# Patient Record
Sex: Female | Born: 1940
Health system: Southern US, Community
[De-identification: ages and names within clinical notes are randomized; demographics above are authoritative.]

## PROBLEM LIST (undated history)

## (undated) DIAGNOSIS — R1310 Dysphagia, unspecified: Secondary | ICD-10-CM

## (undated) DIAGNOSIS — I2510 Atherosclerotic heart disease of native coronary artery without angina pectoris: Secondary | ICD-10-CM

## (undated) DIAGNOSIS — R0902 Hypoxemia: Secondary | ICD-10-CM

## (undated) DIAGNOSIS — Z8739 Personal history of other diseases of the musculoskeletal system and connective tissue: Secondary | ICD-10-CM

## (undated) DIAGNOSIS — R519 Headache, unspecified: Secondary | ICD-10-CM

## (undated) DIAGNOSIS — I219 Acute myocardial infarction, unspecified: Secondary | ICD-10-CM

## (undated) DIAGNOSIS — R51 Headache: Secondary | ICD-10-CM

## (undated) DIAGNOSIS — I739 Peripheral vascular disease, unspecified: Secondary | ICD-10-CM

## (undated) DIAGNOSIS — R55 Syncope and collapse: Secondary | ICD-10-CM

## (undated) DIAGNOSIS — E7800 Pure hypercholesterolemia, unspecified: Secondary | ICD-10-CM

## (undated) DIAGNOSIS — I10 Essential (primary) hypertension: Secondary | ICD-10-CM

## (undated) DIAGNOSIS — N200 Calculus of kidney: Secondary | ICD-10-CM

## (undated) DIAGNOSIS — Z9981 Dependence on supplemental oxygen: Secondary | ICD-10-CM

## (undated) DIAGNOSIS — J42 Unspecified chronic bronchitis: Secondary | ICD-10-CM

## (undated) DIAGNOSIS — D649 Anemia, unspecified: Secondary | ICD-10-CM

## (undated) DIAGNOSIS — R198 Other specified symptoms and signs involving the digestive system and abdomen: Secondary | ICD-10-CM

## (undated) DIAGNOSIS — M069 Rheumatoid arthritis, unspecified: Secondary | ICD-10-CM

## (undated) DIAGNOSIS — I6522 Occlusion and stenosis of left carotid artery: Secondary | ICD-10-CM

## (undated) DIAGNOSIS — J189 Pneumonia, unspecified organism: Secondary | ICD-10-CM

## (undated) DIAGNOSIS — K219 Gastro-esophageal reflux disease without esophagitis: Secondary | ICD-10-CM

## (undated) HISTORY — DX: Other specified symptoms and signs involving the digestive system and abdomen: R19.8

## (undated) HISTORY — PX: DILATION AND CURETTAGE OF UTERUS: SHX78

## (undated) HISTORY — PX: TUBAL LIGATION: SHX77

## (undated) HISTORY — DX: Dysphagia, unspecified: R13.10

## (undated) HISTORY — PX: CATARACT EXTRACTION W/ INTRAOCULAR LENS  IMPLANT, BILATERAL: SHX1307

## (undated) HISTORY — PX: ILIAC ARTERY STENT: SHX1786

## (undated) HISTORY — DX: Headache, unspecified: R51.9

## (undated) HISTORY — PX: YAG LASER APPLICATION: SHX6189

## (undated) HISTORY — PX: CYSTOSCOPY: SUR368

## (undated) HISTORY — DX: Headache: R51

---

## 1997-10-12 HISTORY — PX: CORONARY ANGIOPLASTY WITH STENT PLACEMENT: SHX49

## 1998-09-29 ENCOUNTER — Encounter: Payer: Self-pay | Admitting: Emergency Medicine

## 1998-09-29 ENCOUNTER — Emergency Department (HOSPITAL_COMMUNITY): Admission: EM | Admit: 1998-09-29 | Discharge: 1998-09-29 | Payer: Self-pay | Admitting: Emergency Medicine

## 1998-10-12 HISTORY — PX: SUBCLAVIAN ARTERY STENT: SHX2452

## 1998-10-12 HISTORY — PX: CAROTID ENDARTERECTOMY: SUR193

## 1998-10-15 ENCOUNTER — Encounter: Payer: Self-pay | Admitting: Internal Medicine

## 1998-10-15 ENCOUNTER — Ambulatory Visit (HOSPITAL_COMMUNITY): Admission: RE | Admit: 1998-10-15 | Discharge: 1998-10-15 | Payer: Self-pay | Admitting: Internal Medicine

## 1998-10-22 ENCOUNTER — Other Ambulatory Visit: Admission: RE | Admit: 1998-10-22 | Discharge: 1998-10-22 | Payer: Self-pay | Admitting: Internal Medicine

## 1998-10-29 ENCOUNTER — Ambulatory Visit: Admission: RE | Admit: 1998-10-29 | Discharge: 1998-10-29 | Payer: Self-pay | Admitting: Cardiology

## 1998-11-13 ENCOUNTER — Observation Stay (HOSPITAL_COMMUNITY): Admission: AD | Admit: 1998-11-13 | Discharge: 1998-11-14 | Payer: Self-pay | Admitting: Cardiology

## 1998-12-05 ENCOUNTER — Observation Stay (HOSPITAL_COMMUNITY): Admission: RE | Admit: 1998-12-05 | Discharge: 1998-12-06 | Payer: Self-pay | Admitting: Cardiology

## 1998-12-17 ENCOUNTER — Encounter: Payer: Self-pay | Admitting: Internal Medicine

## 1998-12-17 ENCOUNTER — Ambulatory Visit (HOSPITAL_COMMUNITY): Admission: RE | Admit: 1998-12-17 | Discharge: 1998-12-17 | Payer: Self-pay | Admitting: Internal Medicine

## 1999-02-10 ENCOUNTER — Encounter: Payer: Self-pay | Admitting: Cardiology

## 1999-02-10 ENCOUNTER — Ambulatory Visit (HOSPITAL_COMMUNITY): Admission: RE | Admit: 1999-02-10 | Discharge: 1999-02-10 | Payer: Self-pay | Admitting: Cardiology

## 1999-03-27 ENCOUNTER — Inpatient Hospital Stay: Admission: RE | Admit: 1999-03-27 | Discharge: 1999-03-28 | Payer: Self-pay | Admitting: Vascular Surgery

## 1999-12-17 ENCOUNTER — Ambulatory Visit (HOSPITAL_COMMUNITY): Admission: RE | Admit: 1999-12-17 | Discharge: 1999-12-17 | Payer: Self-pay | Admitting: Internal Medicine

## 1999-12-17 ENCOUNTER — Encounter: Payer: Self-pay | Admitting: Internal Medicine

## 2000-02-24 ENCOUNTER — Encounter: Payer: Self-pay | Admitting: Cardiology

## 2000-02-24 ENCOUNTER — Inpatient Hospital Stay (HOSPITAL_COMMUNITY): Admission: EM | Admit: 2000-02-24 | Discharge: 2000-02-25 | Payer: Self-pay | Admitting: *Deleted

## 2000-07-06 ENCOUNTER — Other Ambulatory Visit: Admission: RE | Admit: 2000-07-06 | Discharge: 2000-07-06 | Payer: Self-pay | Admitting: Internal Medicine

## 2000-08-20 ENCOUNTER — Ambulatory Visit (HOSPITAL_COMMUNITY): Admission: RE | Admit: 2000-08-20 | Discharge: 2000-08-20 | Payer: Self-pay | Admitting: Gastroenterology

## 2000-11-24 ENCOUNTER — Encounter: Admission: RE | Admit: 2000-11-24 | Discharge: 2000-11-24 | Payer: Self-pay | Admitting: Occupational Medicine

## 2000-11-24 ENCOUNTER — Encounter: Payer: Self-pay | Admitting: Occupational Medicine

## 2000-12-21 ENCOUNTER — Ambulatory Visit (HOSPITAL_COMMUNITY): Admission: RE | Admit: 2000-12-21 | Discharge: 2000-12-21 | Payer: Self-pay | Admitting: Internal Medicine

## 2000-12-21 ENCOUNTER — Encounter: Payer: Self-pay | Admitting: Internal Medicine

## 2002-01-09 ENCOUNTER — Other Ambulatory Visit: Admission: RE | Admit: 2002-01-09 | Discharge: 2002-01-09 | Payer: Self-pay | Admitting: Internal Medicine

## 2002-01-09 ENCOUNTER — Ambulatory Visit (HOSPITAL_COMMUNITY): Admission: RE | Admit: 2002-01-09 | Discharge: 2002-01-09 | Payer: Self-pay | Admitting: Internal Medicine

## 2002-01-09 ENCOUNTER — Encounter: Payer: Self-pay | Admitting: Internal Medicine

## 2003-01-16 ENCOUNTER — Encounter: Payer: Self-pay | Admitting: Internal Medicine

## 2003-01-16 ENCOUNTER — Ambulatory Visit (HOSPITAL_COMMUNITY): Admission: RE | Admit: 2003-01-16 | Discharge: 2003-01-16 | Payer: Self-pay | Admitting: Internal Medicine

## 2003-05-23 ENCOUNTER — Emergency Department (HOSPITAL_COMMUNITY): Admission: EM | Admit: 2003-05-23 | Discharge: 2003-05-23 | Payer: Self-pay | Admitting: Emergency Medicine

## 2004-01-30 ENCOUNTER — Ambulatory Visit (HOSPITAL_COMMUNITY): Admission: RE | Admit: 2004-01-30 | Discharge: 2004-01-30 | Payer: Self-pay | Admitting: Internal Medicine

## 2004-02-18 ENCOUNTER — Other Ambulatory Visit: Admission: RE | Admit: 2004-02-18 | Discharge: 2004-02-18 | Payer: Self-pay | Admitting: Internal Medicine

## 2004-03-18 ENCOUNTER — Ambulatory Visit (HOSPITAL_COMMUNITY): Admission: RE | Admit: 2004-03-18 | Discharge: 2004-03-18 | Payer: Self-pay | Admitting: Orthopedic Surgery

## 2004-03-18 ENCOUNTER — Ambulatory Visit (HOSPITAL_BASED_OUTPATIENT_CLINIC_OR_DEPARTMENT_OTHER): Admission: RE | Admit: 2004-03-18 | Discharge: 2004-03-18 | Payer: Self-pay | Admitting: Orthopedic Surgery

## 2005-02-05 ENCOUNTER — Ambulatory Visit (HOSPITAL_COMMUNITY): Admission: RE | Admit: 2005-02-05 | Discharge: 2005-02-05 | Payer: Self-pay | Admitting: Internal Medicine

## 2006-02-08 ENCOUNTER — Ambulatory Visit (HOSPITAL_COMMUNITY): Admission: RE | Admit: 2006-02-08 | Discharge: 2006-02-08 | Payer: Self-pay | Admitting: Internal Medicine

## 2007-02-15 ENCOUNTER — Ambulatory Visit (HOSPITAL_COMMUNITY): Admission: RE | Admit: 2007-02-15 | Discharge: 2007-02-15 | Payer: Self-pay | Admitting: Cardiology

## 2007-05-31 ENCOUNTER — Encounter: Admission: RE | Admit: 2007-05-31 | Discharge: 2007-05-31 | Payer: Self-pay | Admitting: Internal Medicine

## 2007-10-13 HISTORY — PX: FEMORAL ARTERY STENT: SHX1583

## 2008-02-28 ENCOUNTER — Ambulatory Visit (HOSPITAL_COMMUNITY): Admission: RE | Admit: 2008-02-28 | Discharge: 2008-02-28 | Payer: Self-pay | Admitting: Internal Medicine

## 2008-05-15 ENCOUNTER — Ambulatory Visit (HOSPITAL_COMMUNITY): Admission: RE | Admit: 2008-05-15 | Discharge: 2008-05-16 | Payer: Self-pay | Admitting: Cardiology

## 2009-02-06 ENCOUNTER — Other Ambulatory Visit: Admission: RE | Admit: 2009-02-06 | Discharge: 2009-02-06 | Payer: Self-pay | Admitting: Internal Medicine

## 2009-02-18 ENCOUNTER — Ambulatory Visit (HOSPITAL_COMMUNITY): Admission: EM | Admit: 2009-02-18 | Discharge: 2009-02-18 | Payer: Self-pay | Admitting: Emergency Medicine

## 2009-03-06 ENCOUNTER — Ambulatory Visit (HOSPITAL_COMMUNITY): Admission: RE | Admit: 2009-03-06 | Discharge: 2009-03-06 | Payer: Self-pay | Admitting: Internal Medicine

## 2009-05-17 ENCOUNTER — Ambulatory Visit: Payer: Self-pay | Admitting: Vascular Surgery

## 2009-12-06 ENCOUNTER — Ambulatory Visit: Payer: Self-pay | Admitting: Vascular Surgery

## 2010-03-12 ENCOUNTER — Ambulatory Visit (HOSPITAL_COMMUNITY): Admission: RE | Admit: 2010-03-12 | Discharge: 2010-03-12 | Payer: Self-pay | Admitting: Internal Medicine

## 2010-08-14 ENCOUNTER — Ambulatory Visit: Payer: Self-pay | Admitting: Vascular Surgery

## 2011-02-06 ENCOUNTER — Other Ambulatory Visit (HOSPITAL_COMMUNITY): Payer: Self-pay | Admitting: Internal Medicine

## 2011-02-06 DIAGNOSIS — Z1231 Encounter for screening mammogram for malignant neoplasm of breast: Secondary | ICD-10-CM

## 2011-02-24 NOTE — Consult Note (Signed)
NEW PATIENT CONSULTATION   [name], [name]  DOB:  17-May-1941                                       05/17/2009  ZOXWR#:[id number]   The patient presents today for evaluation of asymptomatic carotid  stenosis.  She is well known to me from a prior left carotid  endarterectomy in 2000.  She has had several followup duplex scans in  the past showing moderate stenosis in her left endarterectomy site.  We  are seeing her for further discussion of this.  Most recently, duplex at  [facility] on 02/26/2009 showed a 60%-79% stenosis in her left  carotid.  She denies any focal neurologic deficits.  She does report  some right arm fatigue over the past month, but no focal deficits  related to this.  She does have a long history of peripheral vascular  disease with left iliac artery stenting.  Does have a history of prior  myocardial infarction, hypertension, elevated cholesterol.   FAMILY HISTORY:  Significant for sudden death in her father at age 59.   SOCIAL HISTORY:  She is widowed.  She is a retired Engineer, civil (consulting).  She quit  smoking in 2000.  Does not drink alcohol.   REVIEW OF SYSTEMS:  GENERAL:  Positive for weight gain up to 155 pounds.  She is 5 feet 4 inches tall.  She does have home oxygen at night.  GI:  Is positive for esophageal reflux and hiatal hernia.  ORTHOPEDICS:  For arthritis.  EYES:  She has had cataract surgery in the past.   She has multiple medication allergies and multiple medications, which  are listed in her chart.   PHYSICAL EXAM:  Well-developed white female appearing stated age of 70.  Blood pressure is 114/70 in her left arm and 113/65 in her right arm.  Pulse 78, respirations 18.  She has a well-healed left carotid incision  and she does have soft carotid bruits bilaterally.  She has palpable  radial pulses and she is grossly intact neurologically.   Hopkins reviewed her duplex with her.  Hopkins explained that since she is  asymptomatic we would  recommend followup only and plan to see her again  in 6 months for repeat carotid duplex at that time.  She will notify us  immediately should she develop focal neurologic deficits.   Larina Earthly, M.D.  Electronically Signed   TFE/MEDQ  D:  05/17/2009  T:  05/20/2009  Job:  [id number]   cc:   Massie Maroon, MD

## 2011-02-24 NOTE — Procedures (Signed)
CAROTID DUPLEX EXAM   INDICATION:  Carotid disease.   HISTORY:  Diabetes:  No.  Cardiac:  No.  Hypertension:  Yes.  Smoking:  Previous.  Previous Surgery:  Left carotid endarterectomy in 2000.  CV History:  Currently asymptomatic.  Amaurosis Fugax No, Paresthesias No, Hemiparesis No                                       RIGHT             LEFT  Brachial systolic pressure:         126               102  Brachial Doppler waveforms:         Normal            Atypical  Vertebral direction of flow:        Antegrade         Hesitant  DUPLEX VELOCITIES (cm/sec)  CCA peak systolic                   72                P=87/D=238  ECA peak systolic                   114               75  ICA peak systolic                   74                257  ICA end diastolic                   22                56  PLAQUE MORPHOLOGY:                  Mixed             Homogeneous  PLAQUE AMOUNT:                      Mild              Moderate / severe  PLAQUE LOCATION:                    ICA / ECA / CCA   Distal CCA   IMPRESSION:  1. 1%-39% stenosis of the right internal carotid artery.  2. Increased velocities noted in the left proximal internal carotid      artery (as described above), however, this appears to be a result      of the significant stenosis of the proximal carotid endarterectomy      site in the left distal common carotid artery.  3. Significant difference in the bilateral brachial pressures noted      with a hesitant left vertebral artery waveform. This is suggestive      of a possible left subclavian artery stenosis.   ___________________________________________  Larina Earthly, M.D.   CH/MEDQ  D:  12/06/2009  T:  12/06/2009  Job:  [id number]

## 2011-02-24 NOTE — Procedures (Signed)
CAROTID DUPLEX EXAM   INDICATION:  Carotid stenosis.   HISTORY:  Diabetes:  No.  Cardiac:  No.  Hypertension:  Yes.  Smoking:  Previously.  Previous Surgery:  Left carotid endarterectomy in 2000.  CV History:  Currently asymptomatic.                                        RIGHT             LEFT  Brachial systolic pressure:         122               107  Brachial Doppler waveforms:         Normal            Normal  Vertebral direction of flow:        Antegrade         Atypical  DUPLEX VELOCITIES (cm/sec)  CCA peak systolic                   83                M = 80, D = 280  ECA peak systolic                   97                60  ICA peak systolic                   80                121  ICA end diastolic                   27                26  PLAQUE MORPHOLOGY:                  Mixed             Homogenous  PLAQUE AMOUNT:                      Mild              Moderate/severe  PLAQUE LOCATION:                    ICA               CCA, bifurcation   IMPRESSION:  1. Right internal carotid artery velocities suggest 1% to 39%      stenosis.  2. Left internal carotid artery velocities suggest 1% to 39% stenosis.  3. Left distal common carotid artery stenosis.  4. Left vertebral artery early systolic deceleration.       ___________________________________________  Larina Earthly, M.D.   EM/MEDQ  D:  08/14/2010  T:  08/14/2010  Job:  [id number]

## 2011-02-24 NOTE — Cardiovascular Report (Signed)
NAMECRISTYN, Hopkins                ACCOUNT NO.:  [id number]   MEDICAL RECORD NO.:  [id number]          PATIENT TYPE:  OIB   LOCATION:  2622                         FACILITY:  [facility]   PHYSICIAN:  Cristy Hilts. Jacinto Halim, MD       DATE OF BIRTH:  Feb 02, 1941   DATE OF PROCEDURE:  05/15/2008  DATE OF DISCHARGE:                            CARDIAC CATHETERIZATION   PROCEDURE PERFORMED:  1. Pelvic aortogram.  2. Pelvic aortogram with bifemoral runoff.  3. PTA and stenting of the left common iliac artery.   INDICATIONS:  [name] is a 70 year old female with known  peripheral arterial disease and coronary artery disease.  She has  undergone remote stenting to her right SFA in the past and balloon  angioplasty of the lower extremities in the past, details of which are  not fully available to me.  She has been complaining of lifestyle-  limiting left hip claudication.  She had undergone outpatient Doppler  evaluation of the lower extremities and this had revealed a possibility  of high-grade stenosis of the left common iliac artery.  Hence, she was  referred to me by Dr. Krista Blue for evaluation of peripheral arterial  disease with possible eye towards percutaneous revascularization.   Pelvic aortogram:  Pelvic aortogram revealed severe atherosclerotic  changes of the abdominal aorta.  There was moderate to heavy  calcification of the abdominal aorta and also bilateral common iliac  arteries.   The left iliac artery showed a high-grade 80% to 90% calcific stenosis.  There is mild luminal irregularity of the left external iliac artery and  the left common femoral artery and superficial femoral artery were  within normal limits.  Below the left knee, there was three-vessel  runoff without any evidence of small-vessel disease.   On the right side, right common iliac artery showed ostial 30%-40%  stenosis and mild luminal irregularity of right common iliac artery.   The right femoral artery and  below the right knee there was three-vessel  runoff and right femoral artery was also normal and previously placed  stent in the right SFA just outside of the Hunter's canal is widely  patent.   INTERVENTIONAL DATA:  Successful PTA and stenting of the left common  iliac artery with implantation of 8.0 x 39 mm Smart balloon expandable  stent, which was deployed at 10 atmospheric pressure.  This stenosis was  reduced from 90% to 0% with excellent results.  There was a retrograde  dissection noted at the distal abdominal aorta, which is  inconsequential.   Careful pullback was performed through the abdominal aorta into the left  femoral artery and there was no pressure gradient noted across the stent  or distal to the stent.   A total of 190 mL of contrast was utilized for diagnostic and  interventional procedure.   RECOMMENDATIONS:  The patient will need a Plavix for at least a period  of 4 to 6 weeks and aspirin indefinitely.  She will follow up with Dr.  Krista Blue for further management and evaluation in 2 to 3 weeks.   TECHNIQUE  OF THE PROCEDURE:  Under usual sterile precautions using a 5-  French left femoral arterial access, a 5-French Omniflush catheter was  advanced into the left common iliac artery.  I had difficulty in  crossing the common iliac artery stenosis.  Hence a femoral arteriogram  was performed through the arterial access sheath.  Then with careful  manipulation of the catheter, I was able to cross through the high-grade  calcific stenosis of the left common iliac artery and placed the [facility]  wire in the abdominal aorta.  Distal abdominal aortogram was performed  and distal abdominal aortogram with bifemoral runoff was also performed.   TECHNIQUE OF INTERVENTION:  Using heparin for anticoagulation  maintaining ACT greater than 200, I exchanged 5-French sheath to a 7-  French left femoral arterial access sheath.  Then, the lesion was  predilated because of inability  to cross the stenosis with the  introducer sheath.  I had to predilate this with a 6.0 x 40-mm Fox cross  balloon at 12 atmospheric pressure for 50 seconds.  Following this, I  was able to easily advance the 45-cm long sheath into the distal  abdominal aorta.  Distal abdominal aortogram was performed.  Then after  carefully mapping the landmark an 8.0 x 39-mm Genesis balloon expandable  stent was placed at the appropriate position and deployed at 10  atmospheric pressure.  Angiography was repeated and then distal  abdominal aortogram was repeated and the catheters were pulled out of  body in the usual fashion.  The long sheath was pulled out and exchanged  to a short sheath and left femoral arterial access site and the patient  was transferred to the holding area in stable in stable condition.      Cristy Hilts. Jacinto Halim, MD  Electronically Signed     JRG/MEDQ  D:  05/15/2008  T:  05/16/2008  Job:  [id number]

## 2011-02-24 NOTE — Op Note (Signed)
NAMEDECLAN, [name]                ACCOUNT NO.:  [id number]   MEDICAL RECORD NO.:  [id number]          PATIENT TYPE:  OBV   LOCATION:  0105                         FACILITY:  [facility]   PHYSICIAN:  Petra Kuba, M.D.    DATE OF BIRTH:  August 02, 1941   DATE OF PROCEDURE:  02/18/2009  DATE OF DISCHARGE:                               OPERATIVE REPORT   PROCEDURE PERFORMED:  Esophagogastroduodenoscopy with food disimpaction.   ENDOSCOPIST:  Petra Kuba, M.D.   INDICATIONS FOR PROCEDURE:  Patient with obvious food impaction.  Consent was signed after the risks, benefits, methods and options were  thoroughly discussed multiple times in the past.   MEDICINES USED:  Fentanyl 100 mg, Versed 10 mg.   DESCRIPTION OF PROCEDURE:  The video endoscope was inserted by direct  vision.  In the proximal esophagus was some obvious food.  We initially  tried to get the Lucina Mellow net around it, but could not advance it around the  sides, little pieces were removed.  Frequently, we would have bigger  pieces, but when we tried to pull it through the upper esophageal  sphincter it would break into smaller pieces.  We then switched to the  snare and again small pieces were removed.  Multiple insertions and  removals were done.  We then switched to the 40-pronged grabber but  again met the same problem as we would grab the large piece, it would  break into smaller pieces in pulling it through the upper esophageal  sphincter.  We did try to switch back to the [facility] net again, with only  being able to remove small pieces.  When we switched back to the 4  pronged grabber in trying to grab a piece, it seemed to push it down  slightly into the esophagus and then the bolus was able to fall on its  own into the stomach.  The scope followed the bolus into the stomach.  There was trauma at the proximal to mid esophagus where the food was,  but no obvious stricture or ring.  She did have a small hiatal hernia.  Scope passed  into the stomach and there was no extra water or food  there, and advanced to a normal antrum and normal pylorus and advanced  into a normal duodenal bulb and around the C-loop to a normal second  portion of the duodenum.  The scope was withdrawn back to the bulb and a  good look there ruled out abnormalities in that location.  Scope was  withdrawn back to the stomach and retroflexed.  High in the cardia the  hiatal hernia was confirmed.  The angularis, lesser and greater curve,  cardia and fundus were normal on retroflex visualization.  Straight  visualization of the stomach did not reveal any additional findings.  Again a good look at the esophagus confirmed there was no more food and  did confirm some mild trauma in the proximal to mid esophagus.  The  scope was removed.  The patient tolerated the procedure adequately.  There were no obvious immediate complication.   ENDOSCOPIC  DIAGNOSES:  1. Food impaction in the mid to proximal esophagus, status post      multiple pieces removed with the snare of the [employer] and 4-      Paramedic.  2. Finally dislodged the bolus with the 4-prong and down to the      stomach.  3. Small hiatal hernia.  4. Otherwise normal esophagogastroduodenoscopy.   PLAN:  Clear liquids and then advance to soft solids tomorrow.  Hold  nonsteroidals for a few days but okay to continue her aspirin and will  get some over-the-counter Prilosec and tell them to take it twice a day  for 5 days and follow up p.r.n.           ______________________________  Petra Kuba, M.D.     MEM/MEDQ  D:  02/18/2009  T:  02/19/2009  Job:  [id number]   cc:   Antionette Char, MD  Fax: [phone]

## 2011-02-27 NOTE — Discharge Summary (Signed)
NAMEMARCO, [name]                ACCOUNT NO.:  [id number]   MEDICAL RECORD NO.:  [id number]          PATIENT TYPE:  OIB   LOCATION:  2622                         FACILITY:  [facility]   PHYSICIAN:  Cristy Hilts. Jacinto Halim, MD       DATE OF BIRTH:  Aug 17, 1941   DATE OF ADMISSION:  05/15/2008  DATE OF DISCHARGE:  05/16/2008                               DISCHARGE SUMMARY   DISCHARGE DIAGNOSES:  1. Peripheral vascular disease.      a.     Percutaneous transluminal angioplasty and stenting of high-       grade calcific common left iliac artery.  2. Claudication in left hip.  3. Abnormal Doppler studies.  4. History of coronary artery disease.  5. History of carotid artery disease.  6. Hyperlipidemia.  7. Osteopenia.   DISCHARGE CONDITION:  Improved.   PROCEDURES:  PV angiogram of lower extremities on May 15, 2008, by Dr.  Yates Decamp.   On May 15, 2008, percutaneous transluminal angioplasty and stent  deployment to the left iliac artery secondary to stenosis by Dr. Jacinto Halim.   DISCHARGE MEDICATIONS:  1. Lescol XL 80 mg daily.  2. Cozaar 50 mg daily.  3. Zebeta 5 mg daily.  4. Evista 60 mg daily.  5. Enteric-coated aspirin 2 daily.  6. Vitamin for her eye 1 daily.  7. Calcium citrate and vitamin D 600 mg twice a day.  8. Fish oil 1000 mg 2 tablets at bedtime.  9. Tylenol 500 mg as needed.  10.Plavix 75 mg daily.   DISCHARGE INSTRUCTIONS:  1. Follow up with Dr. Aleen Campi in 2 weeks.  Call for an appointment.  2. Increase activity slowly.  3. May shower.  No lifting for 3 days.  4. No driving for 3 days.   HISTORY OF PRESENT ILLNESS:  A 70 year old patient of Dr. Aleen Campi had  complained of claudication.  She underwent ultrasound on May 17, 2008,  showing moderate-to-severe stenosis in the left common iliac artery.  Dr. Aleen Campi consulted with Dr. Yates Decamp and felt that PV angiogram and  stent would help alleviate her symptoms.   The patient's past medical history does include:  1.  Coronary disease.  2. Hyperlipidemia.  3. Osteopenia.   The patient was brought to the [facility] at [facility] on May 15, 2008,  and underwent peripheral arteriogram and PTA and stent to the left iliac  artery successfully without complications.  By May 16, 2008, she was  stable and ready for discharge home.  Blood pressure 112/62, pulse 76,  respiratory 12, sats on room air 96%.  She will follow up with Dr.  Aleen Campi.   Please note, I am dictating this for Dr. Jacinto Halim and Raymon Mutton, PA,  secondary to records were not yet completed, and [name] is no  longer available for the dictation.      Darcella Gasman. [place], N.P.      Cristy Hilts. Jacinto Halim, MD  Electronically Signed    LRI/MEDQ  D:  09/27/2008  T:  09/28/2008  Job:  [id number]   cc:   Jonny Ruiz  Felipe Drone, MD

## 2011-02-27 NOTE — H&P (Signed)
[facility]  Patient:    [name], [name]                   MRN: [id number] Adm. Date:  [id number] Attending:  Silvestre Mesi Dictator:   Donzetta Matters, P.A.                         History and Physical  CHIEF COMPLAINT: Jaw pain.  HISTORY OF PRESENT ILLNESS: This patient is a 70 year old female who states she has had nausea and pain today, this a toothache-type pain that is almost constant and associated with activity and ache of the left arm.  She feels squeezing mid chest pain down the sternum.  It does come and go.  It radiates to the left neck, jaw, and shoulder.  She has moderate chest pain.  She did have an instance of pain with nausea while driving here today and states the pain was bad for about ten minutes.  It has awakened her at night several times.  She had one spell similar to this on Sunday.  She states it is similar to previous angina.  She does have a history of a stent in her right coronary artery but states this was not as severe like prior to her catheterization. She did take aspirin and the pain went away for a while.  FAMILY HISTORY: Father died at age 31 of MI.  Mother is ill with diabetes and complications.  SOCIAL HISTORY: She is a widow.  She does drink caffeine.  She is a former smoker now.  PAST MEDICAL HISTORY:  1. Peripheral vascular disease.  2. Coronary artery disease.  3. Carotid artery with endarterectomy.  4. Status post MI.  5. History of smoking.  6. Hard of hearing.  7. Does have arthritis.  REVIEW OF SYSTEMS: She has no fever or chills.  She has had some sweats at night.  Weight has increased some.  She has noted some edema to her hands. Chest pain as noted in the HPI.  No palpitations.  No dyspnea.  She does sleep on three pillows.  She does have some cough but no sputum production.  She does have some occasional nausea on the date of admission.  Some heartburn. She does have constipation at  times.  She denies any urinary problems.  She does get up one time at night for nocturia.  She does have some fatigue but gait is steady.  She denies any dizziness, syncope, or faintness.  No memory changes.  No paresthesias except for today in the left arm.  PHYSICAL EXAMINATION:  VITAL SIGNS: Weight 150 pounds.  Blood pressure 148/68, pulse 64, respirations 16.  She is afebrile.  GENERAL: Well-developed, well-nourished white female, at the present time with good nutrition.  Well cared for.  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  She is oriented to person, place, time, and situation, with good judgment.  NECK: Does have right bruit 2-3/6.  No JVD.  No thyromegaly.  CHEST: Clear.  HEART: Regular rate and rhythm, no obvious murmurs.  ABDOMEN: Soft, flat, active bowel sounds, nontender.  No abdominal bruits.  HEENT: PERRL.  EOMI.  Does have prescription lenses.  Does have partial upper and lower dentures in.  Otherwise pink and moist mucosa noted.  NEUROLOGIC: Grossly nonfocal.  EXTREMITIES: No edema, normal pedal pulses.  No clubbing or cyanosis or rash noted to the skin.  IMPRESSION: Admission to telemetry at [facility] to start on anticoagulation and antiplatelet therapy.  Nitroglycerin drip.  Obtain serial enzymes and schedule for heart catheterization in the morning.  DIAGNOSES:  1. Coronary artery disease with unstable angina.  2. Peripheral vascular disease. DD:  02/25/00 TD:  02/25/00 Job: [id number]

## 2011-02-27 NOTE — Op Note (Signed)
NAME:  [name], [name]                          ACCOUNT NO.:  [id number]   MEDICAL RECORD NO.:  [id number]                   PATIENT TYPE:  AMB   LOCATION:  DSC                                  FACILITY:  [facility]   PHYSICIAN:  Cindee Salt, M.D.                    DATE OF BIRTH:  20-Apr-1941   DATE OF PROCEDURE:  03/18/2004  DATE OF DISCHARGE:                                 OPERATIVE REPORT   PREOPERATIVE DIAGNOSIS:  Cat bite with infection, left forearm.   POSTOPERATIVE DIAGNOSIS:  Cat bite with infection, left forearm.   OPERATION:  Incision and drainage, left forearm.   SURGEON:  Cindee Salt, M.D.   ASSISTANTCarolyne Fiscal.   ANESTHESIA:  General.   HISTORY:  The patient is a 69 year old female who suffered a cat bite  approximately two days ago.  She has been placed on Augmentin.  This has  continued to increase in erythema and pain.   PROCEDURE:  The patient was brought to the operating room, where a general  anesthetic was carried out without difficulty.  She was prepped using  Duraprep in supine position, left arm free.  The limb was elevated for  exsanguination, tourniquet placed high, and the arm was inflated to 250  mmHg.  The incisions were made over each of the puncture wounds.  Cultures  were taken.  These were carried down to the depths.  This was then irrigated  clear.  Each was packed open.  A sterile compressive dressing was applied.  The patient tolerated the procedure well and was taken to the recovery room  for observation in satisfactory condition.  She was given 3 g of Unasyn in  the operating room, and she is discharged on Augmentin and Vicodin.  She  will return in two days for therapy.                                               Cindee Salt, M.D.    Angelique Blonder  D:  03/18/2004  T:  03/19/2004  Job:  [id number]

## 2011-02-27 NOTE — Cardiovascular Report (Signed)
[place]  Patient:    [name], [name]                   MRN: [id number] Proc. Date: 02/25/00 Adm. Date:  [id number] Disc. Date: [id number] Attending:  Silvestre Mesi CC:         Jaclyn Prime. Lucas Mallow, M.D.             John R. Aleen Campi, M.D.                        Cardiac Catheterization  REFERRING PHYSICIAN:  Jaclyn Prime. Lucas Mallow, M.D.  PROCEDURES: 1. Left heart catheterization. 2. Coronary cineangiography. 3. Right and left subclavian artery cineangiography. 4. Left ventricular cineangiography. 5. Perclose of the right femoral artery.  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  She had an emergency angioplasty with stent placement in the proximal right coronary artery.  She was also noted to have mild to moderate stenosis in her right subclavian artery, and severe stenosis in her left proximal subclavian artery. She later had stent placement in her left subclavian lesion.  She now returns with hospitalization on Feb 24, 2000 for chest pain, and was scheduled for repeat cardiac catheterization to assess her coronary status.  DESCRIPTION OF PROCEDURE:  After signing an informed consent, the patient was premedicated with 50 mg of Benadryl intravenously and brought to the cardiac catheterization lab.  The right groin was prepped and draped in a sterile fashion, and anesthetized locally with 1% lidocaine.  Six-French introducer sheath was inserted percutaneously into the right femoral artery.  Then, 6-French #4 Judkins coronary catheters were used to make injections into the coronary arteries.  The right coronary catheter was used to make injections into the right subclavian and left subclavian arteries.  A 6-French pigtail catheter was used to measure pressures in the left  ventricle and aorta, and to make midstream injections into the left ventricle.  The patient tolerated the procedure well and no complications were noted.  At the end of the procedure. The catheters and sheath were removed from the right femoral artery, and hemostasis was easily obtained with a Perclose closure system.  MEDICATIONS GIVEN:  Versed 1 mg IV x 2.  HEMODYNAMIC DATA: 1. Left ventricular pressure:  144/0-17. 2. Aortic pressure:  143/61, with a mean of 94. 3. Left ventricular ejection fraction:  Estimated at 60-70%.  CINEANGIOGRAPHIC FINDINGS: 1. Left Coronary Artery:  The ostium and left main appear normal. 2. Left Anterior Descending:  Appears normal. 3. Circumflex Coronary Artery:  Appears normal. This is a small, nondominant    vessel. 4. Right Coronary Artery:  A large dominant vessel supplying the posterior    descending and posterolateral circulation.  The ostium appears normal.    The proximal segment is well visualized, showing the stented area, and now    appears to be essentially normal with a very normal appearance during the    contrast injections; and with very normal antegrade flow throughout the    right coronary system.  There is a mild stenotic plaque in the middle    segment before the acute angle, with a less than 20% stenosis.  This    appears smooth and unchanged from her prior study in February 2001.  LEFT SUBCLAVIAN CINEANGIOGRAPHY:  Shows proper positioning of  the stent in the proximal segment, now with a normal appearance and 0% restenosis.  There is very normal flow into the distal subclavian.  The left vertebral artery has a 60-70% ostial lesion.  The left internal mammary artery appears normal.  The right subclavian has a focal eccentric stenosis in its proximal segment. There is mild post-stenotic dilatation, followed by normal-appearing mid and distal subclavian on the right.  The proximal common carotid artery appears normal, and the  innominate artery appears normal.  There is a calcified plaque in the proximal right subclavian.  LEFT VENTRICULAR CINEANGIOGRAM:  The left ventricular chamber size and contractility appear normal.  The left ventricular contractility was estimated at 60-70%.  The mitral and aortic valves appear normal.  FINAL DIAGNOSES: 1. History of single-vessel coronary artery disease. 2. Very good long-term appearance of the proximal right coronary artery stent    site, and no new coronary lesions. 3. Normal left coronary artery. 4. Very good appearance of the left subclavian stent. 5. Normal left ventricular function. 6. Normal mitral and aortic valves. 7. Successful Perclose of the right femoral artery.   DISPOSITION:  Will transfer the patient to the short-stay unit and anticipate discharge later today.  Will not continue the heparin or nitroglycerin drip. DD:  02/25/00 TD:  02/26/00 Job: [id number]

## 2011-03-17 ENCOUNTER — Ambulatory Visit (HOSPITAL_COMMUNITY)
Admission: RE | Admit: 2011-03-17 | Discharge: 2011-03-17 | Disposition: A | Discharge status: Home / Self Care | Payer: Medicare Other | Source: Ambulatory Visit | Attending: Internal Medicine | Admitting: Internal Medicine

## 2011-03-17 DIAGNOSIS — Z1231 Encounter for screening mammogram for malignant neoplasm of breast: Secondary | ICD-10-CM | POA: Insufficient documentation

## 2011-07-10 LAB — CBC
MCHC: 34
Platelets: 327
RBC: 4.07
WBC: 6.9

## 2011-07-10 LAB — BASIC METABOLIC PANEL
BUN: 9
Calcium: 9
Creatinine, Ser: 0.74
GFR calc Af Amer: >60

## 2011-12-03 DIAGNOSIS — I70219 Atherosclerosis of native arteries of extremities with intermittent claudication, unspecified extremity: Secondary | ICD-10-CM | POA: Diagnosis not present

## 2011-12-03 DIAGNOSIS — I2510 Atherosclerotic heart disease of native coronary artery without angina pectoris: Secondary | ICD-10-CM | POA: Diagnosis not present

## 2011-12-03 DIAGNOSIS — Z9861 Coronary angioplasty status: Secondary | ICD-10-CM | POA: Diagnosis not present

## 2011-12-03 DIAGNOSIS — I10 Essential (primary) hypertension: Secondary | ICD-10-CM | POA: Diagnosis not present

## 2011-12-20 ENCOUNTER — Emergency Department (INDEPENDENT_AMBULATORY_CARE_PROVIDER_SITE_OTHER)
Admission: EM | Admit: 2011-12-20 | Discharge: 2011-12-20 | Disposition: A | Discharge status: Home / Self Care | Payer: Medicare Other | Source: Home / Self Care | Attending: Family Medicine | Admitting: Family Medicine

## 2011-12-20 ENCOUNTER — Encounter (HOSPITAL_COMMUNITY): Payer: Self-pay

## 2011-12-20 ENCOUNTER — Emergency Department (INDEPENDENT_AMBULATORY_CARE_PROVIDER_SITE_OTHER): Payer: Medicare Other

## 2011-12-20 DIAGNOSIS — M76899 Other specified enthesopathies of unspecified lower limb, excluding foot: Secondary | ICD-10-CM

## 2011-12-20 DIAGNOSIS — M25559 Pain in unspecified hip: Secondary | ICD-10-CM | POA: Diagnosis not present

## 2011-12-20 DIAGNOSIS — M7072 Other bursitis of hip, left hip: Secondary | ICD-10-CM

## 2011-12-20 HISTORY — DX: Essential (primary) hypertension: I10

## 2011-12-20 HISTORY — DX: Pure hypercholesterolemia, unspecified: E78.00

## 2011-12-20 HISTORY — DX: Atherosclerotic heart disease of native coronary artery without angina pectoris: I25.10

## 2011-12-20 MED ORDER — TRAMADOL HCL 50 MG PO TABS: 50.0000 mg | ORAL_TABLET | Freq: Four times a day (QID) | ORAL | Status: AC | PRN

## 2011-12-20 NOTE — ED Provider Notes (Signed)
History     CSN: [id number]  Arrival date & time 12/20/11  1331   First MD Initiated Contact with Patient 12/20/11 1334      Chief Complaint  Patient presents with  . Hip Pain    (Consider location/radiation/quality/duration/timing/severity/associated sxs/prior treatment) Patient is a 71 y.o. female presenting with hip pain. The history is provided by the patient.  Hip Pain This is a new problem. The current episode started more than 1 week ago (for 1 month , sx gradually getting worse.). The problem has been gradually worsening. Exacerbated by: worse getting up and down.    Past Medical History  Diagnosis Date  . Coronary artery disease   . Hypercholesteremia   . Hypertension     Past Surgical History  Procedure Date  . Tubal ligation   . Angioplasty   . Carotid endarterectomy   . Eye surgery     History reviewed. No pertinent family history.  History  Substance Use Topics  . Smoking status: Never Smoker   . Smokeless tobacco: Not on file  . Alcohol Use: No    OB History    Grav Para Term Preterm Abortions TAB SAB Ect Mult Living                  Review of Systems  Constitutional: Negative.   Gastrointestinal: Negative.   Genitourinary: Negative.   Musculoskeletal: Positive for arthralgias.    Allergies  Altace; Ivp dye; Macrodantin; Mobic; Motrin; Naprosyn; Neosporin; Niaspan; Pletal; Prednisone; Sulfa antibiotics; and Vioxx  Home Medications   Current Outpatient Rx  Name Route Sig Dispense Refill  . PROMISEB EX CREA Apply externally Apply topically.    . ASPIRIN 81 MG PO TABS Oral Take 81 mg by mouth daily.    Marland Kitchen BISOPROLOL FUMARATE 5 MG PO TABS Oral Take 5 mg by mouth daily.    Marland Kitchen CALTRATE 600 PO Oral Take by mouth.    . EZETIMIBE 10 MG PO TABS Oral Take 10 mg by mouth daily.    Marland Kitchen FLUVASTATIN SODIUM ER 80 MG PO TB24 Oral Take 80 mg by mouth daily.    Marland Kitchen LOSARTAN POTASSIUM 50 MG PO TABS Oral Take 50 mg by mouth daily.    . ICAPS MV PO Oral Take  by mouth.    Marland Kitchen FISH OIL 1000 MG PO CPDR Oral Take by mouth.    Marland Kitchen RALOXIFENE HCL 60 MG PO TABS Oral Take 60 mg by mouth daily.    Marland Kitchen VITAMIN C 250 MG PO TABS Oral Take 250 mg by mouth daily.    Marland Kitchen VITAMIN D (ERGOCALCIFEROL) 50000 UNITS PO CAPS Oral Take 50,000 Units by mouth.    . TRAMADOL HCL 50 MG PO TABS Oral Take 1 tablet (50 mg total) by mouth every 6 (six) hours as needed for pain. 20 tablet 0    BP 134/71  Pulse 63  Temp(Src) 98.4 F (36.9 C) (Oral)  Resp 16  SpO2 95%  Physical Exam  Nursing note and vitals reviewed. Constitutional: She is oriented to person, place, and time. She appears well-developed and well-nourished.  Abdominal: Soft. Bowel sounds are normal.  Musculoskeletal: She exhibits tenderness. She exhibits no edema.       Legs: Neurological: She is alert and oriented to person, place, and time.  Skin: Skin is warm and dry.    ED Course  Procedures (including critical care time)  Labs Reviewed - No data to display Dg Hip Complete Left  12/20/2011  *RADIOLOGY REPORT*  Clinical Data: Left hip pain.  LEFT HIP - COMPLETE 2+ VIEW  Comparison: None  Findings: Both hips are normally located.  No acute fracture or evidence of avascular necrosis.  Minimal degenerative changes for age.  Extensive vascular calcifications are noted.  The pubic symphysis and SI joints are intact.  No pelvic fractures. Advanced degenerative changes in the lower lumbar spine.  IMPRESSION: No acute bony findings or significant degenerative changes.  Original Report Authenticated By: P. Loralie Champagne, M.D.     1. Bursitis of left hip       MDM  X-rays reviewed and report per radiologist.         Linna Hoff, MD 12/20/11 [phone]

## 2011-12-20 NOTE — ED Notes (Signed)
Pt has lt hip pain for three weeks, she denies injury and states the pain radiates to her lt knee.  Worse after sitting and getting up to standing position.

## 2011-12-24 DIAGNOSIS — E7800 Pure hypercholesterolemia, unspecified: Secondary | ICD-10-CM | POA: Diagnosis not present

## 2011-12-24 DIAGNOSIS — I10 Essential (primary) hypertension: Secondary | ICD-10-CM | POA: Diagnosis not present

## 2011-12-31 DIAGNOSIS — H6120 Impacted cerumen, unspecified ear: Secondary | ICD-10-CM | POA: Diagnosis not present

## 2011-12-31 DIAGNOSIS — I10 Essential (primary) hypertension: Secondary | ICD-10-CM | POA: Diagnosis not present

## 2011-12-31 DIAGNOSIS — M25559 Pain in unspecified hip: Secondary | ICD-10-CM | POA: Diagnosis not present

## 2011-12-31 DIAGNOSIS — E785 Hyperlipidemia, unspecified: Secondary | ICD-10-CM | POA: Diagnosis not present

## 2011-12-31 DIAGNOSIS — R05 Cough: Secondary | ICD-10-CM | POA: Diagnosis not present

## 2012-01-06 DIAGNOSIS — Z79899 Other long term (current) drug therapy: Secondary | ICD-10-CM | POA: Diagnosis not present

## 2012-01-14 DIAGNOSIS — M5137 Other intervertebral disc degeneration, lumbosacral region: Secondary | ICD-10-CM | POA: Diagnosis not present

## 2012-01-14 DIAGNOSIS — M4310 Spondylolisthesis, site unspecified: Secondary | ICD-10-CM | POA: Diagnosis not present

## 2012-01-21 DIAGNOSIS — M810 Age-related osteoporosis without current pathological fracture: Secondary | ICD-10-CM | POA: Diagnosis not present

## 2012-02-04 DIAGNOSIS — M5380 Other specified dorsopathies, site unspecified: Secondary | ICD-10-CM | POA: Diagnosis not present

## 2012-02-04 DIAGNOSIS — IMO0002 Reserved for concepts with insufficient information to code with codable children: Secondary | ICD-10-CM | POA: Diagnosis not present

## 2012-02-04 DIAGNOSIS — G894 Chronic pain syndrome: Secondary | ICD-10-CM | POA: Diagnosis not present

## 2012-02-04 DIAGNOSIS — M47817 Spondylosis without myelopathy or radiculopathy, lumbosacral region: Secondary | ICD-10-CM | POA: Diagnosis not present

## 2012-02-08 ENCOUNTER — Other Ambulatory Visit (HOSPITAL_COMMUNITY): Payer: Self-pay | Admitting: Internal Medicine

## 2012-02-08 DIAGNOSIS — Z1231 Encounter for screening mammogram for malignant neoplasm of breast: Secondary | ICD-10-CM

## 2012-02-25 DIAGNOSIS — M5137 Other intervertebral disc degeneration, lumbosacral region: Secondary | ICD-10-CM | POA: Diagnosis not present

## 2012-03-24 DIAGNOSIS — M79609 Pain in unspecified limb: Secondary | ICD-10-CM | POA: Diagnosis not present

## 2012-03-24 DIAGNOSIS — M47817 Spondylosis without myelopathy or radiculopathy, lumbosacral region: Secondary | ICD-10-CM | POA: Diagnosis not present

## 2012-03-24 DIAGNOSIS — M5137 Other intervertebral disc degeneration, lumbosacral region: Secondary | ICD-10-CM | POA: Diagnosis not present

## 2012-03-24 DIAGNOSIS — G894 Chronic pain syndrome: Secondary | ICD-10-CM | POA: Diagnosis not present

## 2012-03-31 ENCOUNTER — Ambulatory Visit (HOSPITAL_COMMUNITY): Payer: Federal, State, Local not specified - PPO

## 2012-04-05 ENCOUNTER — Ambulatory Visit (HOSPITAL_COMMUNITY)
Admission: RE | Admit: 2012-04-05 | Discharge: 2012-04-05 | Disposition: A | Discharge status: Home / Self Care | Payer: Medicare Other | Source: Ambulatory Visit | Attending: Internal Medicine | Admitting: Internal Medicine

## 2012-04-05 DIAGNOSIS — Z1231 Encounter for screening mammogram for malignant neoplasm of breast: Secondary | ICD-10-CM | POA: Insufficient documentation

## 2012-06-30 DIAGNOSIS — R7309 Other abnormal glucose: Secondary | ICD-10-CM | POA: Diagnosis not present

## 2012-06-30 DIAGNOSIS — E785 Hyperlipidemia, unspecified: Secondary | ICD-10-CM | POA: Diagnosis not present

## 2012-06-30 DIAGNOSIS — M810 Age-related osteoporosis without current pathological fracture: Secondary | ICD-10-CM | POA: Diagnosis not present

## 2012-07-14 DIAGNOSIS — E559 Vitamin D deficiency, unspecified: Secondary | ICD-10-CM | POA: Diagnosis not present

## 2012-07-14 DIAGNOSIS — Z23 Encounter for immunization: Secondary | ICD-10-CM | POA: Diagnosis not present

## 2012-07-14 DIAGNOSIS — R7309 Other abnormal glucose: Secondary | ICD-10-CM | POA: Diagnosis not present

## 2012-07-14 DIAGNOSIS — E7800 Pure hypercholesterolemia, unspecified: Secondary | ICD-10-CM | POA: Diagnosis not present

## 2012-08-15 DIAGNOSIS — I7389 Other specified peripheral vascular diseases: Secondary | ICD-10-CM | POA: Diagnosis not present

## 2012-08-15 DIAGNOSIS — I6529 Occlusion and stenosis of unspecified carotid artery: Secondary | ICD-10-CM | POA: Diagnosis not present

## 2012-08-15 DIAGNOSIS — I70219 Atherosclerosis of native arteries of extremities with intermittent claudication, unspecified extremity: Secondary | ICD-10-CM | POA: Diagnosis not present

## 2012-08-25 DIAGNOSIS — Z9861 Coronary angioplasty status: Secondary | ICD-10-CM | POA: Diagnosis not present

## 2012-08-25 DIAGNOSIS — I6529 Occlusion and stenosis of unspecified carotid artery: Secondary | ICD-10-CM | POA: Diagnosis not present

## 2012-08-25 DIAGNOSIS — I70219 Atherosclerosis of native arteries of extremities with intermittent claudication, unspecified extremity: Secondary | ICD-10-CM | POA: Diagnosis not present

## 2012-08-25 DIAGNOSIS — E7800 Pure hypercholesterolemia, unspecified: Secondary | ICD-10-CM | POA: Diagnosis not present

## 2012-08-25 DIAGNOSIS — I10 Essential (primary) hypertension: Secondary | ICD-10-CM | POA: Diagnosis not present

## 2012-09-01 DIAGNOSIS — H43819 Vitreous degeneration, unspecified eye: Secondary | ICD-10-CM | POA: Diagnosis not present

## 2012-09-01 DIAGNOSIS — Z961 Presence of intraocular lens: Secondary | ICD-10-CM | POA: Diagnosis not present

## 2012-10-06 ENCOUNTER — Encounter: Payer: Self-pay | Admitting: Vascular Surgery

## 2012-11-11 ENCOUNTER — Telehealth: Payer: Self-pay | Admitting: Vascular Surgery

## 2012-11-11 NOTE — Telephone Encounter (Signed)
Received a note from the patient regarding the chart audit letter.   She states Dr. Jacinto Halim is following her carotids at his office.   If she needs [place] in the future Dr. Jacinto Halim will get in touch with Dr. Arbie Cookey.

## 2013-01-19 DIAGNOSIS — R7309 Other abnormal glucose: Secondary | ICD-10-CM | POA: Diagnosis not present

## 2013-01-19 DIAGNOSIS — N390 Urinary tract infection, site not specified: Secondary | ICD-10-CM | POA: Diagnosis not present

## 2013-01-19 DIAGNOSIS — E7800 Pure hypercholesterolemia, unspecified: Secondary | ICD-10-CM | POA: Diagnosis not present

## 2013-01-19 DIAGNOSIS — Z0000 Encounter for general adult medical examination without abnormal findings: Secondary | ICD-10-CM | POA: Diagnosis not present

## 2013-01-19 DIAGNOSIS — E559 Vitamin D deficiency, unspecified: Secondary | ICD-10-CM | POA: Diagnosis not present

## 2013-01-26 DIAGNOSIS — E559 Vitamin D deficiency, unspecified: Secondary | ICD-10-CM | POA: Diagnosis not present

## 2013-01-26 DIAGNOSIS — R7309 Other abnormal glucose: Secondary | ICD-10-CM | POA: Diagnosis not present

## 2013-01-26 DIAGNOSIS — E7800 Pure hypercholesterolemia, unspecified: Secondary | ICD-10-CM | POA: Diagnosis not present

## 2013-01-26 DIAGNOSIS — M810 Age-related osteoporosis without current pathological fracture: Secondary | ICD-10-CM | POA: Diagnosis not present

## 2013-02-02 DIAGNOSIS — Z780 Asymptomatic menopausal state: Secondary | ICD-10-CM | POA: Diagnosis not present

## 2013-02-02 DIAGNOSIS — Z01419 Encounter for gynecological examination (general) (routine) without abnormal findings: Secondary | ICD-10-CM | POA: Diagnosis not present

## 2013-03-09 ENCOUNTER — Other Ambulatory Visit (HOSPITAL_COMMUNITY): Payer: Self-pay | Admitting: Internal Medicine

## 2013-03-09 DIAGNOSIS — Z1231 Encounter for screening mammogram for malignant neoplasm of breast: Secondary | ICD-10-CM

## 2013-04-10 ENCOUNTER — Ambulatory Visit (HOSPITAL_COMMUNITY)
Admission: RE | Admit: 2013-04-10 | Discharge: 2013-04-10 | Disposition: A | Discharge status: Home / Self Care | Payer: Medicare Other | Source: Ambulatory Visit | Attending: Internal Medicine | Admitting: Internal Medicine

## 2013-04-10 DIAGNOSIS — Z1231 Encounter for screening mammogram for malignant neoplasm of breast: Secondary | ICD-10-CM | POA: Diagnosis not present

## 2013-05-01 DIAGNOSIS — R7309 Other abnormal glucose: Secondary | ICD-10-CM | POA: Diagnosis not present

## 2013-05-01 DIAGNOSIS — I10 Essential (primary) hypertension: Secondary | ICD-10-CM | POA: Diagnosis not present

## 2013-05-01 DIAGNOSIS — E559 Vitamin D deficiency, unspecified: Secondary | ICD-10-CM | POA: Diagnosis not present

## 2013-05-04 DIAGNOSIS — E7800 Pure hypercholesterolemia, unspecified: Secondary | ICD-10-CM | POA: Diagnosis not present

## 2013-05-04 DIAGNOSIS — R7309 Other abnormal glucose: Secondary | ICD-10-CM | POA: Diagnosis not present

## 2013-05-04 DIAGNOSIS — E559 Vitamin D deficiency, unspecified: Secondary | ICD-10-CM | POA: Diagnosis not present

## 2013-05-04 DIAGNOSIS — I10 Essential (primary) hypertension: Secondary | ICD-10-CM | POA: Diagnosis not present

## 2013-05-18 DIAGNOSIS — I10 Essential (primary) hypertension: Secondary | ICD-10-CM | POA: Diagnosis not present

## 2013-07-27 DIAGNOSIS — I10 Essential (primary) hypertension: Secondary | ICD-10-CM | POA: Diagnosis not present

## 2013-07-27 DIAGNOSIS — R7309 Other abnormal glucose: Secondary | ICD-10-CM | POA: Diagnosis not present

## 2013-07-27 DIAGNOSIS — E559 Vitamin D deficiency, unspecified: Secondary | ICD-10-CM | POA: Diagnosis not present

## 2013-08-03 DIAGNOSIS — R7309 Other abnormal glucose: Secondary | ICD-10-CM | POA: Diagnosis not present

## 2013-08-03 DIAGNOSIS — Z23 Encounter for immunization: Secondary | ICD-10-CM | POA: Diagnosis not present

## 2013-08-03 DIAGNOSIS — E7800 Pure hypercholesterolemia, unspecified: Secondary | ICD-10-CM | POA: Diagnosis not present

## 2013-08-03 DIAGNOSIS — E559 Vitamin D deficiency, unspecified: Secondary | ICD-10-CM | POA: Diagnosis not present

## 2013-08-03 DIAGNOSIS — I10 Essential (primary) hypertension: Secondary | ICD-10-CM | POA: Diagnosis not present

## 2013-08-31 DIAGNOSIS — I6529 Occlusion and stenosis of unspecified carotid artery: Secondary | ICD-10-CM | POA: Diagnosis not present

## 2013-09-05 ENCOUNTER — Encounter (HOSPITAL_COMMUNITY): Payer: Self-pay | Admitting: Emergency Medicine

## 2013-09-05 ENCOUNTER — Emergency Department (HOSPITAL_COMMUNITY)
Admission: EM | Admit: 2013-09-05 | Discharge: 2013-09-05 | Disposition: A | Discharge status: Home / Self Care | Payer: Medicare Other | Attending: Emergency Medicine | Admitting: Emergency Medicine

## 2013-09-05 DIAGNOSIS — I2510 Atherosclerotic heart disease of native coronary artery without angina pectoris: Secondary | ICD-10-CM | POA: Insufficient documentation

## 2013-09-05 DIAGNOSIS — I252 Old myocardial infarction: Secondary | ICD-10-CM | POA: Insufficient documentation

## 2013-09-05 DIAGNOSIS — I10 Essential (primary) hypertension: Secondary | ICD-10-CM | POA: Insufficient documentation

## 2013-09-05 DIAGNOSIS — Z9861 Coronary angioplasty status: Secondary | ICD-10-CM | POA: Insufficient documentation

## 2013-09-05 DIAGNOSIS — Y9389 Activity, other specified: Secondary | ICD-10-CM | POA: Insufficient documentation

## 2013-09-05 DIAGNOSIS — Y9229 Other specified public building as the place of occurrence of the external cause: Secondary | ICD-10-CM | POA: Insufficient documentation

## 2013-09-05 DIAGNOSIS — IMO0002 Reserved for concepts with insufficient information to code with codable children: Secondary | ICD-10-CM | POA: Insufficient documentation

## 2013-09-05 DIAGNOSIS — E7800 Pure hypercholesterolemia, unspecified: Secondary | ICD-10-CM | POA: Insufficient documentation

## 2013-09-05 DIAGNOSIS — Z79899 Other long term (current) drug therapy: Secondary | ICD-10-CM | POA: Diagnosis not present

## 2013-09-05 DIAGNOSIS — Z87891 Personal history of nicotine dependence: Secondary | ICD-10-CM | POA: Diagnosis not present

## 2013-09-05 DIAGNOSIS — T18128A Food in esophagus causing other injury, initial encounter: Secondary | ICD-10-CM

## 2013-09-05 DIAGNOSIS — T18108A Unspecified foreign body in esophagus causing other injury, initial encounter: Secondary | ICD-10-CM | POA: Diagnosis not present

## 2013-09-05 DIAGNOSIS — R110 Nausea: Secondary | ICD-10-CM | POA: Insufficient documentation

## 2013-09-05 DIAGNOSIS — Z7982 Long term (current) use of aspirin: Secondary | ICD-10-CM | POA: Diagnosis not present

## 2013-09-05 DIAGNOSIS — K222 Esophageal obstruction: Secondary | ICD-10-CM | POA: Diagnosis not present

## 2013-09-05 HISTORY — DX: Acute myocardial infarction, unspecified: I21.9

## 2013-09-05 MED ORDER — GLUCAGON HCL (RDNA) 1 MG IJ SOLR
1.0000 mg | Freq: Once | INTRAMUSCULAR | Status: DC
  Filled 2013-09-05: qty 1

## 2013-09-05 NOTE — ED Notes (Signed)
Pt states she continues to feel fine  Talking with family in room  No acute distress noted

## 2013-09-05 NOTE — ED Provider Notes (Signed)
CSN: [id number]     Arrival date & time 09/05/13  1916 History   First MD Initiated Contact with Patient 09/05/13 1918     Chief Complaint  Patient presents with  . Airway Obstruction   (Consider location/radiation/quality/duration/timing/severity/associated sxs/prior Treatment) HPI  This is a 72 year old female with a history of coronary artery disease, hypertension who presents with "I think some beef got stuck in my throat." Patient reports being at a dinner earlier this evening. She took one bite of beef and felt a sensation as if it got stuck in her throat. She's not been able to swallow water since that time. She is nauseous and spitting her secretions. She denies any shortness of breath or chest pain. Patient reports one similar episode several years ago. Patient required endoscopy for movable of the impaction.  Past Medical History  Diagnosis Date  . Coronary artery disease   . Hypercholesteremia   . Hypertension   . Myocardial infarct    Past Surgical History  Procedure Laterality Date  . Tubal ligation    . Angioplasty    . Carotid endarterectomy    . Eye surgery     Family History  Problem Relation Age of Onset  . Diabetes Mother   . Hypertension Mother   . Cancer Sister    History  Substance Use Topics  . Smoking status: Former Games developer  . Smokeless tobacco: Not on file  . Alcohol Use: No   OB History   Grav Para Term Preterm Abortions TAB SAB Ect Mult Living                 Review of Systems  Constitutional: Negative for fever.  Respiratory: Negative for cough, chest tightness and shortness of breath.   Cardiovascular: Negative for chest pain.  Gastrointestinal: Positive for nausea. Negative for vomiting and abdominal pain.  All other systems reviewed and are negative.    Allergies  Sulfa antibiotics; Ivp dye; Lipitor; Macrodantin; Meloxicam; Motrin; Naprosyn; Niaspan; Pletal; Prednisone; Ramipril; Vioxx; and Neosporin  Home Medications   Current  Outpatient Rx  Name  Route  Sig  Dispense  Refill  . aspirin 81 MG tablet   Oral   Take 162 mg by mouth daily.          . bisoprolol (ZEBETA) 5 MG tablet   Oral   Take 5 mg by mouth daily.         . Calcium Carbonate (CALTRATE 600 PO)   Oral   Take 1 tablet by mouth daily.          . calcium carbonate (TUMS - DOSED IN MG ELEMENTAL CALCIUM) 500 MG chewable tablet   Oral   Chew 2 tablets by mouth daily as needed for indigestion or heartburn.         . cetirizine (ZYRTEC) 10 MG tablet   Oral   Take 10 mg by mouth daily as needed for allergies.         . Cholecalciferol (VITAMIN D) 2000 UNITS CAPS   Oral   Take 2,000 Units by mouth daily.         Marland Kitchen ezetimibe (ZETIA) 10 MG tablet   Oral   Take 10 mg by mouth daily.         . fluvastatin XL (LESCOL XL) 80 MG 24 hr tablet   Oral   Take 80 mg by mouth daily.         Marland Kitchen losartan (COZAAR) 50 MG tablet   Oral  Take 50 mg by mouth daily.         . Multiple Vitamins-Minerals (ICAPS MV PO)   Oral   Take 1 tablet by mouth daily.          . Omega-3 Fatty Acids (FISH OIL) 1000 MG CPDR   Oral   Take 2 g by mouth daily.          . raloxifene (EVISTA) 60 MG tablet   Oral   Take 60 mg by mouth daily.         . vitamin C (ASCORBIC ACID) 250 MG tablet   Oral   Take 250 mg by mouth daily.         Marland Kitchen Antiseborrheic Products, Misc. (PROMISEB) CREA   Apply externally   Apply topically.          BP 182/81  Pulse 103  Temp(Src) 98.7 F (37.1 C) (Oral)  Resp 20  SpO2 100% Physical Exam  Nursing note and vitals reviewed. Constitutional: She is oriented to person, place, and time.  Elderly, no acute distress, patient spitting into an emesis bag  HENT:  Head: Normocephalic and atraumatic.  No edema or trismus noted  Eyes: Pupils are equal, round, and reactive to light.  Neck: Neck supple.  Cardiovascular: Normal rate, regular rhythm and normal heart sounds.   No murmur heard. Pulmonary/Chest:  Effort normal. No respiratory distress.  Abdominal: Soft. Bowel sounds are normal. There is no tenderness.  Neurological: She is alert and oriented to person, place, and time.  Skin: Skin is warm and dry.  Psychiatric: She has a normal mood and affect.    ED Course  Procedures (including critical care time) Labs Review Labs Reviewed - No data to display Imaging Review No results found.  EKG Interpretation   None       MDM   1. Food impaction of esophagus, initial encounter    Patient presents with food impaction. She is nontoxic-appearing. She has no evidence of respiratory distress. She is spitting into a emesis basin. Patient reports a history of having to have an endoscopy in the past for the same. IV glucagon was ordered and GI was consulted. Prior to receiving glucagon, the patient reported improvement of sensation of impaction. She was able to swallow her secretions and has been able to tolerate by mouth. I did speak with the GI Dr. on call for [facility] who agrees with monitoring the patient for short duration of time and ensuring that she can tolerate by mouth. She should call Dr. Ewing Schlein for followup as she may be developing a stricture.  Repeat examination, patient is awake, alert, and in no acute distress. She's been able to tolerate liquid intake. She was encouraged to return if she has worsening sore throat, difficulty swallowing, shortness of breath. Patient and her family stated understanding.  After history, exam, and medical workup I feel the patient has been appropriately medically screened and is safe for discharge home. Pertinent diagnoses were discussed with the patient. Patient was given return precautions.     Shon Baton, MD 09/05/13 2115

## 2013-09-05 NOTE — ED Notes (Signed)
Went to start pt's IV and pt states she feels better and is able to swallow without difficulty  Pt able to drink water without difficulty  EDP [place] notified  No further orders at this time  Will continue to monitor pt

## 2013-09-05 NOTE — ED Notes (Signed)
Pt states she was at a church supper and swallowed some beef and it is hung in her throat  Pt states she is unable to swallow and is spitting out saliva  Pt is able to breathe and speak without difficulty

## 2013-09-14 DIAGNOSIS — I2510 Atherosclerotic heart disease of native coronary artery without angina pectoris: Secondary | ICD-10-CM | POA: Diagnosis not present

## 2013-09-14 DIAGNOSIS — I70219 Atherosclerosis of native arteries of extremities with intermittent claudication, unspecified extremity: Secondary | ICD-10-CM | POA: Diagnosis not present

## 2013-09-14 DIAGNOSIS — I6529 Occlusion and stenosis of unspecified carotid artery: Secondary | ICD-10-CM | POA: Diagnosis not present

## 2013-09-14 DIAGNOSIS — E7800 Pure hypercholesterolemia, unspecified: Secondary | ICD-10-CM | POA: Diagnosis not present

## 2013-09-25 ENCOUNTER — Encounter (HOSPITAL_COMMUNITY): Payer: Self-pay | Admitting: Pharmacy Technician

## 2013-10-03 DIAGNOSIS — Z01810 Encounter for preprocedural cardiovascular examination: Secondary | ICD-10-CM | POA: Diagnosis not present

## 2013-10-03 DIAGNOSIS — I6529 Occlusion and stenosis of unspecified carotid artery: Secondary | ICD-10-CM | POA: Diagnosis not present

## 2013-10-03 DIAGNOSIS — I10 Essential (primary) hypertension: Secondary | ICD-10-CM | POA: Diagnosis not present

## 2013-10-10 ENCOUNTER — Ambulatory Visit (HOSPITAL_COMMUNITY)
Admission: RE | Admit: 2013-10-10 | Discharge: 2013-10-10 | Disposition: A | Discharge status: Home / Self Care | Payer: Medicare Other | Source: Ambulatory Visit | Attending: Cardiology | Admitting: Cardiology

## 2013-10-10 ENCOUNTER — Encounter (HOSPITAL_COMMUNITY)
Admission: RE | Disposition: A | Discharge status: Home / Self Care | Payer: Self-pay | Source: Ambulatory Visit | Attending: Cardiology

## 2013-10-10 DIAGNOSIS — I2510 Atherosclerotic heart disease of native coronary artery without angina pectoris: Secondary | ICD-10-CM | POA: Diagnosis not present

## 2013-10-10 DIAGNOSIS — I708 Atherosclerosis of other arteries: Secondary | ICD-10-CM | POA: Insufficient documentation

## 2013-10-10 DIAGNOSIS — I70219 Atherosclerosis of native arteries of extremities with intermittent claudication, unspecified extremity: Secondary | ICD-10-CM | POA: Diagnosis not present

## 2013-10-10 DIAGNOSIS — Z9861 Coronary angioplasty status: Secondary | ICD-10-CM | POA: Diagnosis not present

## 2013-10-10 DIAGNOSIS — I6529 Occlusion and stenosis of unspecified carotid artery: Secondary | ICD-10-CM | POA: Diagnosis not present

## 2013-10-10 HISTORY — PX: CAROTID ANGIOGRAM: SHX5504

## 2013-10-10 SURGERY — CAROTID ANGIOGRAM
Anesthesia: LOCAL

## 2013-10-10 MED ORDER — FAMOTIDINE IN NACL 20-0.9 MG/50ML-% IV SOLN
20.0000 mg | INTRAVENOUS | Status: AC
Start: 2013-10-10 — End: 2013-10-10
  Administered 2013-10-10: 20 mg via INTRAVENOUS

## 2013-10-10 MED ORDER — FAMOTIDINE IN NACL 20-0.9 MG/50ML-% IV SOLN
INTRAVENOUS | Status: AC
  Filled 2013-10-10: qty 50

## 2013-10-10 MED ORDER — SODIUM CHLORIDE 0.9 % IV SOLN: 1.0000 mL/kg/h | INTRAVENOUS | Status: DC

## 2013-10-10 MED ORDER — SODIUM CHLORIDE 0.9 % IV BOLUS (SEPSIS)
500.0000 mL | Freq: Once | INTRAVENOUS | Status: AC
  Administered 2013-10-10: 500 mL via INTRAVENOUS

## 2013-10-10 MED ORDER — HEPARIN (PORCINE) IN NACL 2-0.9 UNIT/ML-% IJ SOLN
INTRAMUSCULAR | Status: AC
  Filled 2013-10-10: qty 1000

## 2013-10-10 MED ORDER — SODIUM CHLORIDE 0.9 % IV SOLN: INTRAVENOUS | Status: DC

## 2013-10-10 MED ORDER — METHYLPREDNISOLONE SODIUM SUCC 125 MG IJ SOLR
60.0000 mg | INTRAMUSCULAR | Status: AC
  Administered 2013-10-10: 60 mg via INTRAVENOUS

## 2013-10-10 MED ORDER — ONDANSETRON HCL 4 MG/2ML IJ SOLN: 4.0000 mg | Freq: Four times a day (QID) | INTRAMUSCULAR | Status: DC | PRN

## 2013-10-10 MED ORDER — LIDOCAINE HCL (PF) 1 % IJ SOLN
INTRAMUSCULAR | Status: AC
  Filled 2013-10-10: qty 30

## 2013-10-10 MED ORDER — DIPHENHYDRAMINE HCL 50 MG/ML IJ SOLN
25.0000 mg | INTRAMUSCULAR | Status: AC
  Administered 2013-10-10: 25 mg via INTRAVENOUS

## 2013-10-10 MED ORDER — METHYLPREDNISOLONE SODIUM SUCC 125 MG IJ SOLR
INTRAMUSCULAR | Status: AC
  Filled 2013-10-10: qty 2

## 2013-10-10 MED ORDER — DIPHENHYDRAMINE HCL 50 MG/ML IJ SOLN
INTRAMUSCULAR | Status: AC
  Filled 2013-10-10: qty 1

## 2013-10-10 MED ORDER — ACETAMINOPHEN 325 MG PO TABS: 650.0000 mg | ORAL_TABLET | ORAL | Status: DC | PRN

## 2013-10-10 NOTE — Interval H&P Note (Signed)
History and Physical Interval Note:  10/10/2013 7:52 AM  [name]  has presented today for surgery, with the diagnosis of Claudication  The various methods of treatment have been discussed with the patient and family. After consideration of risks, benefits and other options for treatment, the patient has consented to  Procedure(s): CAROTID ANGIOGRAM (N/A) and peripheral arteriogram and possible PTA as a surgical intervention .  The patient's history has been reviewed, patient examined, no change in status, stable for surgery.  I have reviewed the patient's chart and labs.  Questions were answered to the patient's satisfaction.     Pamella Pert

## 2013-10-10 NOTE — H&P (Signed)
  Please see office visit notes for complete details of HPI.  

## 2013-10-10 NOTE — CV Procedure (Signed)
Procedure performed: 1. Arch aortogram. 2. Selective right carotid and selective left carotid arteriogram. 3. Selective left vertebral arteriogram. 4. Abdominal aortogram and pelvic aortogram (limited LE arteriogram), placement of the catheter tip at the iliac bifurcation and distal abdominal aortogram (limited bilateral lower extremity arteriogram) for visualization of the bilateral iliac arteries. 5. Intracerebral arteriogram.  Indication: Aylene Acoff is a 72 year old Caucasian female with severe peripheral arterial disease, and history of left subclavian artery stenting, left iliac artery stenting, right SFA stenting, left carotid endarterectomy, known coronary artery disease with history of PTCA in the past, presenting with asymptomatic carotid artery stenosis, outpatient Doppler evaluation had revealed progression of stenosis severity in the range of greater than 70%. Hence brought to the peripheral angiography suite to confirm the stenosis severity. Abdominal aortogram was performed because of suspicion for hip claudication.  Findings:   Arch aortogram: Mild atherosclerotic changes of the aortic arch is evident. Diffuse calcification is evident. Type II arch. Bovine arch.  Right carotid artery: There is mild disease in the right common and internal carotid artery. Mid to distal segment of the ICA shows a mild kink without high-grade stenosis.  Left carotid artery: The left common carotid artery is smooth with mild luminal irregularity, the distal left common carotid artery just before bifurcation and patch endarterectomy site shows a 50-60% stenosis. The internal carotid artery patch endarterectomy site is ectatic but is patent. Distal ICA is widely patent. There was a 20 mm mercury pressure gradient across the distal CCA stenosis.  Left vertebral artery: Smooth, widely patent without any narrowing.  Intracerebral arteriogram: Please see radiology report for the same.  Abdominal aortogram:  The abdominal aortogram reveals moderate to severe diffuse luminal irregularity with atherosclerotic plaque and calcification. Distal abdominal aorta shows a 20-30% luminal narrowing. Aortoiliac bifurcation is patent with mild 20-30% iliac ostial stenosis bilaterally. Left common iliac artery stent is widely patent. There is diffuse 30-40% stenosis of the iliac vessels. Moderate calcification is evident throughout the vessels including the common femoral arteries. Next flow is evident bilaterally.  Impression: I will discuss my findings of the angiogram with Dr. Gretta Began, I do not think that this is a high-grade stenosis. From angiogram, I should be able to correlate Doppler findings on serial surveillance Dopplers. I suspect the left distal CCA stenosis in the range of 50-60% with a 20 mm mercury pressure gradient by pullback. I don't think that is his high-grade. With regard to claudication, I suspect this is pseudo-claudication or due to arthritis as the iliac vessels are widely patent although they have moderate to severe diffuse atherosclerotic disease. A total of 127 cc of contrast was utilized for diagnostic angiography.  Technique of the procedure: Under sterile precautions using a 5 French right femoral arterial access under fluoroscopic guidance, a 5 French pigtail catheter was advanced into the arch of aorta and arch aortogram was performed in the projection. The same pigtail catheter was then utilized to perform abdominal aortogram and limited lower extremity arteriogram. The catheter was then pulled out of the body over the Versacore wire and a JB-1, 5 [place] diagnostic catheter was utilized to engage the right common carotid artery and angiography was performed in multiple views. The same catheter was then utilized to engage the left vertebral artery and also the left common carotid artery and angiography was performed. To perform pullback, the utilized Versacore wire extension and reintroduced  the wire into the left internal carotid artery and a glide tip 5 French straight endhole catheter was  advanced into the left internal carotid artery and careful pullback was performed. The catheter was then pulled out of the body. Manual pressure was held for achieving hemostasis. There was no immediate complication.

## 2013-10-25 DIAGNOSIS — R7309 Other abnormal glucose: Secondary | ICD-10-CM | POA: Diagnosis not present

## 2013-10-25 DIAGNOSIS — I6529 Occlusion and stenosis of unspecified carotid artery: Secondary | ICD-10-CM | POA: Diagnosis not present

## 2013-10-25 DIAGNOSIS — I70219 Atherosclerosis of native arteries of extremities with intermittent claudication, unspecified extremity: Secondary | ICD-10-CM | POA: Diagnosis not present

## 2013-10-25 DIAGNOSIS — E7800 Pure hypercholesterolemia, unspecified: Secondary | ICD-10-CM | POA: Diagnosis not present

## 2013-10-26 DIAGNOSIS — H01009 Unspecified blepharitis unspecified eye, unspecified eyelid: Secondary | ICD-10-CM | POA: Diagnosis not present

## 2013-10-26 DIAGNOSIS — H2640 Unspecified secondary cataract: Secondary | ICD-10-CM | POA: Diagnosis not present

## 2013-10-26 DIAGNOSIS — H43819 Vitreous degeneration, unspecified eye: Secondary | ICD-10-CM | POA: Diagnosis not present

## 2013-11-09 NOTE — Consult Note (Signed)
NAME:  [name], [name]                     ACCOUNT NO.:  MEDICAL RECORD NO.:  [id number]  LOCATION:                                 FACILITY:  PHYSICIAN:  Makira Holleman K. Devota Viruet, M.D.DATE OF BIRTH:  [birth year]  DATE OF CONSULTATION: DATE OF DISCHARGE:                                CONSULTATION   EXAMINATION:  Intracranial interpretation of bilateral common carotid arteriograms.  The right common carotid arteriogram demonstrates distal cervical segment of the right internal carotid artery to be widely patent.  The petrous, cavernous, and supraclinoid segments are widely patent.  The right  middle and  right  anterior cerebral artery  opacified normally into the capillary and venous phases.  The left common carotid arteriogram demonstrates the distal cervical portion to be widely patent.  The petrous , cavernous and supraclinoid segments are widely patent.  The left middle and left anterior cerebral arteries opacified normally into the capillary and venous phases.  IMPRESSION:  Grossly no angiographic evidence of stenosis, occlusions, dissections  Or of intracranial  vascular abnormalities seen on the study provided.          ______________________________ Fritz Pickerel Estanislado Pandy, M.D.     SKD/MEDQ  D:  11/08/2013  T:  11/09/2013  Job:  [id number]

## 2014-03-13 ENCOUNTER — Other Ambulatory Visit (HOSPITAL_COMMUNITY): Payer: Self-pay | Admitting: Internal Medicine

## 2014-03-13 DIAGNOSIS — Z1231 Encounter for screening mammogram for malignant neoplasm of breast: Secondary | ICD-10-CM

## 2014-03-19 DIAGNOSIS — I6529 Occlusion and stenosis of unspecified carotid artery: Secondary | ICD-10-CM | POA: Diagnosis not present

## 2014-04-02 DIAGNOSIS — I6529 Occlusion and stenosis of unspecified carotid artery: Secondary | ICD-10-CM | POA: Diagnosis not present

## 2014-04-02 DIAGNOSIS — I70219 Atherosclerosis of native arteries of extremities with intermittent claudication, unspecified extremity: Secondary | ICD-10-CM | POA: Diagnosis not present

## 2014-04-02 DIAGNOSIS — I2510 Atherosclerotic heart disease of native coronary artery without angina pectoris: Secondary | ICD-10-CM | POA: Diagnosis not present

## 2014-04-02 DIAGNOSIS — Z9889 Other specified postprocedural states: Secondary | ICD-10-CM | POA: Diagnosis not present

## 2014-04-05 DIAGNOSIS — I10 Essential (primary) hypertension: Secondary | ICD-10-CM | POA: Diagnosis not present

## 2014-04-05 DIAGNOSIS — E559 Vitamin D deficiency, unspecified: Secondary | ICD-10-CM | POA: Diagnosis not present

## 2014-04-05 DIAGNOSIS — R7309 Other abnormal glucose: Secondary | ICD-10-CM | POA: Diagnosis not present

## 2014-04-11 ENCOUNTER — Ambulatory Visit (HOSPITAL_COMMUNITY)
Admission: RE | Admit: 2014-04-11 | Discharge: 2014-04-11 | Disposition: A | Discharge status: Home / Self Care | Payer: Medicare Other | Source: Ambulatory Visit | Attending: Internal Medicine | Admitting: Internal Medicine

## 2014-04-11 DIAGNOSIS — Z1231 Encounter for screening mammogram for malignant neoplasm of breast: Secondary | ICD-10-CM | POA: Diagnosis not present

## 2014-04-11 DIAGNOSIS — I10 Essential (primary) hypertension: Secondary | ICD-10-CM | POA: Diagnosis not present

## 2014-04-11 DIAGNOSIS — Z23 Encounter for immunization: Secondary | ICD-10-CM | POA: Diagnosis not present

## 2014-04-11 DIAGNOSIS — E7800 Pure hypercholesterolemia, unspecified: Secondary | ICD-10-CM | POA: Diagnosis not present

## 2014-04-11 DIAGNOSIS — E559 Vitamin D deficiency, unspecified: Secondary | ICD-10-CM | POA: Diagnosis not present

## 2014-04-11 DIAGNOSIS — R7309 Other abnormal glucose: Secondary | ICD-10-CM | POA: Diagnosis not present

## 2014-05-09 DIAGNOSIS — I7389 Other specified peripheral vascular diseases: Secondary | ICD-10-CM | POA: Diagnosis not present

## 2014-05-11 DIAGNOSIS — I2510 Atherosclerotic heart disease of native coronary artery without angina pectoris: Secondary | ICD-10-CM | POA: Diagnosis not present

## 2014-05-11 DIAGNOSIS — I10 Essential (primary) hypertension: Secondary | ICD-10-CM | POA: Diagnosis not present

## 2014-05-15 DIAGNOSIS — K219 Gastro-esophageal reflux disease without esophagitis: Secondary | ICD-10-CM | POA: Diagnosis not present

## 2014-05-15 DIAGNOSIS — R1310 Dysphagia, unspecified: Secondary | ICD-10-CM | POA: Diagnosis not present

## 2014-06-21 DIAGNOSIS — K219 Gastro-esophageal reflux disease without esophagitis: Secondary | ICD-10-CM | POA: Diagnosis not present

## 2014-06-21 DIAGNOSIS — R1310 Dysphagia, unspecified: Secondary | ICD-10-CM | POA: Diagnosis not present

## 2014-08-12 DIAGNOSIS — R112 Nausea with vomiting, unspecified: Secondary | ICD-10-CM | POA: Diagnosis not present

## 2014-08-12 DIAGNOSIS — N390 Urinary tract infection, site not specified: Secondary | ICD-10-CM | POA: Diagnosis not present

## 2014-08-13 DIAGNOSIS — R197 Diarrhea, unspecified: Secondary | ICD-10-CM | POA: Diagnosis not present

## 2014-08-13 DIAGNOSIS — R112 Nausea with vomiting, unspecified: Secondary | ICD-10-CM | POA: Diagnosis not present

## 2014-08-13 DIAGNOSIS — E860 Dehydration: Secondary | ICD-10-CM | POA: Diagnosis not present

## 2014-08-14 DIAGNOSIS — R112 Nausea with vomiting, unspecified: Secondary | ICD-10-CM | POA: Diagnosis not present

## 2014-08-14 DIAGNOSIS — N390 Urinary tract infection, site not specified: Secondary | ICD-10-CM | POA: Diagnosis not present

## 2014-08-15 ENCOUNTER — Ambulatory Visit
Admission: RE | Admit: 2014-08-15 | Discharge: 2014-08-15 | Disposition: A | Discharge status: Home / Self Care | Payer: Medicare Other | Source: Ambulatory Visit | Attending: Internal Medicine | Admitting: Internal Medicine

## 2014-08-15 ENCOUNTER — Other Ambulatory Visit: Payer: Self-pay | Admitting: Internal Medicine

## 2014-08-15 DIAGNOSIS — R296 Repeated falls: Secondary | ICD-10-CM | POA: Diagnosis not present

## 2014-08-15 DIAGNOSIS — G4452 New daily persistent headache (NDPH): Secondary | ICD-10-CM

## 2014-08-15 DIAGNOSIS — E871 Hypo-osmolality and hyponatremia: Secondary | ICD-10-CM | POA: Diagnosis not present

## 2014-08-15 DIAGNOSIS — R42 Dizziness and giddiness: Secondary | ICD-10-CM | POA: Diagnosis not present

## 2014-08-15 DIAGNOSIS — E785 Hyperlipidemia, unspecified: Secondary | ICD-10-CM | POA: Diagnosis not present

## 2014-08-15 DIAGNOSIS — R51 Headache: Secondary | ICD-10-CM | POA: Diagnosis not present

## 2014-08-15 DIAGNOSIS — R000 Tachycardia, unspecified: Secondary | ICD-10-CM | POA: Diagnosis not present

## 2014-08-15 DIAGNOSIS — R112 Nausea with vomiting, unspecified: Secondary | ICD-10-CM | POA: Diagnosis not present

## 2014-08-17 DIAGNOSIS — R112 Nausea with vomiting, unspecified: Secondary | ICD-10-CM | POA: Diagnosis not present

## 2014-08-17 DIAGNOSIS — R51 Headache: Secondary | ICD-10-CM | POA: Diagnosis not present

## 2014-08-17 DIAGNOSIS — E876 Hypokalemia: Secondary | ICD-10-CM | POA: Diagnosis not present

## 2014-08-17 DIAGNOSIS — E871 Hypo-osmolality and hyponatremia: Secondary | ICD-10-CM | POA: Diagnosis not present

## 2014-08-27 ENCOUNTER — Ambulatory Visit (INDEPENDENT_AMBULATORY_CARE_PROVIDER_SITE_OTHER): Payer: Medicare Other | Admitting: Neurology

## 2014-08-27 ENCOUNTER — Encounter: Payer: Self-pay | Admitting: Neurology

## 2014-08-27 VITALS — BP 135/81 | HR 91 | Ht 63.0 in | Wt 128.0 lb

## 2014-08-27 DIAGNOSIS — G441 Vascular headache, not elsewhere classified: Secondary | ICD-10-CM

## 2014-08-27 DIAGNOSIS — I776 Arteritis, unspecified: Secondary | ICD-10-CM

## 2014-08-27 DIAGNOSIS — R51 Headache: Secondary | ICD-10-CM

## 2014-08-27 DIAGNOSIS — R519 Headache, unspecified: Secondary | ICD-10-CM | POA: Insufficient documentation

## 2014-08-27 NOTE — Progress Notes (Signed)
PATIENT: [name] DOB: July 01, 1941  HISTORICAL  JESLIN BAZINET is a 73 years old right-handed Caucasian female, referred by her primary care physician Dr. Ouida Sills for evaluation of temporal arteritis, frequent headaches,  In October 2015, she had experienced nausea vomiting, GI symptoms, has developed dehydration, began to have headaches then, later, she was diagnosed with UTI, was given a round of Cipro, has developed allergic reaction, with whole-body skin rash. She also developed hyponatremia, hypo-potassium, require supplements  During the process, her headache has getting worse, constant right side sharp headaches, she denied previous history of headaches, was evaluated by her primary care physician, I do not have laboratory result, per patient, elevated ESR 278, elevated C reactive protein, she also complained of whole body achy pain, weight loss of 20 pounds, she was diagnosed with temporal arteritis, was giving IV Solu-Medrol in November sixth 2015, her headache disappeared that evening, she is now on prednisone 40 mg every day since November seventh 2015, no headaches, overall her symptoms is much improved, no temporal artery biopsy is planned,  She is going to be followed by her primary care physician in 3 days.  CAT scan of the brain without contrast was normal  She denies visual loss, no focal symptoms  REVIEW OF SYSTEMS: Full 14 system review of systems performed and notable only for as above  ALLERGIES: Allergies  Allergen Reactions  . Sulfa Antibiotics Anaphylaxis  . Ivp Dye [Iodinated Diagnostic Agents] Nausea And Vomiting  . Lipitor [Atorvastatin]     Legs hurt so bad she couldn't walk  . Macrodantin Nausea And Vomiting    faint  . Meloxicam     Can't remember  . Motrin [Ibuprofen] Nausea And Vomiting  . Naprosyn [Naproxen] Nausea Only    faint  . Niaspan [Niacin Er]     Flushing, headache  . Pletal [Cilostazol]     headache  . Prednisone    confusion  . Ramipril Cough  . Vioxx [Rofecoxib]     "deathly ill"  . Neosporin [Neomycin-Bacitracin Zn-Polymyx] Rash    HOME MEDICATIONS: Current Outpatient Prescriptions on File Prior to Visit  Medication Sig Dispense Refill  . aspirin 81 MG tablet Take 162 mg by mouth daily.     . bisoprolol (ZEBETA) 5 MG tablet Take 5 mg by mouth daily.    . Calcium Carbonate (CALTRATE 600 PO) Take 1 tablet by mouth daily.     . calcium carbonate (TUMS - DOSED IN MG ELEMENTAL CALCIUM) 500 MG chewable tablet Chew 2 tablets by mouth daily as needed for indigestion or heartburn.    . cetirizine (ZYRTEC) 10 MG tablet Take 10 mg by mouth daily as needed for allergies.    . Cholecalciferol (VITAMIN D) 2000 UNITS CAPS Take 2,000 Units by mouth daily.    Marland Kitchen ezetimibe (ZETIA) 10 MG tablet Take 10 mg by mouth daily.    . fluvastatin XL (LESCOL XL) 80 MG 24 hr tablet Take 80 mg by mouth daily.    Marland Kitchen losartan (COZAAR) 50 MG tablet Take 50 mg by mouth daily.    . Multiple Vitamins-Minerals (ICAPS MV PO) Take 1 tablet by mouth daily.     . Omega-3 Fatty Acids (FISH OIL) 1000 MG CPDR Take 2 g by mouth daily.     . raloxifene (EVISTA) 60 MG tablet Take 60 mg by mouth daily.    . vitamin C (ASCORBIC ACID) 250 MG tablet Take 250 mg by mouth daily.     No  current facility-administered medications on file prior to visit.    PAST MEDICAL HISTORY: Past Medical History  Diagnosis Date  . Coronary artery disease   . Hypercholesteremia   . Hypertension   . Myocardial infarct   . HA (headache)   . Difficulty swallowing solids   . Difficulty swallowing pills     PAST SURGICAL HISTORY: Past Surgical History  Procedure Laterality Date  . Tubal ligation    . Angioplasty    . Carotid endarterectomy    . Eye surgery Bilateral     FAMILY HISTORY: Family History  Problem Relation Age of Onset  . Diabetes Mother   . Hypertension Mother   . Breast cancer Sister   . Heart attack Father     SOCIAL  HISTORY:  History   Social History  . Marital Status: Widowed    Spouse Name: N/A    Number of Children: 3  . Years of Education: college   Occupational History  .      Retired Therapist, sports   Social History Main Topics  . Smoking status: Former Research scientist (life sciences)  . Smokeless tobacco: Never Used     Comment: Quit in 2000  . Alcohol Use: No  . Drug Use: No  . Sexual Activity: Not on file   Other Topics Concern  . Not on file   Social History Narrative   Patient lives at home with her son and she takes care of him- Widowed.   Retired.    Education college Retired. RN.   Right handed.   Caffeine One pot of coffee daily. Sometimes coke cola.     PHYSICAL EXAM   Filed Vitals:   08/27/14 1237  BP: 135/81  Pulse: 91  Height: $Remove'5\' 3"'VLHwQnJ$  (1.6 m)  Weight: 128 lb (58.06 kg)    Not recorded      Body mass index is 22.68 kg/(m^2).   Generalized: In no acute distress  Neck: Supple, no carotid bruits   Cardiac: Regular rate rhythm  Pulmonary: Clear to auscultation bilaterally  Musculoskeletal: No deformity  Neurological examination  Mentation: Alert oriented to time, place, history taking, and causual conversation, tired looking elderly female  Cranial nerve II-XII: Pupils were equal round reactive to light. Extraocular movements were full.  Visual field were full on confrontational test. Bilateral fundi were sharp.  Facial sensation and strength were normal. Hearing was intact to finger rubbing bilaterally. Uvula tongue midline.  Head turning and shoulder shrug and were normal and symmetric.Tongue protrusion into cheek strength was normal.  Motor: Normal tone, bulk and strength.  Sensory: Intact to fine touch, pinprick, preserved vibratory sensation, and proprioception at toes.  Coordination: Normal finger to nose, heel-to-shin bilaterally there was no truncal ataxia  Gait: Rising up from seated position without assistance, normal stance, without trunk ataxia, moderate stride, good arm  swing, smooth turning, able to perform tiptoe, and heel walking without difficulty.   Romberg signs: Negative  Deep tendon reflexes: Brachioradialis 2/2, biceps 2/2, triceps 2/2, patellar 2/2, Achilles 2/2, plantar responses were flexor bilaterally.   DIAGNOSTIC DATA (LABS, IMAGING, TESTING) - I reviewed patient records, labs, notes, testing and imaging myself where available.  Lab Results  Component Value Date   WBC 6.9 05/16/2008   HGB 12.6 05/16/2008   HCT 37.1 05/16/2008   MCV 91.3 05/16/2008   PLT 327 05/16/2008      Component Value Date/Time   NA 138 05/16/2008 0600   K 4.5 HEMOLYZED SPECIMEN, RESULTS MAY BE AFFECTED 05/16/2008 0600  CL 107 05/16/2008 0600   CO2 25 05/16/2008 0600   GLUCOSE 120* 05/16/2008 0600   BUN 9 05/16/2008 0600   CREATININE 0.74 05/16/2008 0600   CALCIUM 9.0 05/16/2008 0600   GFRNONAA >60 05/16/2008 0600   GFRAA  05/16/2008 0600    >60        The eGFR has been calculated using the MDRD equation. This calculation has not been validated in all clinical    ASSESSMENT AND PLAN  LOURENE HOSTON is a 73 y.o. female complains of  New onset right-sided headaches, with elevated ESR, C-reactive protein, her symptoms has much improved off IV Solu-Medrol, now on prednisone 40 mg every day since August 18 2014  1, Her symptoms, and the laboratory evaluation does support a diagnosis of temporal arteritis, continue follow-up with her primary care physician 2, return to clinic in 1 month, bring her laboratory evaluations,    Marcial Pacas, M.D. Ph.D.  [facility] [address] [place], [place] [address] [phone]

## 2014-08-30 DIAGNOSIS — M316 Other giant cell arteritis: Secondary | ICD-10-CM | POA: Diagnosis not present

## 2014-09-14 DIAGNOSIS — M316 Other giant cell arteritis: Secondary | ICD-10-CM | POA: Diagnosis not present

## 2014-09-19 DIAGNOSIS — I6522 Occlusion and stenosis of left carotid artery: Secondary | ICD-10-CM | POA: Diagnosis not present

## 2014-09-20 ENCOUNTER — Encounter (HOSPITAL_COMMUNITY): Payer: Self-pay | Admitting: Cardiology

## 2014-09-24 ENCOUNTER — Ambulatory Visit: Payer: Medicare Other | Admitting: Neurology

## 2014-09-26 DIAGNOSIS — I771 Stricture of artery: Secondary | ICD-10-CM | POA: Diagnosis not present

## 2014-09-26 DIAGNOSIS — I6522 Occlusion and stenosis of left carotid artery: Secondary | ICD-10-CM | POA: Diagnosis not present

## 2014-09-26 DIAGNOSIS — I2510 Atherosclerotic heart disease of native coronary artery without angina pectoris: Secondary | ICD-10-CM | POA: Diagnosis not present

## 2014-09-26 DIAGNOSIS — I70213 Atherosclerosis of native arteries of extremities with intermittent claudication, bilateral legs: Secondary | ICD-10-CM | POA: Diagnosis not present

## 2014-09-28 DIAGNOSIS — M316 Other giant cell arteritis: Secondary | ICD-10-CM | POA: Diagnosis not present

## 2014-10-02 ENCOUNTER — Ambulatory Visit (INDEPENDENT_AMBULATORY_CARE_PROVIDER_SITE_OTHER): Payer: Medicare Other | Admitting: Neurology

## 2014-10-02 ENCOUNTER — Encounter: Payer: Self-pay | Admitting: Neurology

## 2014-10-02 VITALS — BP 135/86 | HR 72 | Ht 62.0 in | Wt 135.0 lb

## 2014-10-02 DIAGNOSIS — I776 Arteritis, unspecified: Secondary | ICD-10-CM | POA: Diagnosis not present

## 2014-10-02 NOTE — Progress Notes (Signed)
PATIENT: [name] DOB: 09-Feb-1941  HISTORICAL  [name] is a 73 years old right-handed Caucasian female, referred by her primary care physician Dr. Ouida Sills for evaluation of temporal arteritis, frequent headaches,  In October 2015, she had experienced nausea vomiting, GI symptoms, has developed dehydration, began to have headaches then, later, she was diagnosed with UTI, was given a round of Cipro, has developed allergic reaction, with whole-body skin rash. She also developed hyponatremia, hypo-potassium, require supplements  During the process, her headache has getting worse, constant right side sharp headaches, she denied previous history of headaches, was evaluated by her primary care physician, I do not have laboratory result, per patient, elevated ESR 278, elevated C reactive protein, she also complained of whole body achy pain, weight loss of 20 pounds, she was diagnosed with temporal arteritis, was giving IV Solu-Medrol in November 6th 2015, her headache disappeared that evening, she is now on prednisone 40 mg every day since November seventh 2015, no headaches, overall her symptoms is much improved, no temporal artery biopsy is planned,  She is going to be followed by her primary care physician in 3 days.  CAT scan of the brain without contrast was normal  She denies visual loss, no focal symptoms  UPDATE Dec 22nd 2015: She was seen by Dr. Ouida Sills, overall she feels better, she is on tapering dose of prednisone, 20 mg for 2 weeks, then 10 mg, she no longer has headaches,  REVIEW OF SYSTEMS: Full 14 system review of systems performed and notable only for as above  ALLERGIES: Allergies  Allergen Reactions  . Sulfa Antibiotics Anaphylaxis  . Ivp Dye [Iodinated Diagnostic Agents] Nausea And Vomiting  . Lipitor [Atorvastatin]     Legs hurt so bad she couldn't walk  . Macrodantin Nausea And Vomiting    faint  . Meloxicam     Can't remember  . Motrin [Ibuprofen]  Nausea And Vomiting  . Naprosyn [Naproxen] Nausea Only    faint  . Niaspan [Niacin Er]     Flushing, headache  . Pletal [Cilostazol]     headache  . Prednisone     confusion  . Ramipril Cough  . Vioxx [Rofecoxib]     "deathly ill"  . Neosporin [Neomycin-Bacitracin Zn-Polymyx] Rash    HOME MEDICATIONS: Current Outpatient Prescriptions on File Prior to Visit  Medication Sig Dispense Refill  . Antiseborrheic Products, Misc. (PROMISEB) CREA Apply topically as directed.    Marland Kitchen aspirin 81 MG tablet Take 162 mg by mouth daily.     . bisoprolol (ZEBETA) 5 MG tablet Take 5 mg by mouth daily.    . Calcium Carbonate (CALTRATE 600 PO) Take 1 tablet by mouth daily.     . calcium carbonate (TUMS - DOSED IN MG ELEMENTAL CALCIUM) 500 MG chewable tablet Chew 2 tablets by mouth daily as needed for indigestion or heartburn.    . cetirizine (ZYRTEC) 10 MG tablet Take 10 mg by mouth daily as needed for allergies.    . Cholecalciferol (VITAMIN D) 2000 UNITS CAPS Take 2,000 Units by mouth daily.    Marland Kitchen ezetimibe (ZETIA) 10 MG tablet Take 10 mg by mouth daily.    . fluvastatin XL (LESCOL XL) 80 MG 24 hr tablet Take 80 mg by mouth daily.    Marland Kitchen GNP CINNAMON PO Take by mouth daily.    Marland Kitchen losartan (COZAAR) 50 MG tablet Take 50 mg by mouth daily.    . Multiple Vitamins-Minerals (ICAPS MV PO) Take 1  tablet by mouth daily.     . Omega-3 Fatty Acids (FISH OIL) 1000 MG CPDR Take 2 g by mouth daily.     . predniSONE (DELTASONE) 20 MG tablet Take 20 mg by mouth 2 (two) times daily with a meal.    . raloxifene (EVISTA) 60 MG tablet Take 60 mg by mouth daily.    . vitamin C (ASCORBIC ACID) 250 MG tablet Take 250 mg by mouth daily.    . Vitamin D, Ergocalciferol, (DRISDOL) 50000 UNITS CAPS capsule Take 50,000 Units by mouth every 7 (seven) days.     No current facility-administered medications on file prior to visit.    PAST MEDICAL HISTORY: Past Medical History  Diagnosis Date  . Coronary artery disease   .  Hypercholesteremia   . Hypertension   . Myocardial infarct   . HA (headache)   . Difficulty swallowing solids   . Difficulty swallowing pills     PAST SURGICAL HISTORY: Past Surgical History  Procedure Laterality Date  . Tubal ligation    . Angioplasty    . Carotid endarterectomy    . Eye surgery Bilateral   . Carotid angiogram N/A 10/10/2013    Procedure: CAROTID ANGIOGRAM;  Surgeon: Laverda Page, MD;  Location: [facility] CATH LAB;  Service: Cardiovascular;  Laterality: N/A;    FAMILY HISTORY: Family History  Problem Relation Age of Onset  . Diabetes Mother   . Hypertension Mother   . Breast cancer Sister   . Heart attack Father     SOCIAL HISTORY:  History   Social History  . Marital Status: Widowed    Spouse Name: N/A    Number of Children: 3  . Years of Education: college   Occupational History  .      Retired Therapist, sports   Social History Main Topics  . Smoking status: Former Research scientist (life sciences)  . Smokeless tobacco: Never Used     Comment: Quit in 2000  . Alcohol Use: No  . Drug Use: No  . Sexual Activity: Not on file   Other Topics Concern  . Not on file   Social History Narrative   Patient lives at home with her son and she takes care of him- Widowed.   Retired.    Education college Retired. RN.   Right handed.   Caffeine One pot of coffee daily. Sometimes coke cola.     PHYSICAL EXAM   There were no vitals filed for this visit.  Not recorded      There is no weight on file to calculate BMI.   Generalized: In no acute distress  Neck: Supple, no carotid bruits   Cardiac: Regular rate rhythm  Pulmonary: Clear to auscultation bilaterally  Musculoskeletal: No deformity  Neurological examination  Mentation: Alert oriented to time, place, history taking, and causual conversation, tired looking elderly female  Cranial nerve II-XII: Pupils were equal round reactive to light. Extraocular movements were full.  Visual field were full on confrontational test.  Bilateral fundi were sharp.  Facial sensation and strength were normal. Hearing was intact to finger rubbing bilaterally. Uvula tongue midline.  Head turning and shoulder shrug and were normal and symmetric.Tongue protrusion into cheek strength was normal.  Motor: Normal tone, bulk and strength.  Sensory: Intact to fine touch, pinprick, preserved vibratory sensation, and proprioception at toes.  Coordination: Normal finger to nose, heel-to-shin bilaterally there was no truncal ataxia  Gait: Rising up from seated position without assistance, normal stance, without trunk ataxia, moderate stride,  good arm swing, smooth turning, able to perform tiptoe, and heel walking without difficulty.   Romberg signs: Negative  Deep tendon reflexes: Brachioradialis 2/2, biceps 2/2, triceps 2/2, patellar 2/2, Achilles 2/2, plantar responses were flexor bilaterally.   DIAGNOSTIC DATA (LABS, IMAGING, TESTING) - I reviewed patient records, labs, notes, testing and imaging myself where available.  Lab Results  Component Value Date   WBC 6.9 05/16/2008   HGB 12.6 05/16/2008   HCT 37.1 05/16/2008   MCV 91.3 05/16/2008   PLT 327 05/16/2008      Component Value Date/Time   NA 138 05/16/2008 0600   K 4.5 HEMOLYZED SPECIMEN, RESULTS MAY BE AFFECTED 05/16/2008 0600   CL 107 05/16/2008 0600   CO2 25 05/16/2008 0600   GLUCOSE 120* 05/16/2008 0600   BUN 9 05/16/2008 0600   CREATININE 0.74 05/16/2008 0600   CALCIUM 9.0 05/16/2008 0600   GFRNONAA >60 05/16/2008 0600   GFRAA  05/16/2008 0600    >60        The eGFR has been calculated using the MDRD equation. This calculation has not been validated in all clinical    ASSESSMENT AND PLAN  MARTINIQUE PIZZIMENTI is a 73 y.o. female complains of  New onset right-sided headaches, with elevated ESR, C-reactive protein, her symptoms has much improved off IV Solu-Medrol, now on prednisone 40 mg every day since August 18 2014  1, Her symptoms, and the laboratory  evaluation does support a diagnosis of temporal arteritis, continue follow-up with her primary care physician 2, keep tapering dose of prednisone  3. RTC in one month    Marcial Pacas, M.D. Ph.D.  [facility] [address], [place], [place] [address] [phone]

## 2014-10-18 DIAGNOSIS — M810 Age-related osteoporosis without current pathological fracture: Secondary | ICD-10-CM | POA: Diagnosis not present

## 2014-10-18 DIAGNOSIS — R739 Hyperglycemia, unspecified: Secondary | ICD-10-CM | POA: Diagnosis not present

## 2014-10-18 DIAGNOSIS — E559 Vitamin D deficiency, unspecified: Secondary | ICD-10-CM | POA: Diagnosis not present

## 2014-10-18 DIAGNOSIS — I10 Essential (primary) hypertension: Secondary | ICD-10-CM | POA: Diagnosis not present

## 2014-10-25 DIAGNOSIS — I10 Essential (primary) hypertension: Secondary | ICD-10-CM | POA: Diagnosis not present

## 2014-10-25 DIAGNOSIS — R000 Tachycardia, unspecified: Secondary | ICD-10-CM | POA: Diagnosis not present

## 2014-10-25 DIAGNOSIS — R739 Hyperglycemia, unspecified: Secondary | ICD-10-CM | POA: Diagnosis not present

## 2014-10-25 DIAGNOSIS — M810 Age-related osteoporosis without current pathological fracture: Secondary | ICD-10-CM | POA: Diagnosis not present

## 2014-10-26 DIAGNOSIS — I10 Essential (primary) hypertension: Secondary | ICD-10-CM | POA: Diagnosis not present

## 2014-10-26 DIAGNOSIS — E780 Pure hypercholesterolemia: Secondary | ICD-10-CM | POA: Diagnosis not present

## 2014-10-26 DIAGNOSIS — R000 Tachycardia, unspecified: Secondary | ICD-10-CM | POA: Diagnosis not present

## 2014-10-31 DIAGNOSIS — M316 Other giant cell arteritis: Secondary | ICD-10-CM | POA: Diagnosis not present

## 2014-11-07 DIAGNOSIS — H01004 Unspecified blepharitis left upper eyelid: Secondary | ICD-10-CM | POA: Diagnosis not present

## 2014-11-07 DIAGNOSIS — H01001 Unspecified blepharitis right upper eyelid: Secondary | ICD-10-CM | POA: Diagnosis not present

## 2014-11-07 DIAGNOSIS — H26493 Other secondary cataract, bilateral: Secondary | ICD-10-CM | POA: Diagnosis not present

## 2014-11-07 DIAGNOSIS — Z961 Presence of intraocular lens: Secondary | ICD-10-CM | POA: Diagnosis not present

## 2014-11-30 DIAGNOSIS — M316 Other giant cell arteritis: Secondary | ICD-10-CM | POA: Diagnosis not present

## 2014-11-30 DIAGNOSIS — R300 Dysuria: Secondary | ICD-10-CM | POA: Diagnosis not present

## 2014-12-18 DIAGNOSIS — H903 Sensorineural hearing loss, bilateral: Secondary | ICD-10-CM | POA: Diagnosis not present

## 2014-12-27 DIAGNOSIS — K640 First degree hemorrhoids: Secondary | ICD-10-CM | POA: Diagnosis not present

## 2014-12-27 DIAGNOSIS — Z1211 Encounter for screening for malignant neoplasm of colon: Secondary | ICD-10-CM | POA: Diagnosis not present

## 2014-12-27 DIAGNOSIS — K5730 Diverticulosis of large intestine without perforation or abscess without bleeding: Secondary | ICD-10-CM | POA: Diagnosis not present

## 2014-12-31 ENCOUNTER — Ambulatory Visit: Payer: Medicare Other | Admitting: Neurology

## 2015-01-17 DIAGNOSIS — I10 Essential (primary) hypertension: Secondary | ICD-10-CM | POA: Diagnosis not present

## 2015-01-17 DIAGNOSIS — E785 Hyperlipidemia, unspecified: Secondary | ICD-10-CM | POA: Diagnosis not present

## 2015-01-17 DIAGNOSIS — E559 Vitamin D deficiency, unspecified: Secondary | ICD-10-CM | POA: Diagnosis not present

## 2015-01-24 DIAGNOSIS — E780 Pure hypercholesterolemia: Secondary | ICD-10-CM | POA: Diagnosis not present

## 2015-01-24 DIAGNOSIS — I10 Essential (primary) hypertension: Secondary | ICD-10-CM | POA: Diagnosis not present

## 2015-01-24 DIAGNOSIS — E559 Vitamin D deficiency, unspecified: Secondary | ICD-10-CM | POA: Diagnosis not present

## 2015-01-31 ENCOUNTER — Ambulatory Visit: Payer: Self-pay | Admitting: Neurology

## 2015-02-28 ENCOUNTER — Ambulatory Visit: Payer: Medicare Other | Admitting: Neurology

## 2015-03-26 DIAGNOSIS — I10 Essential (primary) hypertension: Secondary | ICD-10-CM | POA: Diagnosis not present

## 2015-03-26 DIAGNOSIS — I70213 Atherosclerosis of native arteries of extremities with intermittent claudication, bilateral legs: Secondary | ICD-10-CM | POA: Diagnosis not present

## 2015-03-27 DIAGNOSIS — M79606 Pain in leg, unspecified: Secondary | ICD-10-CM | POA: Diagnosis not present

## 2015-04-10 ENCOUNTER — Encounter: Payer: Self-pay | Admitting: Neurology

## 2015-04-10 ENCOUNTER — Ambulatory Visit
Admission: RE | Admit: 2015-04-10 | Discharge: 2015-04-10 | Disposition: A | Discharge status: Home / Self Care | Payer: Medicare Other | Source: Ambulatory Visit | Attending: Neurology | Admitting: Neurology

## 2015-04-10 ENCOUNTER — Ambulatory Visit (INDEPENDENT_AMBULATORY_CARE_PROVIDER_SITE_OTHER): Payer: Medicare Other | Admitting: Neurology

## 2015-04-10 ENCOUNTER — Telehealth: Payer: Self-pay | Admitting: *Deleted

## 2015-04-10 VITALS — BP 132/66 | HR 76 | Ht 62.0 in | Wt 139.0 lb

## 2015-04-10 DIAGNOSIS — M25551 Pain in right hip: Secondary | ICD-10-CM | POA: Diagnosis not present

## 2015-04-10 DIAGNOSIS — M1611 Unilateral primary osteoarthritis, right hip: Secondary | ICD-10-CM | POA: Diagnosis not present

## 2015-04-10 NOTE — Telephone Encounter (Signed)
-----   Message from Marcial Pacas, MD sent at 04/10/2015  3:34 PM EDT ----- Please call patient for no acute osseous injury of the right hip.

## 2015-04-10 NOTE — Telephone Encounter (Signed)
Notified patient of results 

## 2015-04-10 NOTE — Progress Notes (Signed)
Chief Complaint  Patient presents with  . Temporal Arteritis    Reports her symptoms have resolved and she has been released from her rheumatologist, Dr. Ouida Sills.        PATIENT: [name] DOB: 1941/03/10  HISTORICAL  [name] is a 74 years old right-handed Caucasian female, referred by her primary care physician Dr. Ouida Sills for evaluation of temporal arteritis, frequent headaches,  In October 2015, she had experienced nausea vomiting, GI symptoms, has developed dehydration, began to have headaches then, later, she was diagnosed with UTI, was given a round of Cipro, has developed allergic reaction, with whole-body skin rash. She also developed hyponatremia, hypo-potassium, require supplements  During the process, her headache has got worse, constant right side sharp headaches, she denied previous history of headaches, was evaluated by Dr. Ouida Sills, I do not have laboratory result, per patient, elevated ESR to 70s, elevated C reactive protein, she also complained of whole body achy pain, weight loss of 20 pounds, she was diagnosed with temporal arteritis, was giving IV Solu-Medrol in November 6th 2015, her headache disappeared that evening, she is now on prednisone 40 mg every day since November seventh 2015, no headaches, overall her symptoms is much improved, no temporal artery biopsy is planned,  She is going to be followed by Dr. Ouida Sills in 3 days.  CAT scan of the brain without contrast was normal  She denies visual loss, no focal symptoms  UPDATE Dec 22nd 2015: She was seen by Dr. Ouida Sills, overall she feels better, she is on tapering dose of prednisone, 20 mg for 2 weeks, then 10 mg, she no longer has headaches,  UPDATE April 10 2015: She no longer has headaches, she complains of right side hip pain for 2 months now,  getting worse with walking, 9/10,  She has history of left leg pain in the past, was eventually diagnosed with left peripheral vascular disease, had  stent, which has improved her symptoms.  She also had history of left ECA in the past. Longtime smoker in the past, now has COPD, uses oxygen at nighttime  She recently had evaluation by her cardiologist Dr. Nadyne Coombes, Doppler study of her right lower extremity was reported normal  REVIEW OF SYSTEMS: Full 14 system review of systems performed and notable only for as above  ALLERGIES: Allergies  Allergen Reactions  . Sulfa Antibiotics Anaphylaxis  . Ivp Dye [Iodinated Diagnostic Agents] Nausea And Vomiting  . Lipitor [Atorvastatin]     Legs hurt so bad she couldn't walk  . Macrodantin Nausea And Vomiting    faint  . Meloxicam     Can't remember  . Motrin [Ibuprofen] Nausea And Vomiting  . Naprosyn [Naproxen] Nausea Only    faint  . Niaspan [Niacin Er]     Flushing, headache  . Pletal [Cilostazol]     headache  . Prednisone     confusion  . Ramipril Cough  . Vioxx [Rofecoxib]     "deathly ill"  . Ciprofloxacin Rash  . Neosporin [Neomycin-Bacitracin Zn-Polymyx] Rash  . Phenergan [Promethazine Hcl] Rash    HOME MEDICATIONS: Current Outpatient Prescriptions on File Prior to Visit  Medication Sig Dispense Refill  . aspirin 81 MG tablet Take 162 mg by mouth daily.     . bisoprolol (ZEBETA) 5 MG tablet Take 5 mg by mouth daily.    . cetirizine (ZYRTEC) 10 MG tablet Take 10 mg by mouth daily as needed for allergies.    . Cholecalciferol (VITAMIN D) 2000 UNITS  CAPS Take 2,000 Units by mouth daily.    Marland Kitchen ezetimibe (ZETIA) 10 MG tablet Take 10 mg by mouth daily.    . fluvastatin XL (LESCOL XL) 80 MG 24 hr tablet Take 80 mg by mouth daily.    . raloxifene (EVISTA) 60 MG tablet Take 60 mg by mouth daily.    . Vitamin D, Ergocalciferol, (DRISDOL) 50000 UNITS CAPS capsule Take 50,000 Units by mouth every 7 (seven) days.     No current facility-administered medications on file prior to visit.    PAST MEDICAL HISTORY: Past Medical History  Diagnosis Date  . Coronary artery disease   .  Hypercholesteremia   . Hypertension   . Myocardial infarct   . HA (headache)   . Difficulty swallowing solids   . Difficulty swallowing pills     PAST SURGICAL HISTORY: Past Surgical History  Procedure Laterality Date  . Tubal ligation    . Angioplasty    . Carotid endarterectomy    . Eye surgery Bilateral   . Carotid angiogram N/A 10/10/2013    Procedure: CAROTID ANGIOGRAM;  Surgeon: Laverda Page, MD;  Location: [facility] CATH LAB;  Service: Cardiovascular;  Laterality: N/A;    FAMILY HISTORY: Family History  Problem Relation Age of Onset  . Diabetes Mother   . Hypertension Mother   . Breast cancer Sister   . Heart attack Father     SOCIAL HISTORY:  History   Social History  . Marital Status: Widowed    Spouse Name: N/A  . Number of Children: 3  . Years of Education: college   Occupational History  .      Retired Therapist, sports   Social History Main Topics  . Smoking status: Former Research scientist (life sciences)  . Smokeless tobacco: Never Used     Comment: Quit in 2000  . Alcohol Use: No  . Drug Use: No  . Sexual Activity: Not on file   Other Topics Concern  . Not on file   Social History Narrative   Patient lives at home with her son and she takes care of him- Widowed.   Retired.    Education college Retired. RN.   Right handed.   Caffeine One pot of coffee daily. Sometimes coke cola.     PHYSICAL EXAM   Filed Vitals:   04/10/15 1332  BP: 132/66  Pulse: 76  Height: 5' 2" (1.575 m)  Weight: 139 lb (63.05 kg)    Not recorded      Body mass index is 25.42 kg/(m^2).   Generalized: In no acute distress  Neck: Supple, no carotid bruits   Cardiac: Regular rate rhythm  Pulmonary: Clear to auscultation bilaterally  Musculoskeletal: No deformity  Neurological examination  Mentation: Alert oriented to time, place, history taking, and causual conversation, tired looking elderly female  Cranial nerve II-XII: Pupils were equal round reactive to light. Extraocular movements  were full.  Visual field were full on confrontational test. Bilateral fundi were sharp.  Facial sensation and strength were normal. Hearing was intact to finger rubbing bilaterally. Uvula tongue midline.  Head turning and shoulder shrug and were normal and symmetric.Tongue protrusion into cheek strength was normal.  Motor: Normal tone, bulk and strength.  Sensory: Intact to fine touch, pinprick, preserved vibratory sensation, and proprioception at toes.  Coordination: Normal finger to nose, heel-to-shin bilaterally there was no truncal ataxia  Gait: Rising up from seated position without assistance, normal stance, without trunk ataxia, moderate stride, good arm swing, smooth turning, able to  perform tiptoe, and heel walking without difficulty.   Romberg signs: Negative  Deep tendon reflexes: Brachioradialis 2/2, biceps 2/2, triceps 2/2, patellar 2/2, Achilles 2/2, plantar responses were flexor bilaterally.   DIAGNOSTIC DATA (LABS, IMAGING, TESTING) - I reviewed patient records, labs, notes, testing and imaging myself where available.  Lab Results  Component Value Date   WBC 6.9 05/16/2008   HGB 12.6 05/16/2008   HCT 37.1 05/16/2008   MCV 91.3 05/16/2008   PLT 327 05/16/2008      Component Value Date/Time   NA 138 05/16/2008 0600   K 4.5 HEMOLYZED SPECIMEN, RESULTS MAY BE AFFECTED 05/16/2008 0600   CL 107 05/16/2008 0600   CO2 25 05/16/2008 0600   GLUCOSE 120* 05/16/2008 0600   BUN 9 05/16/2008 0600   CREATININE 0.74 05/16/2008 0600   CALCIUM 9.0 05/16/2008 0600   GFRNONAA >60 05/16/2008 0600   GFRAA  05/16/2008 0600    >60        The eGFR has been calculated using the MDRD equation. This calculation has not been validated in all clinical    ASSESSMENT AND PLAN  [name] is a 74 y.o. female complains of  New onset right-sided headaches, with elevated ESR, C-reactive protein, her symptoms has much improved off IV Solu-Medrol, was treated with tapering dose of  prednisone in November 2015,  She is now complains 2 months history of severe right hip pain, x-ray of right hip, I will call her result,    Marcial Pacas, M.D. Ph.D.  [facility] [address] [place], [place] [address] [phone]

## 2015-05-28 ENCOUNTER — Other Ambulatory Visit (HOSPITAL_COMMUNITY): Payer: Self-pay | Admitting: Internal Medicine

## 2015-05-28 DIAGNOSIS — Z1231 Encounter for screening mammogram for malignant neoplasm of breast: Secondary | ICD-10-CM

## 2015-06-20 ENCOUNTER — Ambulatory Visit (HOSPITAL_COMMUNITY)
Admission: RE | Admit: 2015-06-20 | Discharge: 2015-06-20 | Disposition: A | Discharge status: Home / Self Care | Payer: Medicare Other | Source: Ambulatory Visit | Attending: Internal Medicine | Admitting: Internal Medicine

## 2015-06-20 DIAGNOSIS — Z1231 Encounter for screening mammogram for malignant neoplasm of breast: Secondary | ICD-10-CM | POA: Diagnosis not present

## 2015-08-01 DIAGNOSIS — N390 Urinary tract infection, site not specified: Secondary | ICD-10-CM | POA: Diagnosis not present

## 2015-08-01 DIAGNOSIS — E559 Vitamin D deficiency, unspecified: Secondary | ICD-10-CM | POA: Diagnosis not present

## 2015-08-01 DIAGNOSIS — I10 Essential (primary) hypertension: Secondary | ICD-10-CM | POA: Diagnosis not present

## 2015-08-08 DIAGNOSIS — E559 Vitamin D deficiency, unspecified: Secondary | ICD-10-CM | POA: Diagnosis not present

## 2015-08-08 DIAGNOSIS — I10 Essential (primary) hypertension: Secondary | ICD-10-CM | POA: Diagnosis not present

## 2015-08-08 DIAGNOSIS — R1030 Lower abdominal pain, unspecified: Secondary | ICD-10-CM | POA: Diagnosis not present

## 2015-08-08 DIAGNOSIS — E7800 Pure hypercholesterolemia, unspecified: Secondary | ICD-10-CM | POA: Diagnosis not present

## 2015-08-13 ENCOUNTER — Other Ambulatory Visit: Payer: Self-pay | Admitting: Internal Medicine

## 2015-08-13 DIAGNOSIS — R1031 Right lower quadrant pain: Secondary | ICD-10-CM

## 2015-08-13 DIAGNOSIS — R0989 Other specified symptoms and signs involving the circulatory and respiratory systems: Secondary | ICD-10-CM

## 2015-08-13 DIAGNOSIS — I739 Peripheral vascular disease, unspecified: Secondary | ICD-10-CM

## 2015-08-16 ENCOUNTER — Ambulatory Visit
Admission: RE | Admit: 2015-08-16 | Discharge: 2015-08-16 | Disposition: A | Discharge status: Home / Self Care | Payer: Medicare Other | Source: Ambulatory Visit | Attending: Internal Medicine | Admitting: Internal Medicine

## 2015-08-16 DIAGNOSIS — R1031 Right lower quadrant pain: Secondary | ICD-10-CM

## 2015-08-16 DIAGNOSIS — R0989 Other specified symptoms and signs involving the circulatory and respiratory systems: Secondary | ICD-10-CM

## 2015-08-16 DIAGNOSIS — I739 Peripheral vascular disease, unspecified: Secondary | ICD-10-CM

## 2015-08-16 DIAGNOSIS — I700 Atherosclerosis of aorta: Secondary | ICD-10-CM | POA: Diagnosis not present

## 2015-08-16 DIAGNOSIS — R109 Unspecified abdominal pain: Secondary | ICD-10-CM | POA: Diagnosis not present

## 2015-08-16 MED ORDER — IOPAMIDOL (ISOVUE-370) INJECTION 76%
75.0000 mL | Freq: Once | INTRAVENOUS | Status: AC | PRN
  Administered 2015-08-16: 75 mL via INTRAVENOUS

## 2015-08-27 DIAGNOSIS — R011 Cardiac murmur, unspecified: Secondary | ICD-10-CM | POA: Diagnosis not present

## 2015-09-10 DIAGNOSIS — E7800 Pure hypercholesterolemia, unspecified: Secondary | ICD-10-CM | POA: Diagnosis not present

## 2015-09-10 DIAGNOSIS — R739 Hyperglycemia, unspecified: Secondary | ICD-10-CM | POA: Diagnosis not present

## 2015-09-10 DIAGNOSIS — I739 Peripheral vascular disease, unspecified: Secondary | ICD-10-CM | POA: Diagnosis not present

## 2015-09-10 DIAGNOSIS — I10 Essential (primary) hypertension: Secondary | ICD-10-CM | POA: Diagnosis not present

## 2015-09-12 DIAGNOSIS — E7800 Pure hypercholesterolemia, unspecified: Secondary | ICD-10-CM | POA: Diagnosis not present

## 2015-09-12 DIAGNOSIS — I739 Peripheral vascular disease, unspecified: Secondary | ICD-10-CM | POA: Diagnosis not present

## 2015-09-12 DIAGNOSIS — I2510 Atherosclerotic heart disease of native coronary artery without angina pectoris: Secondary | ICD-10-CM | POA: Diagnosis not present

## 2015-09-12 DIAGNOSIS — I10 Essential (primary) hypertension: Secondary | ICD-10-CM | POA: Diagnosis not present

## 2015-09-12 DIAGNOSIS — Z79899 Other long term (current) drug therapy: Secondary | ICD-10-CM | POA: Diagnosis not present

## 2015-09-18 DIAGNOSIS — E7800 Pure hypercholesterolemia, unspecified: Secondary | ICD-10-CM | POA: Diagnosis not present

## 2015-09-18 DIAGNOSIS — I70213 Atherosclerosis of native arteries of extremities with intermittent claudication, bilateral legs: Secondary | ICD-10-CM | POA: Diagnosis not present

## 2015-09-18 DIAGNOSIS — I10 Essential (primary) hypertension: Secondary | ICD-10-CM | POA: Diagnosis not present

## 2015-09-26 DIAGNOSIS — I6523 Occlusion and stenosis of bilateral carotid arteries: Secondary | ICD-10-CM | POA: Diagnosis not present

## 2015-10-01 DIAGNOSIS — I70213 Atherosclerosis of native arteries of extremities with intermittent claudication, bilateral legs: Secondary | ICD-10-CM | POA: Diagnosis not present

## 2015-10-05 DIAGNOSIS — R55 Syncope and collapse: Secondary | ICD-10-CM

## 2015-10-05 HISTORY — DX: Syncope and collapse: R55

## 2015-10-07 DIAGNOSIS — I739 Peripheral vascular disease, unspecified: Secondary | ICD-10-CM

## 2015-10-07 NOTE — H&P (Signed)
OFFICE VISIT NOTES COPIED TO EPIC FOR DOCUMENTATION  [name] 09/18/2015 8:29 AM Location: [name] Patient #: [id number] DOB: April 17, 1941 Widowed / Language: [name] / Race: White Female   History of Present Illness [name] AGNP-C; 09/19/2015 8:36 AM) The patient is a 74 year old female who presents for a Follow-up for Peripheral vascular disease. She has a history of known coronary artery disease and myocardial infarction in 1999. She also has had another MI in 2001. She has history of right coronary artery stent placed in 1999. She has left carotid endarterectomy in 2000, and she has known left carotid artery stenosis. She also has history of left iliac artery stenting and right SFA stenting and left subclavian artery stenting in the remote past. Other significant past medical history includes hyperglycemia and hypercholesterolemia.  She had been seen in June for right hip pain and underwent lower extremity duplex which revealed normal perfusion of the lower extremities with triphasic waveforms. She had a hip x-ray to evaluate for orthopedic etiology, particularly in light of recent prednisone use, however this was normal. Her PCP then ordered a CTA of the abdomen which revealed "suspected hemodynamically significant narrowings involving the SMA, the solitary right renal artery as well as the dominant left renal artery. Left common iliac arterial stent widely patent. Suspected hemodynamically significant narrowings involving the origin of the right common and external iliac arteries as well as the distal aspect of the right common femoral artery. Suspected hemodynamically significant narrowing involving the origin of the left external iliac artery." She was advised to return for reevaluation. Continues to report persistent right hip pain, worse with ambulation with radiation to right thigh. No recent weight changes. No PND or orthopnea, palpitations, dizziness or  syncope.   Problem List/Past Medical ([name], [name]; 09/19/2015 8:39 AM) Atherosclerosis of native artery of both lower extremities with intermittent claudication (I70.213) CT angiogram abdomen and pelvis 08/16/2015: Large amount of mixed calcified and noncalcified atherosclerotic plaque within a normal caliber abdominal aorta. No dissection. Suspected hemodynamically significant narrowings involving the SMA, the solitary right renal artery as well as the dominant left renal artery. Left common iliac arterial stent widely patent. Suspected hemodynamically significant narrowings involving the origin of the right common and external iliac arteries as well as the distal aspect of the right common femoral artery. Suspected hemodynamically significant narrowing involving the origin of the left external iliac artery. Right SFA stent 2009; LCIA stent 8.0x39 mm stent. Abdominal aortogram 10/10/2013: Moderate atherosclerotic changes of the abdominal aorta, bilateral iliac artery mild to moderate diffuse disease without high-grade stenosis. Left iliac stent widely patent. Abdominal aortogram 10/10/2013: Mild disease in bilateral iliac arteries constituting 20-30% ostial stenosis. Left common iliac artery stent widely patent. 30-40% stenosis of the right common and external iliac artery with calcification. Left subclavian arterial duplex 07/05/11: < 50% stenosis of bilateral subclavian arteries, left > right. No change 03/16/11 Normal BP in both arms. Hence clinically not important. Asymptomatic stenosis of left carotid artery (I65.22) Left Carotid Endarectomy in 2000. Carotid angiogram 10/10/2013: Right carotid artery mild disease, bovine arch, left distal common carotid artery proximal to patch endarterectomy shows 50% to 60% stenosis with a 20 mmHg pressure gradient. Left subclavian artery stent widely patent. Medical therapy for now. Pure hypercholesterolemia (E78.00) Postsurgical percutaneous transluminal  coronary angioplasty status (Z98.61) Essential hypertension, benign (I10) H/O carotid endarterectomy [id number]) Left Carotid Endarectomy in 2000. Atherosclerosis of native coronary artery of native heart without angina pectoris (I25.10) MI in 1999; Stent in  right coronary artery in 1999; MI in 2001; Patent stent at that time. Nuclear Stress: 10/10/10 Positive EKG and normal nuclear perfusion images, normal EF. 4.9 METS. Hyperglycemia (R73.9) Sinus tachycardia by electrocardiogram (R00.0) Other symptoms involving cardiovascular system (785.9) History of esophageal stricture (Z87.19) Follows Dr. Earlie Hopkins. Last eophageal dilatation 06/2014 Subclavian artery stenosis, left (I77.1)09/28/1999 Left subclavian artery stenting in 2000 Temporal arteritis (M31.6)08/12/2014  Allergies ([name]; September 26, 2015 12:49 PM) Sulfa 10 *OPHTHALMIC AGENTS* Anaphylaxis. Cipro *FLUOROQUINOLONES* Rash. Phenergan *ANTIHISTAMINES* Rash. Cilostazol *HEMATOLOGICAL AGENTS - MISC.* Cough. Niaspan *ANTIHYPERLIPIDEMICS* Resulting in Unknown Lipitor *ANTIHYPERLIPIDEMICS* Resulting in Unknown Zocor *ANTIHYPERLIPIDEMICS* Resulting in Unknown Pravachol *ANTIHYPERLIPIDEMICS* Resulting in Unknown NSAID Resulting in Unknown IVP Dye Resulting in Unknown  Family History [name]; 26-Sep-2015 12:49 PM) Father Deceased. at age 16 from an MI Brother 1 Deceased. at age 3 from Meningitis Mother Deceased. at age 34 from complications from DM Sister 1 living; H/O Breast Cancer  Social History [name]; 09/26/15 12:49 PM) Current tobacco use Former smoker. quit in 2001 Non Drinker/No Alcohol Use Marital status Widowed. Living Situation Son lives with her Number of Children 3.  Past Surgical History ([name]; 2015/09/26 12:49 PM) MI in 1999; Stent in right coronary artery in 1999; Stent in left subclavian artery in 2000, Possible same time Right SFA stent. ; MI in  2001; Patent stent and otherwise normal.  Left Carotid Endarectomy in 2000.  2009 LCIA stent 8.0x39 . Severe atheresclerotic changes of abdominal aorta. Patent right SFA stent, 3 vessel runoff below both knee.  Medication History ([name]; September 26, 2015 1:01 PM) Bisoprolol Fumarate (5MG  Tablet, 1 (one) Tablet Oral at bedtime, Taken starting 03/26/2015) Active. Lescol XL (80MG  Tablet ER 24HR, 1 Oral in the evening) Active. Zetia (10MG  Tablet, 1 Oral daily) Active. Aspirin (81MG  Tablet, 2 Oral once a day) Active. Evista (60MG  Tablet, 1 Oral daily) Active. Vitamin D3 (2000UNIT Capsule, 1 Oral daily) Active. Vitamin D (50000U Capsule, 1 Oral weekly) Active. Medications Reconciled (verbally with pt/ no list or medications present)  Diagnostic Studies History [name], AGNP-C; 09/19/2015 8:40 AM) Echocardiogram11/15/2016 1. Left ventricle cavity is normal in size. Normal global wall motion. Visual EF is 55-60%. Doppler evidence of grade I (impaired) diastolic dysfunction. Calculated EF 55%. 2. Left atrial cavity is at upper limits of normal in size. 3. Trace aortic regurgitation. 4. Mild to moderate mitral regurgitation. 5. Mild tricuspid regurgitation. No evidence of pulmonary hypertension. Carotid Doppler06/14/2016 Moderate stenosis in the left external carotid artery (>50%). Severe stenosis in the left internal carotid artery (50-69%). Mixed plaque noted in both carotid arteries. Compared to 09/19/2014, left ICA velocity increased mildly. Follow up in six months is appropriate if clinically indicated. Lower Extremity Doppler06/15/2016 No hemodynamically significant stenoses are identified in the bilateral lower extremity arterial system.This exam reveals mildly decreased perfusion of the right lower extremity, noted at the post tibial artery level with ABI 0.96. This exam reveals normal perfusion of the left lower extremity with ABI 0.99. Study suggests patent right SFA and  left iliac stent. No significant change from 03/26/2014. Mild diffuse mixed plaque noted bilaterally. Triphasic waveform through out the legs. CT Angiogram 08/16/2015 abdomen and pelvis : Large amount of mixed calcified and noncalcified atherosclerotic plaque within a normal caliber abdominal aorta. No dissection. Suspected hemodynamically significant narrowings involving the SMA, the solitary right renal artery as well as the dominant left renal artery. Left common iliac arterial stent widely patent. Suspected hemodynamically significant narrowings involving the origin of the right common and external iliac  arteries as well as the distal aspect of the right common femoral artery. Suspected hemodynamically significant narrowing involving the origin of the left external iliac artery. Labwork 10/18/2014: CBC normal, creatinine 1.1, CMP normal, HbA1c 6.2%, total cholesterol 288, triglycerides 134, HDL 182, LDL 159, vitamin D 18.4 09/28/2014: CRP 18.4 07/27/2013: BUN 13, serum creatinine 0.9, CMP normal. HbA1c 5.9%. Total cholesterol 161, triglycerides 81, HDL 70, LDL 75. Non-HDL cholesterol 91. Vitamin D 34.5. Labs are within normal limits. Labs 10/03/2013: CBC, CMP, PT, PTT normal.  Other Problems [name], AGNP-C; 09/19/2015 8:39 AM) Preoperative cardiovascular examination (Z01.810)    Review of Systems (Melissa Allison AGNP-C; 09/18/2015 2:45 PM) General Not Present- Fatigue, Fever and Weight Gain. Respiratory Not Present- Bloody sputum and Hemoptysis. Cardiovascular Present- Claudications. Not Present- Chest Pain and Fainting. Gastrointestinal Not Present- Abdominal Pain, Black, Tarry Stool, Bloody Stool and Difficulty Swallowing. Musculoskeletal Present- Joint Pain (Right hip pain.). Not Present- Leg Cramps and Leg Weakness. Neurological Not Present- Focal Neurological Symptoms. Hematology Not Present- Abnormal Bleeding, Easy Bleeding and Easy Bruising. All other systems  negative  Vitals [name]; 09/18/2015 12:55 PM) 09/18/2015 12:54 PM Weight: 141.06 lb Height: 64in Body Surface Area: 1.69 m Body Mass Index: 24.21 kg/m  Pulse: 89 (Regular)  P.OX: 97% (Room air) BP: 118/76 (Sitting, Right Arm, Standard)    09/18/2015 12:52 PM Weight: 141.06 lb Height: 64in Body Surface Area: 1.69 m Body Mass Index: 24.21 kg/m  Pulse: 90 (Regular)  P.OX: 96% (Room air) BP: 122/78 (Sitting, Left Arm, Standard)       Physical Exam ([name] AGNP-C; 09/18/2015 2:47 PM) General Mental Status-Alert. General Appearance-Cooperative, Appears stated age, Not in acute distress. Orientation-Oriented X3. Build & Nutrition-Well nourished and Moderately built.  Head and Neck Thyroid Gland Characteristics - no palpable nodules, no palpable enlargement.  Chest and Lung Exam Palpation Tender - No chest wall tenderness. Auscultation Breath sounds - Clear.  Cardiovascular Inspection Jugular vein - Right - No Distention. Auscultation Heart Sounds - S1 WNL, S2 WNL and No gallop present. Murmurs & Other Heart Sounds - Murmur - No murmur.  Abdomen Palpation/Percussion Palpation and Percussion of the abdomen reveal - Non Tender and No hepatosplenomegaly. Auscultation Auscultation of the abdomen reveals - Bowel sounds normal.  Peripheral Vascular Lower Extremity Inspection - Left - No Pigmentation, No Varicose veins. Right - No Pigmentation, No Varicose veins. Palpation - Edema - Left - No edema. Right - No edema. Femoral pulse - Bilateral - 1+ and Bruit. Popliteal pulse - Left - Normal. Right - Normal. Dorsalis pedis pulse - Left - Normal. Right - Normal. Posterior tibial pulse - Left - Normal. Right - Normal. Carotid arteries - Bilateral-Soft Bruit. Abdomen-No prominent abdominal aortic pulsation, No epigastric bruit.  Neurologic Motor-Grossly intact without any focal deficits.  Musculoskeletal Global  Assessment Left Lower Extremity - normal range of motion without pain. Right Lower Extremity - normal range of motion without pain.    Assessment & Plan (Melissa Allison AGNP-C; 09/19/2015 8:40 AM) Essential hypertension, benign (I10) Impression: EKG 03/26/2015: Sinus rhythm at a rate of 87 bpm, normal axis, normal intervals, no evidence of ischemia. Atherosclerosis of native artery of both lower extremities with intermittent claudication (I70.213) Story: CT angiogram abdomen and pelvis 08/16/2015: Large amount of mixed calcified and noncalcified atherosclerotic plaque within a normal caliber abdominal aorta. No dissection. Suspected hemodynamically significant narrowings involving the SMA, the solitary right renal artery as well as the dominant left renal artery. Left common iliac arterial stent widely patent. Suspected  hemodynamically significant narrowings involving the origin of the right common and external iliac arteries as well as the distal aspect of the right common femoral artery. Suspected hemodynamically significant narrowing involving the origin of the left external iliac artery.  Right SFA stent 2009; LCIA stent 8.0x39 mm stent. Abdominal aortogram 10/10/2013: Moderate atherosclerotic changes of the abdominal aorta, bilateral iliac artery mild to moderate diffuse disease without high-grade stenosis. Left iliac stent widely patent.  Abdominal aortogram 10/10/2013: Mild disease in bilateral iliac arteries constituting 20-30% ostial stenosis. Left common iliac artery stent widely patent. 30-40% stenosis of the right common and external iliac artery with calcification.  Left subclavian arterial duplex 07/05/11: < 50% stenosis of bilateral subclavian arteries, left > right. No change 03/16/11 Normal BP in both arms. Hence clinically not important. Impression: LE arterial duplex 08/15/12: RABI 0.94, LABI 0.92. 1. No hemodynamically significant stenosis is identified on either side. This exam  reveals mildly decreased perfusion of both lower extremities noted at the post tibial artery level. 2. The right SFA and left Common iliac Artery (not visualized) stents are patent hemodynamically. No significant change since June 2012 study. Future Plans 09/26/2015: Complete duplex scan of both carotid arteries [id number]) - one time 09/27/2015: CBC & PLATELETS (AUTO) [id number]) - one time 0000000: METABOLIC PANEL, BASIC (99991111) - one time 09/27/2015: PT (PROTHROMBIN TIME) (16109) - one time Pure hypercholesterolemia (E78.00) Current Plans Mechanism of underlying disease process and action of medications discussed with the patient. I discussed primary/secondary prevention and also dietary counseling was done. She presents for follow-up and reevaluation of right hip pain after undergoing CTA that revealed multiple hemodynamically significant narrowings involving the SMA, bilateral renal arteries, right common and external iliac arteries, distal right common femoral artery, and left external iliac artery. Physical exam remains unchanged with pulses present bilaterally and no evidence of limb threatening ischemia, however due to persistent, activity limiting pain and abnormal CTA, will schedule for angiogram for further evaluation. Patient understands the risks, benefits, and alternatives including continued medical therapy. Patient understands <1-2% risk of death, embolic complications, bleeding, infection, renal failure, urgent surgical revascularization, but not limited to these and wants to proceed. Follow up after procedure for reevaluation and further recommendations.  *I have discussed this case with Dr. Einar Gip and he personally examined the patient and participated in formulating the plan.*  Addendum Note(Melissa Allison AGNP-C; 10/01/2015 7:35 PM) 10/01/2015: CBC normal, creatinine 0.80, potassium 4.8, PT/INR normal  Signed by [name], AGNP-C (09/19/2015 8:40 AM)

## 2015-10-08 ENCOUNTER — Ambulatory Visit (HOSPITAL_COMMUNITY)
Admission: RE | Admit: 2015-10-08 | Discharge: 2015-10-08 | Disposition: A | Discharge status: Home / Self Care | Payer: Medicare Other | Source: Ambulatory Visit | Attending: Cardiology | Admitting: Cardiology

## 2015-10-08 ENCOUNTER — Encounter (HOSPITAL_COMMUNITY)
Admission: RE | Disposition: A | Discharge status: Home / Self Care | Payer: Self-pay | Source: Ambulatory Visit | Attending: Cardiology

## 2015-10-08 ENCOUNTER — Ambulatory Visit (HOSPITAL_COMMUNITY): Payer: Medicare Other

## 2015-10-08 DIAGNOSIS — Z95820 Peripheral vascular angioplasty status with implants and grafts: Secondary | ICD-10-CM | POA: Insufficient documentation

## 2015-10-08 DIAGNOSIS — Z5309 Procedure and treatment not carried out because of other contraindication: Secondary | ICD-10-CM | POA: Insufficient documentation

## 2015-10-08 DIAGNOSIS — I70213 Atherosclerosis of native arteries of extremities with intermittent claudication, bilateral legs: Secondary | ICD-10-CM | POA: Diagnosis not present

## 2015-10-08 DIAGNOSIS — Z7982 Long term (current) use of aspirin: Secondary | ICD-10-CM | POA: Insufficient documentation

## 2015-10-08 DIAGNOSIS — I708 Atherosclerosis of other arteries: Secondary | ICD-10-CM | POA: Insufficient documentation

## 2015-10-08 DIAGNOSIS — I700 Atherosclerosis of aorta: Secondary | ICD-10-CM | POA: Diagnosis not present

## 2015-10-08 DIAGNOSIS — I2510 Atherosclerotic heart disease of native coronary artery without angina pectoris: Secondary | ICD-10-CM | POA: Insufficient documentation

## 2015-10-08 DIAGNOSIS — I252 Old myocardial infarction: Secondary | ICD-10-CM | POA: Diagnosis not present

## 2015-10-08 DIAGNOSIS — Z87891 Personal history of nicotine dependence: Secondary | ICD-10-CM | POA: Diagnosis not present

## 2015-10-08 DIAGNOSIS — R739 Hyperglycemia, unspecified: Secondary | ICD-10-CM | POA: Diagnosis not present

## 2015-10-08 DIAGNOSIS — W19XXXA Unspecified fall, initial encounter: Secondary | ICD-10-CM

## 2015-10-08 DIAGNOSIS — I6522 Occlusion and stenosis of left carotid artery: Secondary | ICD-10-CM | POA: Insufficient documentation

## 2015-10-08 DIAGNOSIS — I739 Peripheral vascular disease, unspecified: Secondary | ICD-10-CM

## 2015-10-08 DIAGNOSIS — I10 Essential (primary) hypertension: Secondary | ICD-10-CM | POA: Insufficient documentation

## 2015-10-08 DIAGNOSIS — M316 Other giant cell arteritis: Secondary | ICD-10-CM | POA: Diagnosis not present

## 2015-10-08 DIAGNOSIS — R55 Syncope and collapse: Secondary | ICD-10-CM | POA: Diagnosis not present

## 2015-10-08 DIAGNOSIS — Z955 Presence of coronary angioplasty implant and graft: Secondary | ICD-10-CM | POA: Diagnosis not present

## 2015-10-08 DIAGNOSIS — S0990XA Unspecified injury of head, initial encounter: Secondary | ICD-10-CM | POA: Diagnosis not present

## 2015-10-08 DIAGNOSIS — E7800 Pure hypercholesterolemia, unspecified: Secondary | ICD-10-CM | POA: Insufficient documentation

## 2015-10-08 DIAGNOSIS — R51 Headache: Secondary | ICD-10-CM | POA: Diagnosis not present

## 2015-10-08 SURGERY — LOWER EXTREMITY ANGIOGRAPHY
Anesthesia: LOCAL

## 2015-10-08 MED ORDER — SODIUM CHLORIDE 0.9 % IV SOLN
INTRAVENOUS | Status: DC
  Administered 2015-10-08: 07:00:00 via INTRAVENOUS

## 2015-10-08 NOTE — Progress Notes (Signed)
Patient states she has had 2 falls over the weekend.  One episode was while she was standing in line at [employer] and passed out.  The other episode happened while she was in the shower last night passed out and hit her head.  She states she did not seek medical care for either event.  Notified Dr Einar Gip, new orders received for CT of head

## 2015-10-08 NOTE — Progress Notes (Signed)
Patient had what appears to be Neurocardiogenic syncope on 10/05/2015 while standing in line at [employer]. Had a fall in the shower last night and states, lost balance and fell. Has a scalp hematoma on exam. No other gross physical abnormality. Will set up CT head to exclude any mass effect and IC bleed. Cancel PV procedure for today,. Met family and patient. Will reschedule.

## 2015-11-06 DIAGNOSIS — I70213 Atherosclerosis of native arteries of extremities with intermittent claudication, bilateral legs: Secondary | ICD-10-CM | POA: Diagnosis not present

## 2015-11-10 NOTE — H&P (Signed)
OFFICE VISIT NOTES COPIED TO EPIC FOR DOCUMENTATION  [name] 09/18/2015 8:29 AM Location: [facility] Patient #: [id number] DOB: 06/17/1941 Widowed / Language: [name] / Race: White Female   History of Present Illness [name] AGNP-C; 09/19/2015 8:36 AM) The patient is a 75 year old female who presents for a Follow-up for Peripheral vascular disease. She has a history of known coronary artery disease and myocardial infarction in 1999. She also has had another MI in 2001. She has history of right coronary artery stent placed in 1999. She has left carotid endarterectomy in 2000, and she has known left carotid artery stenosis. She also has history of left iliac artery stenting and right SFA stenting and left subclavian artery stenting in the remote past. Other significant past medical history includes hyperglycemia and hypercholesterolemia.  She had been seen in June for right hip pain and underwent lower extremity duplex which revealed normal perfusion of the lower extremities with triphasic waveforms. She had a hip x-ray to evaluate for orthopedic etiology, particularly in [place] of recent prednisone use, however this was normal. Her PCP then ordered a CTA of the abdomen which revealed "suspected hemodynamically significant narrowings involving the SMA, the solitary right renal artery as well as the dominant left renal artery. Left common iliac arterial stent widely patent. Suspected hemodynamically significant narrowings involving the origin of the right common and external iliac arteries as well as the distal aspect of the right common femoral artery. Suspected hemodynamically significant narrowing involving the origin of the left external iliac artery." She was advised to return for reevaluation. Continues to report persistent right hip pain, worse with ambulation with radiation to right thigh. No recent weight changes. No PND or orthopnea, palpitations, dizziness or  syncope.   Problem List/Past Medical ([name], [place]; 09/19/2015 8:39 AM) Atherosclerosis of native artery of both lower extremities with intermittent claudication (I70.213) CT angiogram abdomen and pelvis 08/16/2015: Large amount of mixed calcified and noncalcified atherosclerotic plaque within a normal caliber abdominal aorta. No dissection. Suspected hemodynamically significant narrowings involving the SMA, the solitary right renal artery as well as the dominant left renal artery. Left common iliac arterial stent widely patent. Suspected hemodynamically significant narrowings involving the origin of the right common and external iliac arteries as well as the distal aspect of the right common femoral artery. Suspected hemodynamically significant narrowing involving the origin of the left external iliac artery. Right SFA stent 2009; LCIA stent 8.0x39 mm stent. Abdominal aortogram 10/10/2013: Moderate atherosclerotic changes of the abdominal aorta, bilateral iliac artery mild to moderate diffuse disease without high-grade stenosis. Left iliac stent widely patent. Abdominal aortogram 10/10/2013: Mild disease in bilateral iliac arteries constituting 20-30% ostial stenosis. Left common iliac artery stent widely patent. 30-40% stenosis of the right common and external iliac artery with calcification. Left subclavian arterial duplex 07/05/11: < 50% stenosis of bilateral subclavian arteries, left > right. No change 03/16/11 Normal BP in both arms. Hence clinically not important. Asymptomatic stenosis of left carotid artery (I65.22) Left Carotid Endarectomy in 2000. Carotid angiogram 10/10/2013: Right carotid artery mild disease, bovine arch, left distal common carotid artery proximal to patch endarterectomy shows 50% to 60% stenosis with a 20 mmHg pressure gradient. Left subclavian artery stent widely patent. Medical therapy for now. Pure hypercholesterolemia (E78.00) Postsurgical percutaneous transluminal  coronary angioplasty status (Z98.61) Essential hypertension, benign (I10) H/O carotid endarterectomy [id number]) Left Carotid Endarectomy in 2000. Atherosclerosis of native coronary artery of native heart without angina pectoris (I25.10) MI in 1999; Stent in  right coronary artery in 1999; MI in 2001; Patent stent at that time. Nuclear Stress: 10/10/10 Positive EKG and normal nuclear perfusion images, normal EF. 4.9 METS. Hyperglycemia (R73.9) Sinus tachycardia by electrocardiogram (R00.0) Other symptoms involving cardiovascular system (785.9) History of esophageal stricture (Z87.19) Follows Dr. Earlie Raveling. Last eophageal dilatation 06/2014 Subclavian artery stenosis, left (I77.1)09/28/1999 Left subclavian artery stenting in 2000 Temporal arteritis (M31.6)08/12/2014  Allergies ([name]; 30-Sep-2015 12:49 PM) Sulfa 10 *OPHTHALMIC AGENTS* Anaphylaxis. Cipro *FLUOROQUINOLONES* Rash. Phenergan *ANTIHISTAMINES* Rash. Cilostazol *HEMATOLOGICAL AGENTS - MISC.* Cough. Niaspan *ANTIHYPERLIPIDEMICS* Resulting in Unknown Lipitor *ANTIHYPERLIPIDEMICS* Resulting in Unknown Zocor *ANTIHYPERLIPIDEMICS* Resulting in Unknown Pravachol *ANTIHYPERLIPIDEMICS* Resulting in Unknown NSAID Resulting in Unknown IVP Dye Resulting in Unknown  Family History [name]; September 30, 2015 12:49 PM) Father Deceased. at age 3 from an MI Brother 1 Deceased. at age 86 from Meningitis Mother Deceased. at age 41 from complications from DM Sister 1 living; H/O Breast Cancer  Social History [name]; 2015/09/30 12:49 PM) Current tobacco use Former smoker. quit in 2001 Non Drinker/No Alcohol Use Marital status Widowed. Living Situation Son lives with her Number of Children 3.  Past Surgical History ([name]; 09/30/15 12:49 PM) MI in 1999; Stent in right coronary artery in 1999; Stent in left subclavian artery in 2000, Possible same time Right SFA stent. ; MI in  2001; Patent stent and otherwise normal.  Left Carotid Endarectomy in 2000.  2009 LCIA stent 8.0x39 . Severe atheresclerotic changes of abdominal aorta. Patent right SFA stent, 3 vessel runoff below both knee.  Medication History ([name]; 09/30/15 1:01 PM) Bisoprolol Fumarate (5MG  Tablet, 1 (one) Tablet Oral at bedtime, Taken starting 03/26/2015) Active. Lescol XL (80MG  Tablet ER 24HR, 1 Oral in the evening) Active. Zetia (10MG  Tablet, 1 Oral daily) Active. Aspirin (81MG  Tablet, 2 Oral once a day) Active. Evista (60MG  Tablet, 1 Oral daily) Active. Vitamin D3 (2000UNIT Capsule, 1 Oral daily) Active. Vitamin D (50000U Capsule, 1 Oral weekly) Active. Medications Reconciled (verbally with pt/ no list or medications present)  Diagnostic Studies History [name], AGNP-C; 09/19/2015 8:40 AM) Echocardiogram11/15/2016 1. Left ventricle cavity is normal in size. Normal global wall motion. Visual EF is 55-60%. Doppler evidence of grade I (impaired) diastolic dysfunction. Calculated EF 55%. 2. Left atrial cavity is at upper limits of normal in size. 3. Trace aortic regurgitation. 4. Mild to moderate mitral regurgitation. 5. Mild tricuspid regurgitation. No evidence of pulmonary hypertension. Carotid Doppler06/14/2016 Moderate stenosis in the left external carotid artery (>50%). Severe stenosis in the left internal carotid artery (50-69%). Mixed plaque noted in both carotid arteries. Compared to 09/19/2014, left ICA velocity increased mildly. Follow up in six months is appropriate if clinically indicated. Lower Extremity Doppler06/15/2016 No hemodynamically significant stenoses are identified in the bilateral lower extremity arterial system.This exam reveals mildly decreased perfusion of the right lower extremity, noted at the post tibial artery level with ABI 0.96. This exam reveals normal perfusion of the left lower extremity with ABI 0.99. Study suggests patent right SFA and  left iliac stent. No significant change from 03/26/2014. Mild diffuse mixed plaque noted bilaterally. Triphasic waveform through out the legs. CT Angiogram 08/16/2015 abdomen and pelvis : Large amount of mixed calcified and noncalcified atherosclerotic plaque within a normal caliber abdominal aorta. No dissection. Suspected hemodynamically significant narrowings involving the SMA, the solitary right renal artery as well as the dominant left renal artery. Left common iliac arterial stent widely patent. Suspected hemodynamically significant narrowings involving the origin of the right common and external iliac  arteries as well as the distal aspect of the right common femoral artery. Suspected hemodynamically significant narrowing involving the origin of the left external iliac artery. Labwork 10/18/2014: CBC normal, creatinine 1.1, CMP normal, HbA1c 6.2%, total cholesterol 288, triglycerides 134, HDL 182, LDL 159, vitamin D 18.4 09/28/2014: CRP 18.4 07/27/2013: BUN 13, serum creatinine 0.9, CMP normal. HbA1c 5.9%. Total cholesterol 161, triglycerides 81, HDL 70, LDL 75. Non-HDL cholesterol 91. Vitamin D 34.5. Labs are within normal limits. Labs 10/03/2013: CBC, CMP, PT, PTT normal.  Other Problems [name], AGNP-C; 09/19/2015 8:39 AM) Preoperative cardiovascular examination (Z01.810)    Review of Systems (Melissa Allison AGNP-C; 09/18/2015 2:45 PM) General Not Present- Fatigue, Fever and Weight Gain. Respiratory Not Present- Bloody sputum and Hemoptysis. Cardiovascular Present- Claudications. Not Present- Chest Pain and Fainting. Gastrointestinal Not Present- Abdominal Pain, Black, Tarry Stool, Bloody Stool and Difficulty Swallowing. Musculoskeletal Present- Joint Pain (Right hip pain.). Not Present- Leg Cramps and Leg Weakness. Neurological Not Present- Focal Neurological Symptoms. Hematology Not Present- Abnormal Bleeding, Easy Bleeding and Easy Bruising. All other systems  negative  Vitals [name]; 09/18/2015 12:55 PM) 09/18/2015 12:54 PM Weight: 141.06 lb Height: 64in Body Surface Area: 1.69 m Body Mass Index: 24.21 kg/m  Pulse: 89 (Regular)  P.OX: 97% (Room air) BP: 118/76 (Sitting, Right Arm, Standard)    09/18/2015 12:52 PM Weight: 141.06 lb Height: 64in Body Surface Area: 1.69 m Body Mass Index: 24.21 kg/m  Pulse: 90 (Regular)  P.OX: 96% (Room air) BP: 122/78 (Sitting, Left Arm, Standard)       Physical Exam ([name] AGNP-C; 09/18/2015 2:47 PM) General Mental Status-Alert. General Appearance-Cooperative, Appears stated age, Not in acute distress. Orientation-Oriented X3. Build & Nutrition-Well nourished and Moderately built.  Head and Neck Thyroid Gland Characteristics - no palpable nodules, no palpable enlargement.  Chest and Lung Exam Palpation Tender - No chest wall tenderness. Auscultation Breath sounds - Clear.  Cardiovascular Inspection Jugular vein - Right - No Distention. Auscultation Heart Sounds - S1 WNL, S2 WNL and No gallop present. Murmurs & Other Heart Sounds - Murmur - No murmur.  Abdomen Palpation/Percussion Palpation and Percussion of the abdomen reveal - Non Tender and No hepatosplenomegaly. Auscultation Auscultation of the abdomen reveals - Bowel sounds normal.  Peripheral Vascular Lower Extremity Inspection - Left - No Pigmentation, No Varicose veins. Right - No Pigmentation, No Varicose veins. Palpation - Edema - Left - No edema. Right - No edema. Femoral pulse - Bilateral - 1+ and Bruit. Popliteal pulse - Left - Normal. Right - Normal. Dorsalis pedis pulse - Left - Normal. Right - Normal. Posterior tibial pulse - Left - Normal. Right - Normal. Carotid arteries - Bilateral-Soft Bruit. Abdomen-No prominent abdominal aortic pulsation, No epigastric bruit.  Neurologic Motor-Grossly intact without any focal deficits.  Musculoskeletal Global  Assessment Left Lower Extremity - normal range of motion without pain. Right Lower Extremity - normal range of motion without pain.    Assessment & Plan (Melissa Allison AGNP-C; 09/19/2015 8:40 AM)  Essential hypertension, benign (I10) Impression: EKG 03/26/2015: Sinus rhythm at a rate of 87 bpm, normal axis, normal intervals, no evidence of ischemia. Atherosclerosis of native artery of both lower extremities with intermittent claudication (I70.213) Story: CT angiogram abdomen and pelvis 08/16/2015: Large amount of mixed calcified and noncalcified atherosclerotic plaque within a normal caliber abdominal aorta. No dissection. Suspected hemodynamically significant narrowings involving the SMA, the solitary right renal artery as well as the dominant left renal artery. Left common iliac arterial stent widely patent.  Suspected hemodynamically significant narrowings involving the origin of the right common and external iliac arteries as well as the distal aspect of the right common femoral artery. Suspected hemodynamically significant narrowing involving the origin of the left external iliac artery.  Right SFA stent 2009; LCIA stent 8.0x39 mm stent. Abdominal aortogram 10/10/2013: Moderate atherosclerotic changes of the abdominal aorta, bilateral iliac artery mild to moderate diffuse disease without high-grade stenosis. Left iliac stent widely patent.  Abdominal aortogram 10/10/2013: Mild disease in bilateral iliac arteries constituting 20-30% ostial stenosis. Left common iliac artery stent widely patent. 30-40% stenosis of the right common and external iliac artery with calcification.  Left subclavian arterial duplex 07/05/11: < 50% stenosis of bilateral subclavian arteries, left > right. No change 03/16/11 Normal BP in both arms. Hence clinically not important. Impression: LE arterial duplex 08/15/12: RABI 0.94, LABI 0.92. 1. No hemodynamically significant stenosis is identified on either side. This exam  reveals mildly decreased perfusion of both lower extremities noted at the post tibial artery level. 2. The right SFA and left Common iliac Artery (not visualized) stents are patent hemodynamically. No significant change since June 2012 study. Future Plans 09/26/2015: Complete duplex scan of both carotid arteries [id number]) - one time 09/27/2015: CBC & PLATELETS (AUTO) [id number]) - one time 0000000: METABOLIC PANEL, BASIC (99991111) - one time 09/27/2015: PT (PROTHROMBIN TIME) (29562) - one time Pure hypercholesterolemia (E78.00)   Current Plans  Mechanism of underlying disease process and action of medications discussed with the patient. I discussed primary/secondary prevention and also dietary counseling was done. She presents for follow-up and reevaluation of right hip pain after undergoing CTA that revealed multiple hemodynamically significant narrowings involving the SMA, bilateral renal arteries, right common and external iliac arteries, distal right common femoral artery, and left external iliac artery. Physical exam remains unchanged with pulses present bilaterally and no evidence of limb threatening ischemia, however due to persistent, activity limiting pain and abnormal CTA, will schedule for angiogram for further evaluation. Patient understands the risks, benefits, and alternatives including continued medical therapy. Patient understands <1-2% risk of death, embolic complications, bleeding, infection, renal failure, urgent surgical revascularization, but not limited to these and wants to proceed. Follow up after procedure for reevaluation and further recommendations.  *I have discussed this case with Dr. Einar Gip and he personally examined the patient and participated in formulating the plan.*  Addendum Note(Melissa Allison AGNP-C; 10/01/2015 7:35 PM) 10/01/2015: CBC normal, creatinine 0.80, potassium 4.8, PT/INR normal  Signed by [name], AGNP-C (09/19/2015 8:40 AM)

## 2015-11-12 ENCOUNTER — Encounter (HOSPITAL_COMMUNITY)
Admission: RE | Disposition: A | Discharge status: Home / Self Care | Payer: Medicare Other | Source: Ambulatory Visit | Attending: Cardiology

## 2015-11-12 ENCOUNTER — Encounter (HOSPITAL_COMMUNITY): Payer: Self-pay | Admitting: General Practice

## 2015-11-12 ENCOUNTER — Observation Stay (HOSPITAL_COMMUNITY)
Admission: RE | Admit: 2015-11-12 | Discharge: 2015-11-13 | Disposition: A | Discharge status: Home / Self Care | Payer: Medicare Other | Source: Ambulatory Visit | Attending: Cardiology | Admitting: Cardiology

## 2015-11-12 DIAGNOSIS — Z7982 Long term (current) use of aspirin: Secondary | ICD-10-CM | POA: Diagnosis not present

## 2015-11-12 DIAGNOSIS — E785 Hyperlipidemia, unspecified: Secondary | ICD-10-CM | POA: Diagnosis not present

## 2015-11-12 DIAGNOSIS — I6522 Occlusion and stenosis of left carotid artery: Secondary | ICD-10-CM | POA: Insufficient documentation

## 2015-11-12 DIAGNOSIS — I10 Essential (primary) hypertension: Secondary | ICD-10-CM | POA: Diagnosis not present

## 2015-11-12 DIAGNOSIS — E7800 Pure hypercholesterolemia, unspecified: Secondary | ICD-10-CM | POA: Diagnosis not present

## 2015-11-12 DIAGNOSIS — I739 Peripheral vascular disease, unspecified: Secondary | ICD-10-CM | POA: Diagnosis present

## 2015-11-12 DIAGNOSIS — M316 Other giant cell arteritis: Secondary | ICD-10-CM | POA: Diagnosis present

## 2015-11-12 DIAGNOSIS — I252 Old myocardial infarction: Secondary | ICD-10-CM | POA: Diagnosis not present

## 2015-11-12 DIAGNOSIS — I70213 Atherosclerosis of native arteries of extremities with intermittent claudication, bilateral legs: Secondary | ICD-10-CM | POA: Diagnosis present

## 2015-11-12 DIAGNOSIS — I7092 Chronic total occlusion of artery of the extremities: Secondary | ICD-10-CM | POA: Insufficient documentation

## 2015-11-12 DIAGNOSIS — Z955 Presence of coronary angioplasty implant and graft: Secondary | ICD-10-CM | POA: Insufficient documentation

## 2015-11-12 DIAGNOSIS — I2510 Atherosclerotic heart disease of native coronary artery without angina pectoris: Secondary | ICD-10-CM | POA: Diagnosis not present

## 2015-11-12 HISTORY — DX: Occlusion and stenosis of left carotid artery: I65.22

## 2015-11-12 HISTORY — DX: Rheumatoid arthritis, unspecified: M06.9

## 2015-11-12 HISTORY — DX: Dependence on supplemental oxygen: Z99.81

## 2015-11-12 HISTORY — DX: Personal history of other diseases of the musculoskeletal system and connective tissue: Z87.39

## 2015-11-12 HISTORY — PX: ILIAC ARTERY STENT: SHX1786

## 2015-11-12 HISTORY — DX: Unspecified chronic bronchitis: J42

## 2015-11-12 HISTORY — DX: Syncope and collapse: R55

## 2015-11-12 HISTORY — PX: PERIPHERAL VASCULAR CATHETERIZATION: SHX172C

## 2015-11-12 HISTORY — DX: Anemia, unspecified: D64.9

## 2015-11-12 HISTORY — DX: Peripheral vascular disease, unspecified: I73.9

## 2015-11-12 HISTORY — DX: Gastro-esophageal reflux disease without esophagitis: K21.9

## 2015-11-12 HISTORY — DX: Hypoxemia: R09.02

## 2015-11-12 HISTORY — DX: Pneumonia, unspecified organism: J18.9

## 2015-11-12 HISTORY — DX: Calculus of kidney: N20.0

## 2015-11-12 LAB — POCT ACTIVATED CLOTTING TIME
ACTIVATED CLOTTING TIME: 204 s
ACTIVATED CLOTTING TIME: 162 s
Activated Clotting Time: 224 seconds
Activated Clotting Time: 239 seconds
Activated Clotting Time: 250 seconds

## 2015-11-12 SURGERY — LOWER EXTREMITY ANGIOGRAPHY
Anesthesia: LOCAL

## 2015-11-12 MED ORDER — MIDAZOLAM HCL 2 MG/2ML IJ SOLN
INTRAMUSCULAR | Status: AC
  Filled 2015-11-12: qty 2

## 2015-11-12 MED ORDER — FAMOTIDINE IN NACL 20-0.9 MG/50ML-% IV SOLN
20.0000 mg | Freq: Once | INTRAVENOUS | Status: AC
  Administered 2015-11-12: 20 mg via INTRAVENOUS

## 2015-11-12 MED ORDER — CLOPIDOGREL BISULFATE 75 MG PO TABS
75.0000 mg | ORAL_TABLET | Freq: Every day | ORAL | Status: DC
  Administered 2015-11-13: 75 mg via ORAL
  Filled 2015-11-12 (×2): qty 1

## 2015-11-12 MED ORDER — NITROGLYCERIN 1 MG/10 ML FOR IR/CATH LAB
INTRA_ARTERIAL | Status: AC
  Filled 2015-11-12: qty 10

## 2015-11-12 MED ORDER — LABETALOL HCL 5 MG/ML IV SOLN
INTRAVENOUS | Status: DC | PRN
  Administered 2015-11-12: 15 mg via INTRAVENOUS

## 2015-11-12 MED ORDER — SODIUM BICARBONATE BOLUS VIA INFUSION
INTRAVENOUS | Status: DC
  Filled 2015-11-12: qty 1

## 2015-11-12 MED ORDER — SODIUM CHLORIDE 0.9% FLUSH: 3.0000 mL | INTRAVENOUS | Status: DC | PRN

## 2015-11-12 MED ORDER — SODIUM CHLORIDE 0.9 % IV SOLN
1.0000 mL/kg/h | INTRAVENOUS | Status: AC
  Administered 2015-11-12: 1 mL/kg/h via INTRAVENOUS

## 2015-11-12 MED ORDER — RALOXIFENE HCL 60 MG PO TABS
60.0000 mg | ORAL_TABLET | Freq: Every day | ORAL | Status: DC
  Filled 2015-11-12: qty 1

## 2015-11-12 MED ORDER — HYDROMORPHONE HCL 1 MG/ML IJ SOLN
INTRAMUSCULAR | Status: DC | PRN
  Administered 2015-11-12: 0.5 mg via INTRAVENOUS

## 2015-11-12 MED ORDER — PRAVASTATIN SODIUM 40 MG PO TABS
40.0000 mg | ORAL_TABLET | Freq: Every day | ORAL | Status: DC
  Administered 2015-11-12: 22:00:00 40 mg via ORAL
  Filled 2015-11-12 (×2): qty 1

## 2015-11-12 MED ORDER — METHYLPREDNISOLONE SODIUM SUCC 125 MG IJ SOLR
INTRAMUSCULAR | Status: AC
  Filled 2015-11-12: qty 2

## 2015-11-12 MED ORDER — LIDOCAINE HCL (PF) 1 % IJ SOLN
INTRAMUSCULAR | Status: AC
  Filled 2015-11-12: qty 30

## 2015-11-12 MED ORDER — VITAMIN D 1000 UNITS PO TABS
2000.0000 [IU] | ORAL_TABLET | Freq: Every day | ORAL | Status: DC
  Administered 2015-11-12: 22:00:00 2000 [IU] via ORAL
  Filled 2015-11-12 (×3): qty 2

## 2015-11-12 MED ORDER — VIPERSLIDE LUBRICANT OPTIME
TOPICAL | Status: DC | PRN
  Administered 2015-11-12: 13:00:00 via SURGICAL_CAVITY

## 2015-11-12 MED ORDER — ACETAMINOPHEN 325 MG PO TABS: 650.0000 mg | ORAL_TABLET | ORAL | Status: DC | PRN

## 2015-11-12 MED ORDER — HEPARIN SODIUM (PORCINE) 1000 UNIT/ML IJ SOLN
INTRAMUSCULAR | Status: AC
  Filled 2015-11-12: qty 1

## 2015-11-12 MED ORDER — VITAMIN D (ERGOCALCIFEROL) 1.25 MG (50000 UNIT) PO CAPS: 50000.0000 [IU] | ORAL_CAPSULE | ORAL | Status: DC

## 2015-11-12 MED ORDER — HEPARIN SODIUM (PORCINE) 1000 UNIT/ML IJ SOLN
INTRAMUSCULAR | Status: DC | PRN
  Administered 2015-11-12: 2000 [IU] via INTRAVENOUS
  Administered 2015-11-12: 7000 [IU] via INTRAVENOUS

## 2015-11-12 MED ORDER — IODIXANOL 320 MG/ML IV SOLN
INTRAVENOUS | Status: DC | PRN
  Administered 2015-11-12: 135 mL via INTRA_ARTERIAL

## 2015-11-12 MED ORDER — HEPARIN (PORCINE) IN NACL 2-0.9 UNIT/ML-% IJ SOLN
INTRAMUSCULAR | Status: AC
  Filled 2015-11-12: qty 1000

## 2015-11-12 MED ORDER — HYDRALAZINE HCL 20 MG/ML IJ SOLN
INTRAMUSCULAR | Status: DC | PRN
  Administered 2015-11-12: 10 mg via INTRAVENOUS

## 2015-11-12 MED ORDER — EZETIMIBE 10 MG PO TABS
10.0000 mg | ORAL_TABLET | Freq: Every day | ORAL | Status: DC
  Administered 2015-11-12: 22:00:00 10 mg via ORAL
  Filled 2015-11-12 (×3): qty 1

## 2015-11-12 MED ORDER — VERAPAMIL HCL 2.5 MG/ML IV SOLN
INTRAVENOUS | Status: AC
  Filled 2015-11-12: qty 2

## 2015-11-12 MED ORDER — SODIUM CHLORIDE 0.9 % IV SOLN
INTRAVENOUS | Status: DC
  Administered 2015-11-12: 500 mL via INTRAVENOUS

## 2015-11-12 MED ORDER — NITROGLYCERIN 1 MG/10 ML FOR IR/CATH LAB
INTRA_ARTERIAL | Status: DC | PRN
  Administered 2015-11-12: 200 mL

## 2015-11-12 MED ORDER — CLOPIDOGREL BISULFATE 300 MG PO TABS
ORAL_TABLET | ORAL | Status: DC | PRN
  Administered 2015-11-12: 600 mg via ORAL

## 2015-11-12 MED ORDER — LIDOCAINE HCL (PF) 1 % IJ SOLN
INTRAMUSCULAR | Status: DC | PRN
  Administered 2015-11-12: 16 mL

## 2015-11-12 MED ORDER — BISOPROLOL FUMARATE 5 MG PO TABS
5.0000 mg | ORAL_TABLET | Freq: Every day | ORAL | Status: DC
  Administered 2015-11-12: 2.5 mg via ORAL
  Filled 2015-11-12 (×2): qty 1

## 2015-11-12 MED ORDER — DIPHENHYDRAMINE HCL 50 MG/ML IJ SOLN
25.0000 mg | Freq: Once | INTRAMUSCULAR | Status: AC
  Administered 2015-11-12: 25 mg via INTRAVENOUS

## 2015-11-12 MED ORDER — MIDAZOLAM HCL 2 MG/2ML IJ SOLN
INTRAMUSCULAR | Status: DC | PRN
  Administered 2015-11-12: 2 mg via INTRAVENOUS

## 2015-11-12 MED ORDER — ONDANSETRON HCL 4 MG/2ML IJ SOLN: 4.0000 mg | Freq: Four times a day (QID) | INTRAMUSCULAR | Status: DC | PRN

## 2015-11-12 MED ORDER — ASPIRIN 81 MG PO CHEW
162.0000 mg | CHEWABLE_TABLET | Freq: Every day | ORAL | Status: DC
  Administered 2015-11-12: 162 mg via ORAL
  Filled 2015-11-12: qty 2

## 2015-11-12 MED ORDER — HYDROMORPHONE HCL 1 MG/ML IJ SOLN
INTRAMUSCULAR | Status: AC
  Filled 2015-11-12: qty 1

## 2015-11-12 MED ORDER — LORATADINE 10 MG PO TABS
10.0000 mg | ORAL_TABLET | Freq: Every day | ORAL | Status: DC
  Filled 2015-11-12 (×2): qty 1

## 2015-11-12 MED ORDER — METHYLPREDNISOLONE SODIUM SUCC 125 MG IJ SOLR
125.0000 mg | Freq: Once | INTRAMUSCULAR | Status: AC
  Administered 2015-11-12: 125 mg via INTRAVENOUS

## 2015-11-12 MED ORDER — SODIUM CHLORIDE 0.9% FLUSH: 3.0000 mL | Freq: Two times a day (BID) | INTRAVENOUS | Status: DC

## 2015-11-12 MED ORDER — SODIUM CHLORIDE 0.9 % IV SOLN: 250.0000 mL | INTRAVENOUS | Status: DC | PRN

## 2015-11-12 MED ORDER — NITROGLYCERIN 1 MG/10 ML FOR IR/CATH LAB
INTRA_ARTERIAL | Status: DC | PRN
  Administered 2015-11-12: 2 mL

## 2015-11-12 MED ORDER — CLOPIDOGREL BISULFATE 300 MG PO TABS
ORAL_TABLET | ORAL | Status: AC
  Filled 2015-11-12: qty 2

## 2015-11-12 MED ORDER — FAMOTIDINE IN NACL 20-0.9 MG/50ML-% IV SOLN
INTRAVENOUS | Status: AC
  Filled 2015-11-12: qty 50

## 2015-11-12 MED ORDER — ZOLPIDEM TARTRATE 5 MG PO TABS: 5.0000 mg | ORAL_TABLET | Freq: Every evening | ORAL | Status: DC | PRN

## 2015-11-12 MED ORDER — SODIUM BICARBONATE 8.4 % IV SOLN
INTRAVENOUS | Status: DC
  Filled 2015-11-12: qty 500

## 2015-11-12 MED ORDER — OXYCODONE-ACETAMINOPHEN 5-325 MG PO TABS: 1.0000 | ORAL_TABLET | ORAL | Status: DC | PRN

## 2015-11-12 MED ORDER — DIPHENHYDRAMINE HCL 50 MG/ML IJ SOLN
INTRAMUSCULAR | Status: AC
  Filled 2015-11-12: qty 1

## 2015-11-12 SURGICAL SUPPLY — 20 items
CATH CROSS OVER TEMPO 5F (CATHETERS) ×2 IMPLANT
CATH OMNI FLUSH 5F 65CM (CATHETERS) ×2 IMPLANT
CATH SOFT-VU 4F 65 STRAIGHT (CATHETERS) ×1 IMPLANT
CATH SOFT-VU STRAIGHT 4F 65CM (CATHETERS) ×2
DIAMONDBACK SOLID OAS 2.0MM (CATHETERS) ×2
GLIDEWIRE ADV .035X180CM (WIRE) ×2 IMPLANT
KIT MICROINTRODUCER STIFF 5F (SHEATH) ×2 IMPLANT
KIT PV (KITS) ×2 IMPLANT
SHEATH BRITE TIP 7FR 35CM (SHEATH) ×4 IMPLANT
SHEATH PINNACLE 5F 10CM (SHEATH) ×2 IMPLANT
SHEATH PINNACLE 7F 10CM (SHEATH) ×4 IMPLANT
STENT ABSOLUTE PRO 9X60X135 (Permanent Stent) ×2 IMPLANT
STENT OMNILINK ELITE 9X19X80 (Permanent Stent) ×2 IMPLANT
SYSTEM DIMNDBCK SLD OAS 2.0MM (CATHETERS) ×1 IMPLANT
TAPE RADIOPAQUE TURBO (MISCELLANEOUS) ×2 IMPLANT
TRANSDUCER W/STOPCOCK (MISCELLANEOUS) ×2 IMPLANT
TRAY PV CATH (CUSTOM PROCEDURE TRAY) ×2 IMPLANT
WIRE AMPLATZ SS-J .035X260CM (WIRE) ×2 IMPLANT
WIRE HITORQ VERSACORE ST 145CM (WIRE) ×2 IMPLANT
WIRE VIPER ADVANCE .017X335CM (WIRE) ×2 IMPLANT

## 2015-11-12 NOTE — Progress Notes (Signed)
  Site area: left groin french arterial sheath was removed  Site Prior to Removal:  Level 0  Pressure Applied For 15INUTES    Minutes Beginning at 1555p  Manual:   Yes.    Patient Status During Pull:  stable  Post Pull Groin Site:  Level 0  Post Pull Instructions Given:  Yes.    Post Pull Pulses Present:  Yes.    Dressing Applied:  Yes.    Comments:  VS remain stable during sheath pull.

## 2015-11-12 NOTE — Progress Notes (Signed)
Site area: Right groin a 7 french arterial sheath was removed  Site Prior to Removal:  Level 0  Pressure Applied For 15 MINUTES    Minutes Beginning at 1535p  Manual:   Yes.    Patient Status During Pull:  stable  Post Pull Groin Site:  Level 0  Post Pull Instructions Given:  Yes.    Post Pull Pulses Present:  Yes.    Dressing Applied:  Yes.    Comments:  VS remain stable during sheath pull.

## 2015-11-12 NOTE — Interval H&P Note (Signed)
History and Physical Interval Note:  11/12/2015 11:39 AM  [name]  has presented today for surgery, with the diagnosis of pvd woth claudication  The various methods of treatment have been discussed with the patient and family. After consideration of risks, benefits and other options for treatment, the patient has consented to  Procedure(s): Lower Extremity Angiography (N/A) and possible PTA as a surgical intervention .  The patient's history has been reviewed, patient examined, no change in status, stable for surgery.  I have reviewed the patient's chart and labs.  Questions were answered to the patient's satisfaction.     Adrian Prows

## 2015-11-13 ENCOUNTER — Encounter (HOSPITAL_COMMUNITY): Payer: Self-pay | Admitting: Cardiology

## 2015-11-13 DIAGNOSIS — Z955 Presence of coronary angioplasty implant and graft: Secondary | ICD-10-CM | POA: Diagnosis not present

## 2015-11-13 DIAGNOSIS — I10 Essential (primary) hypertension: Secondary | ICD-10-CM | POA: Diagnosis not present

## 2015-11-13 DIAGNOSIS — I6522 Occlusion and stenosis of left carotid artery: Secondary | ICD-10-CM | POA: Diagnosis not present

## 2015-11-13 DIAGNOSIS — I7092 Chronic total occlusion of artery of the extremities: Secondary | ICD-10-CM | POA: Diagnosis not present

## 2015-11-13 DIAGNOSIS — I70213 Atherosclerosis of native arteries of extremities with intermittent claudication, bilateral legs: Secondary | ICD-10-CM | POA: Diagnosis not present

## 2015-11-13 DIAGNOSIS — E785 Hyperlipidemia, unspecified: Secondary | ICD-10-CM | POA: Diagnosis not present

## 2015-11-13 MED ORDER — CLOPIDOGREL BISULFATE 75 MG PO TABS: 75.0000 mg | ORAL_TABLET | Freq: Every day | ORAL | Status: DC

## 2015-11-13 MED FILL — Heparin Sodium (Porcine) 2 Unit/ML in Sodium Chloride 0.9%: INTRAMUSCULAR | Qty: 1000 | Status: AC

## 2015-11-13 MED FILL — Nitroglycerin IV Soln 100 MCG/ML in D5W: INTRA_ARTERIAL | Qty: 10 | Status: AC

## 2015-11-13 NOTE — Discharge Instructions (Signed)
Angiogram °An angiogram is an X-ray test. It is used to look at your blood vessels. For this test, a dye is put into the blood vessel being checked. The dye shows up on X-rays. It helps your doctor see if there is a blockage or other problem in the blood vessel. °BEFORE THE PROCEDURE °· Follow your doctor's instructions about limiting what you eat or drink. °· Ask your doctor if you may drink enough water to take any needed medicines the morning of the test. °· Plan to have someone take you home after the test. °· If you go home the same day as the test, plan to have someone stay with you for 24 hours. °PROCEDURE  °· An IV tube will be put into one of your veins. °· You will be given a medicine that makes you relax (sedative). °· Your skin will be washed and shaved where the thin tube (catheter) will be inserted. This will usually be done in the upper part of your leg (groin). It may also be done in your arm near the elbow or in your wrist. °· You will be given a medicine that numbs the area where the tube will be inserted (local anesthetic). °· The tube will be inserted into a blood vessel. °· Using a type of X-ray (fluoroscopy) to see, your doctor will move the tube into the blood vessel to check it. °· Dye will be put in through the tube. X-rays of your blood vessels will then be taken. °Different health care providers and hospitals may do this procedure differently. °AFTER THE PROCEDURE  °· If the test is done through the leg, you will be kept in bed lying flat for several hours. You will be told to not bend or cross your legs.   °· The area where the tube was inserted will be checked often.   °· The pulse in your feet or wrist will be checked often.   °· More tests or X-rays may be done.   °  °This information is not intended to replace advice given to you by your health care provider. Make sure you discuss any questions you have with your health care provider. °  °Document Released: 12/25/2008 Document  Revised: 10/19/2014 Document Reviewed: 03/01/2013 °Elsevier Interactive Patient Education ©2016 [employer] ° °Atherectomy, Care After °Refer to this sheet in the next few weeks. These instructions provide you with information about caring for yourself after your procedure. Your health care provider may also give you more specific instructions. Your treatment has been planned according to current medical practices, but problems sometimes occur. Call your health care provider if you have any problems or questions after your procedure. °WHAT TO EXPECT AFTER THE PROCEDURE °After your procedure, it is common to have: °· A tender lump in your groin. °· Bruising. °· Soreness. °HOME CARE INSTRUCTIONS °Incision Care °· There are many ways to close and cover an incision. For example, an incision can be closed with stitches (sutures), skin glue, or adhesive strips. Follow instructions from your health care provider about: °¨ How to take care of your incision. °¨ When and how you should change your bandage (dressing). °¨ When you should remove your dressing. °¨ Removing whatever was used to close your incision. °· Check your incision area every day for signs of infection. Watch for: °¨ Redness, swelling, or pain. °¨ Fluid, blood, or pus. °· Do not take baths, swim, or use a hot tub until your health care provider approves. Ask your health care provider if   you can take showers. °Activity °· Return to your normal activities as told by your health care provider. Ask your health care provider what activities are safe for you. °· Do not lift anything that is heavier than 10 lb (4.5 kg). °General Instructions °· Take over-the-counter and prescription medicines only as told by your health care provider. °· Drink enough fluid to keep your urine clear or pale yellow. °· Keep all follow-up visits as told by your health care provider. This is important. °SEEK MEDICAL CARE IF: °· You have a fever or chills. °· You have redness,  swelling, or pain at your incision site. °· You have fluid, blood, or pus coming from your incision site. °SEEK IMMEDIATE MEDICAL CARE IF: °· You have uncontrolled bleeding at your incision site. °· You have chest pain. °· You have trouble breathing. °  °This information is not intended to replace advice given to you by your health care provider. Make sure you discuss any questions you have with your health care provider. °  °Document Released: 02/12/2015 Document Reviewed: 02/12/2015 °Elsevier Interactive Patient Education ©2016 [employer] ° °

## 2015-11-13 NOTE — Discharge Summary (Signed)
Physician Discharge Summary  Patient ID: [name] MRN: [id number] DOB/AGE: 1941-08-03 75 y.o.  Admit date: 11/12/2015 Discharge date: 11/13/2015  Primary Discharge Diagnosis: PAD s/p stenting of the right common iliac artery and left external iliac artery  Secondary Discharge Diagnosis: 1. Hyperlipidemia 2. Benign essential hypertension 3. Asymptomatic carotid artery stenosis, left  Significant Diagnostic Studies: PV angiogram 11/13/2015: Abdominal aortogram: 2 renal arteries one on either sides, they're widely patent. Abdominal aorta shows moderate diffuse calcification and distal abdominal aorta shows severe atherosclerotic/calcific changes.   Right common iliac artery ostium had a high-grade calcific stenosis. There was a 40-50 mmHg pressure gradient across the right common iliac artery stenosis. Right femoral artery was widely patent.  Left common iliac artery stent widely patent. Left external iliac artery shows heavily calcified 80-85% stenosis, with a 50 mmHg pressure gradient across the stenosis. Eccentric calcium was also present. Left femoral artery although shows calcification there was no high-grade stenosis.  Bilateral internal iliac arteries are occluded chronically.   Hospital Course:  [name] is a 75 year old female with a history of known coronary artery disease and myocardial infarction in 1999. She also has had another MI in 2001. She has history of right coronary artery stent placed in 1999. She has left carotid endarterectomy in 2000, and she has known left carotid artery stenosis. She also has history of left iliac artery stenting and right SFA stenting and left subclavian artery stenting in the remote past. Other significant past medical history includes hyperglycemia and hypercholesterolemia.  She had been seen in June for right hip pain and underwent lower extremity duplex which revealed normal perfusion of the lower extremities with triphasic waveforms.  She had a hip x-ray to evaluate for orthopedic etiology, particularly in light of recent prednisone use, however this was normal. Her PCP then ordered a CTA of the abdomen which revealed "suspected hemodynamically significant narrowings involving the SMA, the solitary right renal artery as well as the dominant left renal artery. Left common iliac arterial stent widely patent. Suspected hemodynamically significant narrowings involving the origin of the right common and external iliac arteries as well as the distal aspect of the right common femoral artery. Suspected hemodynamically significant narrowing involving the origin of the left external iliac artery." Due to persistent, activity limiting pain and abnormal CTA, she was scheduled for angiogram for further evaluation which revealed right common iliac artery ostial high-grade calcific stenosis and left external iliac artery heavily calcified 80-85% stenosis, with a 50 mmHg pressure gradient across the stenosis. She underwent successful direct stenting of the right common iliac artery with implantation of a 9.0 x 19 mm Omnilink Elite balloon-expandable stent, 80% stenosis to 0% and successful atherectomy of the left external iliac artery with a 2 mm solid crown CSI device followed by self-expanding stent, 9.0 x 60 mm absolute Pro stent followed by balloon angioplasty with the previously used stent balloon at a peak of 10 atmospheric pressure. Stenosis reduced from 80% to less than 10% with 0 mmHg pressure gradient.  Recommendations on discharge: Patient will obtain an ABI in the outpatient basis in the next 1-2 weeks, we'll continue to evaluate her clinically. She'll be started on Plavix along with continued 81 mg aspirin.  Discharge Exam: Blood pressure 111/51, pulse 94, temperature 97.7 F (36.5 C), temperature source Oral, resp. rate 13, height 5\' 3"  (1.6 m), weight 65 kg (143 lb 4.8 oz), SpO2 98 %.    General appearance: alert, cooperative, appears  stated age and  no distress Neck: bilateral soft carotid bruit Resp: clear to auscultation bilaterally Chest wall: no tenderness Cardio: regular rate and rhythm, S1, S2 normal, no murmur, click, rub or gallop Extremities: extremities normal, atraumatic, no cyanosis or edema Pulses: 2+ and symmetric soft bilateral femoral bruits, bilateral femoral access sites asymptomatic Skin: Skin color, texture, turgor normal. No rashes or lesions  Labs:   Lab Results  Component Value Date   WBC 6.9 05/16/2008   HGB 12.6 05/16/2008   HCT 37.1 05/16/2008   MCV 91.3 05/16/2008   PLT 327 05/16/2008   No results for input(s): NA, K, CL, CO2, BUN, CREATININE, CALCIUM, PROT, BILITOT, ALKPHOS, ALT, AST, GLUCOSE in the last 168 hours.  Invalid input(s): LABALBU  Lipid Panel  No results found for: CHOL, TRIG, HDL, CHOLHDL, VLDL, LDLCALC  BNP (last 3 results) No results for input(s): BNP in the last 8760 hours.  HEMOGLOBIN A1C No results found for: HGBA1C, MPG  Cardiac Panel (last 3 results) No results for input(s): CKTOTAL, CKMB, TROPONINI, RELINDX in the last 8760 hours.  No results found for: CKTOTAL, CKMB, CKMBINDEX, TROPONINI   TSH No results for input(s): TSH in the last 8760 hours.  Radiology: No results found.    FOLLOW UP PLANS AND APPOINTMENTS    Medication List    TAKE these medications        aspirin 81 MG tablet  Take 162 mg by mouth daily.     calcium carbonate 500 MG chewable tablet  Commonly known as:  TUMS - dosed in mg elemental calcium  Chew 1 tablet by mouth as needed for indigestion or heartburn.     cetirizine 10 MG tablet  Commonly known as:  ZYRTEC  Take 10 mg by mouth daily as needed for allergies.     clopidogrel 75 MG tablet  Commonly known as:  PLAVIX  Take 1 tablet (75 mg total) by mouth daily with breakfast.     EVISTA 60 MG tablet  Generic drug:  raloxifene  Take 60 mg by mouth daily.     fluvastatin XL 80 MG 24 hr tablet  Commonly known  as:  LESCOL XL  Take 80 mg by mouth daily.     Vitamin D (Ergocalciferol) 50000 units Caps capsule  Commonly known as:  DRISDOL  Take 50,000 Units by mouth every 7 (seven) days.     Vitamin D 2000 units Caps  Take 2,000 Units by mouth daily.     ZEBETA 5 MG tablet  Generic drug:  bisoprolol  Take 2.5 mg by mouth daily.     ZETIA 10 MG tablet  Generic drug:  ezetimibe  Take 10 mg by mouth daily.           Follow-up Information    Follow up with Adrian Prows, MD.   Specialty:  Cardiology   Why:  Keep previous appointment   Contact information:   [place] [place] [place] [address] [phone]        Rachel Bo, NP-C 11/13/2015, 7:59 AM [facility] Pager: [phone] Office: [phone]

## 2015-11-27 DIAGNOSIS — I739 Peripheral vascular disease, unspecified: Secondary | ICD-10-CM | POA: Diagnosis not present

## 2015-11-29 DIAGNOSIS — I10 Essential (primary) hypertension: Secondary | ICD-10-CM | POA: Diagnosis not present

## 2015-11-29 DIAGNOSIS — I70213 Atherosclerosis of native arteries of extremities with intermittent claudication, bilateral legs: Secondary | ICD-10-CM | POA: Diagnosis not present

## 2015-11-29 DIAGNOSIS — E7800 Pure hypercholesterolemia, unspecified: Secondary | ICD-10-CM | POA: Diagnosis not present

## 2015-12-02 DIAGNOSIS — I10 Essential (primary) hypertension: Secondary | ICD-10-CM | POA: Diagnosis not present

## 2015-12-02 DIAGNOSIS — M8580 Other specified disorders of bone density and structure, unspecified site: Secondary | ICD-10-CM | POA: Diagnosis not present

## 2015-12-02 DIAGNOSIS — E559 Vitamin D deficiency, unspecified: Secondary | ICD-10-CM | POA: Diagnosis not present

## 2015-12-02 DIAGNOSIS — R739 Hyperglycemia, unspecified: Secondary | ICD-10-CM | POA: Diagnosis not present

## 2015-12-02 DIAGNOSIS — N390 Urinary tract infection, site not specified: Secondary | ICD-10-CM | POA: Diagnosis not present

## 2015-12-05 DIAGNOSIS — M791 Myalgia: Secondary | ICD-10-CM | POA: Diagnosis not present

## 2015-12-05 DIAGNOSIS — I2510 Atherosclerotic heart disease of native coronary artery without angina pectoris: Secondary | ICD-10-CM | POA: Diagnosis not present

## 2015-12-05 DIAGNOSIS — I739 Peripheral vascular disease, unspecified: Secondary | ICD-10-CM | POA: Diagnosis not present

## 2015-12-09 DIAGNOSIS — Z961 Presence of intraocular lens: Secondary | ICD-10-CM | POA: Diagnosis not present

## 2015-12-09 DIAGNOSIS — E7800 Pure hypercholesterolemia, unspecified: Secondary | ICD-10-CM | POA: Diagnosis not present

## 2015-12-09 DIAGNOSIS — R05 Cough: Secondary | ICD-10-CM | POA: Diagnosis not present

## 2015-12-09 DIAGNOSIS — R700 Elevated erythrocyte sedimentation rate: Secondary | ICD-10-CM | POA: Diagnosis not present

## 2015-12-09 DIAGNOSIS — H524 Presbyopia: Secondary | ICD-10-CM | POA: Diagnosis not present

## 2015-12-09 DIAGNOSIS — H26492 Other secondary cataract, left eye: Secondary | ICD-10-CM | POA: Diagnosis not present

## 2015-12-09 DIAGNOSIS — H43813 Vitreous degeneration, bilateral: Secondary | ICD-10-CM | POA: Diagnosis not present

## 2015-12-09 DIAGNOSIS — R0789 Other chest pain: Secondary | ICD-10-CM | POA: Diagnosis not present

## 2015-12-09 DIAGNOSIS — I10 Essential (primary) hypertension: Secondary | ICD-10-CM | POA: Diagnosis not present

## 2015-12-27 DIAGNOSIS — R700 Elevated erythrocyte sedimentation rate: Secondary | ICD-10-CM | POA: Diagnosis not present

## 2016-01-06 DIAGNOSIS — K1121 Acute sialoadenitis: Secondary | ICD-10-CM | POA: Diagnosis not present

## 2016-01-13 DIAGNOSIS — R700 Elevated erythrocyte sedimentation rate: Secondary | ICD-10-CM | POA: Diagnosis not present

## 2016-01-13 DIAGNOSIS — R51 Headache: Secondary | ICD-10-CM | POA: Diagnosis not present

## 2016-01-13 DIAGNOSIS — M316 Other giant cell arteritis: Secondary | ICD-10-CM | POA: Diagnosis not present

## 2016-01-13 DIAGNOSIS — H538 Other visual disturbances: Secondary | ICD-10-CM | POA: Diagnosis not present

## 2016-01-29 DIAGNOSIS — I2510 Atherosclerotic heart disease of native coronary artery without angina pectoris: Secondary | ICD-10-CM | POA: Diagnosis not present

## 2016-02-03 DIAGNOSIS — M316 Other giant cell arteritis: Secondary | ICD-10-CM | POA: Diagnosis not present

## 2016-02-03 DIAGNOSIS — E7800 Pure hypercholesterolemia, unspecified: Secondary | ICD-10-CM | POA: Diagnosis not present

## 2016-02-03 DIAGNOSIS — R700 Elevated erythrocyte sedimentation rate: Secondary | ICD-10-CM | POA: Diagnosis not present

## 2016-02-03 DIAGNOSIS — I739 Peripheral vascular disease, unspecified: Secondary | ICD-10-CM | POA: Diagnosis not present

## 2016-03-12 DIAGNOSIS — N390 Urinary tract infection, site not specified: Secondary | ICD-10-CM | POA: Diagnosis not present

## 2016-03-12 DIAGNOSIS — R300 Dysuria: Secondary | ICD-10-CM | POA: Diagnosis not present

## 2016-03-18 DIAGNOSIS — M316 Other giant cell arteritis: Secondary | ICD-10-CM | POA: Diagnosis not present

## 2016-03-18 DIAGNOSIS — R700 Elevated erythrocyte sedimentation rate: Secondary | ICD-10-CM | POA: Diagnosis not present

## 2016-03-18 DIAGNOSIS — N3001 Acute cystitis with hematuria: Secondary | ICD-10-CM | POA: Diagnosis not present

## 2016-03-18 DIAGNOSIS — R51 Headache: Secondary | ICD-10-CM | POA: Diagnosis not present

## 2016-03-18 DIAGNOSIS — H538 Other visual disturbances: Secondary | ICD-10-CM | POA: Diagnosis not present

## 2016-03-26 DIAGNOSIS — I6523 Occlusion and stenosis of bilateral carotid arteries: Secondary | ICD-10-CM | POA: Diagnosis not present

## 2016-03-26 DIAGNOSIS — I739 Peripheral vascular disease, unspecified: Secondary | ICD-10-CM | POA: Diagnosis not present

## 2016-05-11 DIAGNOSIS — R197 Diarrhea, unspecified: Secondary | ICD-10-CM | POA: Diagnosis not present

## 2016-05-12 DIAGNOSIS — R197 Diarrhea, unspecified: Secondary | ICD-10-CM | POA: Diagnosis not present

## 2016-05-18 DIAGNOSIS — H538 Other visual disturbances: Secondary | ICD-10-CM | POA: Diagnosis not present

## 2016-05-18 DIAGNOSIS — R51 Headache: Secondary | ICD-10-CM | POA: Diagnosis not present

## 2016-05-18 DIAGNOSIS — R700 Elevated erythrocyte sedimentation rate: Secondary | ICD-10-CM | POA: Diagnosis not present

## 2016-05-18 DIAGNOSIS — M316 Other giant cell arteritis: Secondary | ICD-10-CM | POA: Diagnosis not present

## 2016-05-25 ENCOUNTER — Other Ambulatory Visit: Payer: Self-pay | Admitting: Internal Medicine

## 2016-05-25 DIAGNOSIS — Z1231 Encounter for screening mammogram for malignant neoplasm of breast: Secondary | ICD-10-CM

## 2016-06-04 DIAGNOSIS — I6522 Occlusion and stenosis of left carotid artery: Secondary | ICD-10-CM | POA: Diagnosis not present

## 2016-06-04 DIAGNOSIS — I2510 Atherosclerotic heart disease of native coronary artery without angina pectoris: Secondary | ICD-10-CM | POA: Diagnosis not present

## 2016-06-04 DIAGNOSIS — I10 Essential (primary) hypertension: Secondary | ICD-10-CM | POA: Diagnosis not present

## 2016-06-04 DIAGNOSIS — I70213 Atherosclerosis of native arteries of extremities with intermittent claudication, bilateral legs: Secondary | ICD-10-CM | POA: Diagnosis not present

## 2016-06-05 ENCOUNTER — Other Ambulatory Visit: Payer: Self-pay

## 2016-06-30 ENCOUNTER — Ambulatory Visit
Admission: RE | Admit: 2016-06-30 | Discharge: 2016-06-30 | Disposition: A | Discharge status: Home / Self Care | Payer: Medicare Other | Source: Ambulatory Visit | Attending: Internal Medicine | Admitting: Internal Medicine

## 2016-06-30 DIAGNOSIS — Z1231 Encounter for screening mammogram for malignant neoplasm of breast: Secondary | ICD-10-CM

## 2016-08-03 DIAGNOSIS — M316 Other giant cell arteritis: Secondary | ICD-10-CM | POA: Diagnosis not present

## 2016-08-03 DIAGNOSIS — E7800 Pure hypercholesterolemia, unspecified: Secondary | ICD-10-CM | POA: Diagnosis not present

## 2016-08-26 DIAGNOSIS — R700 Elevated erythrocyte sedimentation rate: Secondary | ICD-10-CM | POA: Diagnosis not present

## 2016-08-26 DIAGNOSIS — M1990 Unspecified osteoarthritis, unspecified site: Secondary | ICD-10-CM | POA: Diagnosis not present

## 2016-08-26 DIAGNOSIS — M316 Other giant cell arteritis: Secondary | ICD-10-CM | POA: Diagnosis not present

## 2016-08-26 DIAGNOSIS — Z23 Encounter for immunization: Secondary | ICD-10-CM | POA: Diagnosis not present

## 2016-08-26 DIAGNOSIS — H538 Other visual disturbances: Secondary | ICD-10-CM | POA: Diagnosis not present

## 2016-09-08 DIAGNOSIS — C44519 Basal cell carcinoma of skin of other part of trunk: Secondary | ICD-10-CM | POA: Diagnosis not present

## 2016-09-08 DIAGNOSIS — L02426 Furuncle of left lower limb: Secondary | ICD-10-CM | POA: Diagnosis not present

## 2016-09-08 DIAGNOSIS — L739 Follicular disorder, unspecified: Secondary | ICD-10-CM | POA: Diagnosis not present

## 2016-09-08 DIAGNOSIS — B9689 Other specified bacterial agents as the cause of diseases classified elsewhere: Secondary | ICD-10-CM | POA: Diagnosis not present

## 2016-09-08 DIAGNOSIS — L308 Other specified dermatitis: Secondary | ICD-10-CM | POA: Diagnosis not present

## 2016-12-10 DIAGNOSIS — I2510 Atherosclerotic heart disease of native coronary artery without angina pectoris: Secondary | ICD-10-CM | POA: Diagnosis not present

## 2016-12-10 DIAGNOSIS — I70213 Atherosclerosis of native arteries of extremities with intermittent claudication, bilateral legs: Secondary | ICD-10-CM | POA: Diagnosis not present

## 2016-12-10 DIAGNOSIS — I6522 Occlusion and stenosis of left carotid artery: Secondary | ICD-10-CM | POA: Diagnosis not present

## 2016-12-10 DIAGNOSIS — Z006 Encounter for examination for normal comparison and control in clinical research program: Secondary | ICD-10-CM | POA: Diagnosis not present

## 2016-12-11 DIAGNOSIS — Z961 Presence of intraocular lens: Secondary | ICD-10-CM | POA: Diagnosis not present

## 2016-12-11 DIAGNOSIS — H524 Presbyopia: Secondary | ICD-10-CM | POA: Diagnosis not present

## 2016-12-11 DIAGNOSIS — H43813 Vitreous degeneration, bilateral: Secondary | ICD-10-CM | POA: Diagnosis not present

## 2016-12-11 DIAGNOSIS — H26492 Other secondary cataract, left eye: Secondary | ICD-10-CM | POA: Diagnosis not present

## 2016-12-30 DIAGNOSIS — I70213 Atherosclerosis of native arteries of extremities with intermittent claudication, bilateral legs: Secondary | ICD-10-CM | POA: Diagnosis not present

## 2017-01-12 DIAGNOSIS — J209 Acute bronchitis, unspecified: Secondary | ICD-10-CM | POA: Diagnosis not present

## 2017-01-12 DIAGNOSIS — R300 Dysuria: Secondary | ICD-10-CM | POA: Diagnosis not present

## 2017-02-04 DIAGNOSIS — I10 Essential (primary) hypertension: Secondary | ICD-10-CM | POA: Diagnosis not present

## 2017-02-04 DIAGNOSIS — E7800 Pure hypercholesterolemia, unspecified: Secondary | ICD-10-CM | POA: Diagnosis not present

## 2017-02-04 DIAGNOSIS — N390 Urinary tract infection, site not specified: Secondary | ICD-10-CM | POA: Diagnosis not present

## 2017-02-04 DIAGNOSIS — E559 Vitamin D deficiency, unspecified: Secondary | ICD-10-CM | POA: Diagnosis not present

## 2017-02-04 DIAGNOSIS — M810 Age-related osteoporosis without current pathological fracture: Secondary | ICD-10-CM | POA: Diagnosis not present

## 2017-02-05 DIAGNOSIS — M810 Age-related osteoporosis without current pathological fracture: Secondary | ICD-10-CM | POA: Diagnosis not present

## 2017-02-05 DIAGNOSIS — Z23 Encounter for immunization: Secondary | ICD-10-CM | POA: Diagnosis not present

## 2017-02-05 DIAGNOSIS — Z0000 Encounter for general adult medical examination without abnormal findings: Secondary | ICD-10-CM | POA: Diagnosis not present

## 2017-02-11 DIAGNOSIS — I739 Peripheral vascular disease, unspecified: Secondary | ICD-10-CM | POA: Diagnosis not present

## 2017-02-11 DIAGNOSIS — E7800 Pure hypercholesterolemia, unspecified: Secondary | ICD-10-CM | POA: Diagnosis not present

## 2017-02-11 DIAGNOSIS — M810 Age-related osteoporosis without current pathological fracture: Secondary | ICD-10-CM | POA: Diagnosis not present

## 2017-02-11 DIAGNOSIS — I10 Essential (primary) hypertension: Secondary | ICD-10-CM | POA: Diagnosis not present

## 2017-02-25 DIAGNOSIS — M8589 Other specified disorders of bone density and structure, multiple sites: Secondary | ICD-10-CM | POA: Diagnosis not present

## 2017-02-25 DIAGNOSIS — M316 Other giant cell arteritis: Secondary | ICD-10-CM | POA: Diagnosis not present

## 2017-02-25 DIAGNOSIS — M1990 Unspecified osteoarthritis, unspecified site: Secondary | ICD-10-CM | POA: Diagnosis not present

## 2017-02-25 DIAGNOSIS — R700 Elevated erythrocyte sedimentation rate: Secondary | ICD-10-CM | POA: Diagnosis not present

## 2017-03-16 DIAGNOSIS — Z006 Encounter for examination for normal comparison and control in clinical research program: Secondary | ICD-10-CM | POA: Diagnosis not present

## 2017-03-16 DIAGNOSIS — I70213 Atherosclerosis of native arteries of extremities with intermittent claudication, bilateral legs: Secondary | ICD-10-CM | POA: Diagnosis not present

## 2017-03-16 DIAGNOSIS — I2510 Atherosclerotic heart disease of native coronary artery without angina pectoris: Secondary | ICD-10-CM | POA: Diagnosis not present

## 2017-03-16 DIAGNOSIS — I10 Essential (primary) hypertension: Secondary | ICD-10-CM | POA: Diagnosis not present

## 2017-03-25 DIAGNOSIS — I771 Stricture of artery: Secondary | ICD-10-CM | POA: Diagnosis not present

## 2017-04-07 DIAGNOSIS — I70213 Atherosclerosis of native arteries of extremities with intermittent claudication, bilateral legs: Secondary | ICD-10-CM | POA: Diagnosis not present

## 2017-04-11 NOTE — H&P (Signed)
OFFICE VISIT NOTES COPIED TO EPIC FOR DOCUMENTATION  . History of Present Illness Melissa Page MD; 28-Mar-2017 6:04 PM) Patient words: Last OV 12/10/2016; FU for pain in right leg.  The patient is a 76 year old female who presents for a follow-up for Peripheral vascular disease. She has a history of known coronary artery disease and myocardial infarction in 1999 along with RCA stent placement. She also has had another MI in 2001. She has left carotid endarterectomy in 2000, left iliac artery stenting and right SFA stenting and left subclavian artery stenting in the remote past and repeat stenting of bilateral external iliac arteries on 11/12/2015 with balloon expandable stent on the right and self-expanding stent on the left.  Other significant past medical history includes hyperglycemia and hypercholesterolemia and unable to tolerate high dose statins and is presently enrolled in Esperion trial with Bempedoic acid.  She has noticed worsening symptoms of claudication especially right leg and thinks that she needs repeat procedure and made an appointment to see me today. She has chronic mild dyspnea, but denies any recent exacerbation of worsening. She has not had any angina pectoris and has not used any sublingual nitroglycerin.  Additional reasons for visit:  Follow-up for Coronary artery disease    Problem List/Past Medical (April Harrington; 2017-03-28 4:06 PM) Other symptoms involving cardiovascular system (785.9)  History of esophageal stricture (Z87.19)  Follows Dr. Earlie Raveling. Last eophageal dilatation 06/2014 Temporal arteritis (M31.6) [08/12/2014]: Atherosclerosis of native artery of both lower extremities with intermittent claudication (I70.213)  Abdominal aortic duplex 12/30/2016: Diffuse plaque noted in the proximal, mid and distal aorta. Normal iliac velocity. No AAA observed. Lower extremity arterial duplex 03/26/2016: No hemodynamically significant stenoses are identified  in the bilateral lower extremity arterial system. This exam reveals mildly decreased perfusion of the right lower extremity, with ABI 1.02 and left lower extremity with ABI 0.99. Study suggests patent right SFA and left iliac stent. No significant change compared to 03/27/2015. Asymptomatic stenosis of left carotid artery (I65.22)  Left Carotid Endarectomy in 2000. Carotid artery duplex 03/26/2016: Doppler flow velocities in the left carotid bifurcation are consistent with stenosis in the range of >50% with moderate heterogeneous plaque. Left vertebral artery waveform is biphasic. The left subclavian velocity is normal and triphasic. Antegrade right vertebral artery flow. Antegrade left vertebral artery flow with hesitation. Follow up in one year is appropriate if clinically indicated. No significant change compared to 09/26/15. Pure hypercholesterolemia (E78.00)  Postsurgical percutaneous transluminal coronary angioplasty status (Z98.61)  Essential hypertension, benign (I10)  H/O carotid endarterectomy (J82.505)  Left Carotid Endarectomy in 2000. Atherosclerosis of native coronary artery of native heart without angina pectoris (I25.10)  MI in 1999; Stent in right coronary artery in 1999; MI in 2001; Patent stent at that time. Nuclear Stress: 10/10/10 Positive EKG and normal nuclear perfusion images, normal EF. 4.9 METS. Hyperglycemia (R73.9)  Sinus tachycardia by electrocardiogram (R00.0)  Claudication (I73.9)  Subclavian artery stenosis, left (I77.1) [09/28/1999]: Left subclavian artery stenting in 2000  Allergies (April Harrington; 03-28-2017 4:06 PM) Sulfa 10 *OPHTHALMIC AGENTS*  Anaphylaxis. Cipro *FLUOROQUINOLONES*  Rash. Phenergan *ANTIHISTAMINES*  Rash. Cilostazol *HEMATOLOGICAL AGENTS - MISC.*  Cough. Niaspan *ANTIHYPERLIPIDEMICS*  Resulting in Unknown Lipitor *ANTIHYPERLIPIDEMICS*  Resulting in Unknown Zocor *ANTIHYPERLIPIDEMICS*  Resulting in Unknown Pravachol  *ANTIHYPERLIPIDEMICS*  Resulting in Unknown NSAID  Resulting in Unknown IVP Dye  Resulting in Unknown  Family History (April Harrington; March 28, 2017 4:06 PM) Father  Deceased. at age 41 from an MI Brother 1  Deceased. at  age 40 from Meningitis Mother  Deceased. at age 63 from complications from DM Sister 1  living; H/O Breast Cancer  Social History (April Harrington; 03/16/2017 4:06 PM) Current tobacco use  Former smoker. quit in 2001 Non Drinker/No Alcohol Use  Marital status  Widowed. Living Situation  Son lives with her Number of Children  3.  Past Surgical History (April Harrington; 03/16/2017 4:06 PM) MI in 1999; Stent in right coronary artery in 1999;  Stent in left subclavian artery in 2000, Possible same time Right SFA stent. ; MI in 2001; Patent stent and otherwise normal.  Left Carotid Endarectomy in 2000.  2009 LCIA stent 8.0x39 . Severe atheresclerotic changes of abdominal aorta. Patent right SFA stent, 3 vessel runoff below both knee.   Medication History (April Harrington; 03/16/2017 4:15 PM) PreserVision AREDS (1 Oral daily) Active. Clopidogrel Bisulfate (75MG  Tablet, 1 Tablet Oral daily, Taken starting 02/03/2017) Active. Bisoprolol Fumarate (5MG  Tablet, 1 (one) Tablet Tablet Tablet Oral at bedtime, Taken starting 11/29/2015) Active. Aspirin (81MG  Tablet, 2 Oral morning) Active. Vitamin D3 (2000UNIT Capsule, 1 Oral daily) Active. Vitamin D (50000U Capsule, 1 Oral weekly) Active. PredniSONE (5MG  Tablet, 1 Oral daily) Active. CoQ10 (200MG  Capsule, 1 Oral daily) Active. Cholesterol Research Trial Tablet (1 daily) Active. Pantoprazole Sodium (40MG  Tablet DR, 1 Oral daily) Active. Benzonatate (100MG  Capsule, 1 Oral as needed) Active. Medications Reconciled (verbally with pt; no list or medication present)  Diagnostic Studies History Anderson Malta Sergeant; 03/16/2017 9:28 AM) Abdominal Ultrasound [12/30/2016]: Diffuse plaque noted in the proximal, mid  and distal aorta. Normal iliac velocity. No AAA observed.  Other Problems (April Harrington; 03/16/2017 4:06 PM) Preoperative cardiovascular examination (Z01.810)  Unspecified Diagnosis  Patient in clinical research study (Z00.6) [03/04/2016]: Patient enrollement in the Statin Intolerant Trial with Bempedoic acid "Esperion". Do not check Lipids please    Review of Systems Melissa Page MD; 03/16/2017 6:04 PM) General Not Present- Fatigue, Fever and Weight Gain. Respiratory Not Present- Bloody sputum and Hemoptysis. Cardiovascular Present- Claudications (right thigh and legs). Not Present- Chest Pain and Fainting. Gastrointestinal Not Present- Abdominal Pain, Black, Tarry Stool, Bloody Stool and Difficulty Swallowing. Musculoskeletal Not Present- Backache, Joint Pain, Leg Cramps and Leg Weakness. Neurological Not Present- Focal Neurological Symptoms. Hematology Not Present- Abnormal Bleeding, Easy Bleeding and Easy Bruising. All other systems negative  Vitals (April Harrington; 03/16/2017 4:18 PM) 03/16/2017 4:17 PM BP: 120/62 (Sitting, Right Arm, Standard)    03/16/2017 4:08 PM Weight: 159.06 lb Height: 64in Body Surface Area: 1.77 m Body Mass Index: 27.3 kg/m  Pulse: 81 (Regular)  P.OX: 95% (Room air) BP: 130/90 (Sitting, Left Arm, Standard)       Physical Exam Melissa Page MD; 03/16/2017 6:05 PM) General Mental Status-Alert. General Appearance-Cooperative, Appears stated age, Not in acute distress. Orientation-Oriented X3. Build & Nutrition-Well nourished and Moderately built.  Head and Neck Thyroid Gland Characteristics - no palpable nodules, no palpable enlargement.  Chest and Lung Exam Palpation Tender - No chest wall tenderness. Auscultation Breath sounds - Clear.  Cardiovascular Cardiovascular examination reveals -normal heart sounds, regular rate and rhythm with no murmurs.  Abdomen Palpation/Percussion Normal exam - Non  Tender and No hepatosplenomegaly. Auscultation Normal exam - Bowel sounds normal.  Peripheral Vascular Lower Extremity Inspection - Left - No Pigmentation, No Varicose veins. Right - No Pigmentation, No Varicose veins. Palpation - Edema - Bilateral - No edema. Femoral pulse - Bilateral - 1+(soft bruit right femoral). Popliteal pulse - Bilateral - Normal. Dorsalis pedis pulse - Bilateral - 2+.  Posterior tibial pulse - Bilateral - Normal. Carotid arteries - Bilateral-Soft Bruit. Abdomen-No prominent abdominal aortic pulsation, No epigastric bruit.  Neurologic Motor-Grossly intact without any focal deficits.  Musculoskeletal Global Assessment Left Lower Extremity - normal range of motion without pain. Right Lower Extremity - normal range of motion without pain.    Assessment & Plan Melissa Page MD; 03/16/2017 6:07 PM) Atherosclerosis of native coronary artery of native heart without angina pectoris (I25.10) Story: MI in 1999; Stent in right coronary artery in 1999; MI in 2001; Patent stent at that time. Nuclear Stress: 10/10/10 Positive EKG and normal nuclear perfusion images, normal EF. 4.9 METS. Impression: EKG 12/10/2016: Sinus rhythm with short PR interval at rate of 76 bpm, normal axis, no evidence of ischemia, otherwise normal EKG. No significant change from EKG 06/04/2016. Essential hypertension, benign (I10) Impression: EKG 03/26/2015: Sinus rhythm at a rate of 87 bpm, normal axis, normal intervals, no evidence of ischemia. Atherosclerosis of native artery of both lower extremities with intermittent claudication (I70.213) Story: Abdominal aortic duplex 12/30/2016: Diffuse plaque noted in the proximal, mid and distal aorta. Normal iliac velocity. No AAA observed.  Lower extremity arterial duplex 03/26/2016: No hemodynamically significant stenoses are identified in the bilateral lower extremity arterial system. This exam reveals mildly decreased perfusion of the right lower  extremity, with ABI 1.02 and left lower extremity with ABI 0.99. Study suggests patent right SFA and left iliac stent. No significant change compared to 03/27/2015. Impression: Peripheral arteriogram 11/12/2015: RCIA 80% to 0% with 9x19 mm Omnilink Elite balloon expandable stent. Left External iliac 90% to 0% with 27mm CSI atherectomy followed by 9x60 mm self expanding Absolute Pro stent.  Right SFA stent 2009; LCIA stent 8.0x39 mm stent. Left subclavian arterial duplex 07/05/11: < 50% stenosis of bilateral subclavian arteries, left > right. No change 03/16/11 Normal BP in both arms. Hence clinically not important. Future Plans 3/53/6144: METABOLIC PANEL, BASIC (31540) - one time 04/05/2017: CBC & PLATELETS (AUTO) (08676) - one time 04/05/2017: PT (PROTHROMBIN TIME) (19509) - one time Patient in clinical research study (Z00.6) Story: Patient enrollement in the Statin Intolerant Trial with Bempedoic acid "Esperion". Do not check Lipids please Pure hypercholesterolemia (E78.00) Current Plans Mechanism of underlying disease process and action of medications discussed with the patient. I discussed primary/secondary prevention and also dietary counseling was done. Patient's symptoms of claudication have gotten worse, and she made an earlier appointment to see me, states that since she last saw me 3 months ago to know she has been very limited in doing physical activity. No bluish discoloration or ulceration.  I'll set her up for peripheral arteriogram. Previously her Dopplers with normal, only CT angiogram of the abdomen and pelvis demonstrated high-grade stenosis which was confirmed at angiography, agent would like to avoid multiple and duplicate diagnostic studies. She has excellent peripheral pulses with regard to pedal pulses as before. Hence I feel it is appropriate to proceed with repeating her angiography directly and make further recommendations based on her study no changes in the medications were  done today. I'll see her back after the procedure. She is aware of the risks, benefits and complications.  CC Dr. Jani Gravel.    Signed by Melissa Page, MD (03/16/2017 6:07 PM)

## 2017-04-13 ENCOUNTER — Encounter (HOSPITAL_COMMUNITY)
Admission: RE | Disposition: A | Discharge status: Home / Self Care | Payer: Self-pay | Source: Ambulatory Visit | Attending: Cardiology

## 2017-04-13 ENCOUNTER — Ambulatory Visit (HOSPITAL_COMMUNITY)
Admission: RE | Admit: 2017-04-13 | Discharge: 2017-04-13 | Disposition: A | Discharge status: Home / Self Care | Payer: Medicare Other | Source: Ambulatory Visit | Attending: Cardiology | Admitting: Cardiology

## 2017-04-13 ENCOUNTER — Encounter (HOSPITAL_COMMUNITY): Payer: Self-pay | Admitting: Cardiology

## 2017-04-13 DIAGNOSIS — I10 Essential (primary) hypertension: Secondary | ICD-10-CM | POA: Insufficient documentation

## 2017-04-13 DIAGNOSIS — I252 Old myocardial infarction: Secondary | ICD-10-CM | POA: Diagnosis not present

## 2017-04-13 DIAGNOSIS — Z955 Presence of coronary angioplasty implant and graft: Secondary | ICD-10-CM | POA: Diagnosis not present

## 2017-04-13 DIAGNOSIS — I959 Hypotension, unspecified: Secondary | ICD-10-CM | POA: Diagnosis not present

## 2017-04-13 DIAGNOSIS — I2510 Atherosclerotic heart disease of native coronary artery without angina pectoris: Secondary | ICD-10-CM | POA: Insufficient documentation

## 2017-04-13 DIAGNOSIS — I739 Peripheral vascular disease, unspecified: Secondary | ICD-10-CM | POA: Insufficient documentation

## 2017-04-13 DIAGNOSIS — Z87891 Personal history of nicotine dependence: Secondary | ICD-10-CM | POA: Insufficient documentation

## 2017-04-13 DIAGNOSIS — E7800 Pure hypercholesterolemia, unspecified: Secondary | ICD-10-CM | POA: Insufficient documentation

## 2017-04-13 DIAGNOSIS — I6522 Occlusion and stenosis of left carotid artery: Secondary | ICD-10-CM | POA: Insufficient documentation

## 2017-04-13 HISTORY — PX: LOWER EXTREMITY ANGIOGRAPHY: CATH118251

## 2017-04-13 SURGERY — LOWER EXTREMITY ANGIOGRAPHY
Anesthesia: LOCAL

## 2017-04-13 MED ORDER — HYDRALAZINE HCL 20 MG/ML IJ SOLN
INTRAMUSCULAR | Status: AC
  Filled 2017-04-13: qty 1

## 2017-04-13 MED ORDER — HEPARIN (PORCINE) IN NACL 2-0.9 UNIT/ML-% IJ SOLN
INTRAMUSCULAR | Status: AC
  Filled 2017-04-13: qty 500

## 2017-04-13 MED ORDER — ACETAMINOPHEN 325 MG RE SUPP
325.0000 mg | RECTAL | Status: DC | PRN
  Filled 2017-04-13: qty 2

## 2017-04-13 MED ORDER — SODIUM CHLORIDE 0.9% FLUSH: 3.0000 mL | Freq: Two times a day (BID) | INTRAVENOUS | Status: DC

## 2017-04-13 MED ORDER — FENTANYL CITRATE (PF) 100 MCG/2ML IJ SOLN
INTRAMUSCULAR | Status: AC
  Filled 2017-04-13: qty 2

## 2017-04-13 MED ORDER — NITROGLYCERIN 1 MG/10 ML FOR IR/CATH LAB
INTRA_ARTERIAL | Status: AC
  Filled 2017-04-13: qty 10

## 2017-04-13 MED ORDER — FAMOTIDINE IN NACL 20-0.9 MG/50ML-% IV SOLN
40.0000 mg | Freq: Once | INTRAVENOUS | Status: AC
  Administered 2017-04-13: 20 mg via INTRAVENOUS

## 2017-04-13 MED ORDER — DIPHENHYDRAMINE HCL 50 MG/ML IJ SOLN
INTRAMUSCULAR | Status: AC
  Filled 2017-04-13: qty 1

## 2017-04-13 MED ORDER — FAMOTIDINE IN NACL 20-0.9 MG/50ML-% IV SOLN: 20.0000 mg | Freq: Once | INTRAVENOUS | Status: DC

## 2017-04-13 MED ORDER — SODIUM CHLORIDE 0.9% FLUSH: 3.0000 mL | INTRAVENOUS | Status: DC | PRN

## 2017-04-13 MED ORDER — FENTANYL CITRATE (PF) 100 MCG/2ML IJ SOLN
INTRAMUSCULAR | Status: DC | PRN
  Administered 2017-04-13: 25 ug via INTRAVENOUS

## 2017-04-13 MED ORDER — NITROGLYCERIN 1 MG/10 ML FOR IR/CATH LAB
INTRA_ARTERIAL | Status: DC | PRN
  Administered 2017-04-13: 500 ug via INTRA_ARTERIAL

## 2017-04-13 MED ORDER — FAMOTIDINE IN NACL 20-0.9 MG/50ML-% IV SOLN
INTRAVENOUS | Status: AC
  Filled 2017-04-13: qty 50

## 2017-04-13 MED ORDER — ONDANSETRON HCL 4 MG/2ML IJ SOLN: 4.0000 mg | Freq: Four times a day (QID) | INTRAMUSCULAR | Status: DC | PRN

## 2017-04-13 MED ORDER — ALUM & MAG HYDROXIDE-SIMETH 200-200-20 MG/5ML PO SUSP
15.0000 mL | ORAL | Status: DC | PRN
  Filled 2017-04-13: qty 30

## 2017-04-13 MED ORDER — MIDAZOLAM HCL 2 MG/2ML IJ SOLN
INTRAMUSCULAR | Status: AC
  Filled 2017-04-13: qty 2

## 2017-04-13 MED ORDER — DIPHENHYDRAMINE HCL 50 MG/ML IJ SOLN
25.0000 mg | Freq: Once | INTRAMUSCULAR | Status: AC
  Administered 2017-04-13: 25 mg via INTRAVENOUS

## 2017-04-13 MED ORDER — SODIUM CHLORIDE 0.9 % IV SOLN: 250.0000 mL | INTRAVENOUS | Status: DC | PRN

## 2017-04-13 MED ORDER — MIDAZOLAM HCL 2 MG/2ML IJ SOLN
INTRAMUSCULAR | Status: DC | PRN
  Administered 2017-04-13: 1 mg via INTRAVENOUS

## 2017-04-13 MED ORDER — SODIUM CHLORIDE 0.9 % IV SOLN: 1.0000 mL/kg/h | INTRAVENOUS | Status: DC

## 2017-04-13 MED ORDER — HYDRALAZINE HCL 20 MG/ML IJ SOLN
5.0000 mg | INTRAMUSCULAR | Status: DC | PRN
  Administered 2017-04-13: 5 mg via INTRAVENOUS

## 2017-04-13 MED ORDER — LIDOCAINE HCL (PF) 1 % IJ SOLN
INTRAMUSCULAR | Status: DC | PRN
  Administered 2017-04-13: 15 mL

## 2017-04-13 MED ORDER — IODIXANOL 320 MG/ML IV SOLN
INTRAVENOUS | Status: DC | PRN
  Administered 2017-04-13: 80 mL via INTRA_ARTERIAL

## 2017-04-13 MED ORDER — ACETAMINOPHEN 325 MG PO TABS: 325.0000 mg | ORAL_TABLET | ORAL | Status: DC | PRN

## 2017-04-13 MED ORDER — SODIUM CHLORIDE 0.9 % IV SOLN
INTRAVENOUS | Status: DC
  Administered 2017-04-13: 06:00:00 via INTRAVENOUS

## 2017-04-13 MED ORDER — SODIUM CHLORIDE 0.9 % IV SOLN
500.0000 mL | Freq: Once | INTRAVENOUS | Status: AC | PRN
  Administered 2017-04-13: 10:00:00 via INTRAVENOUS

## 2017-04-13 MED ORDER — HEPARIN (PORCINE) IN NACL 2-0.9 UNIT/ML-% IJ SOLN
INTRAMUSCULAR | Status: AC | PRN
  Administered 2017-04-13: 1000 mL

## 2017-04-13 MED ORDER — LIDOCAINE HCL (PF) 1 % IJ SOLN
INTRAMUSCULAR | Status: AC
  Filled 2017-04-13: qty 30

## 2017-04-13 SURGICAL SUPPLY — 11 items
CATH OMNI FLUSH 5F 65CM (CATHETERS) ×2 IMPLANT
HEMOSTASIS PAD V PLUS (HEMOSTASIS) ×2 IMPLANT
KIT MICROINTRODUCER STIFF 5F (SHEATH) ×2 IMPLANT
KIT PV (KITS) ×2 IMPLANT
SHEATH PINNACLE 5F 10CM (SHEATH) ×2 IMPLANT
STOPCOCK MORSE 400PSI 3WAY (MISCELLANEOUS) ×2 IMPLANT
SYRINGE MEDRAD AVANTA MACH 7 (SYRINGE) ×2 IMPLANT
TRANSDUCER W/STOPCOCK (MISCELLANEOUS) ×2 IMPLANT
TRAY PV CATH (CUSTOM PROCEDURE TRAY) ×2 IMPLANT
TUBING CIL FLEX 10 FLL-RA (TUBING) ×2 IMPLANT
WIRE HITORQ VERSACORE ST 145CM (WIRE) ×2 IMPLANT

## 2017-04-13 NOTE — Interval H&P Note (Signed)
History and Physical Interval Note:  04/13/2017 7:40 AM  [name]  has presented today for surgery, with the diagnosis of pvd with claudication  The various methods of treatment have been discussed with the patient and family. After consideration of risks, benefits and other options for treatment, the patient has consented to  Procedure(s): Lower Extremity Angiography (N/A) and possible angioplasty as a surgical intervention .  The patient's history has been reviewed, patient examined, no change in status, stable for surgery.  I have reviewed the patient's chart and labs.  Questions were answered to the patient's satisfaction.     Adrian Prows

## 2017-04-13 NOTE — Progress Notes (Addendum)
Site area: RFA Site Prior to Removal:  Level 0 Pressure Applied For:25 min Manual:   yes Patient Status During Pull:  stable Post Pull Site:  Level 0 Post Pull Instructions Given:  yes Post Pull Pulses Present: palpable Dressing Applied:  tegaderm Bedrest begins @ 5449 till 1425 Comments:V pad used

## 2017-04-13 NOTE — Progress Notes (Signed)
Patient did have some low blood pressure readings when she first came to the unit.  PRN saline bolus given of 500 cc and Dr. Einar Gip was notified.  Patients blood pressure returned to normal quickly with no other complications. Patient was discharged to home with no issues and no complaints with family and in stable condition.  Patient instructed to have family with her tonight.

## 2017-04-13 NOTE — Discharge Instructions (Signed)

## 2017-04-22 DIAGNOSIS — I6523 Occlusion and stenosis of bilateral carotid arteries: Secondary | ICD-10-CM | POA: Diagnosis not present

## 2017-04-22 DIAGNOSIS — I70213 Atherosclerosis of native arteries of extremities with intermittent claudication, bilateral legs: Secondary | ICD-10-CM | POA: Diagnosis not present

## 2017-04-22 DIAGNOSIS — Z006 Encounter for examination for normal comparison and control in clinical research program: Secondary | ICD-10-CM | POA: Diagnosis not present

## 2017-04-22 DIAGNOSIS — I10 Essential (primary) hypertension: Secondary | ICD-10-CM | POA: Diagnosis not present

## 2017-05-28 ENCOUNTER — Other Ambulatory Visit: Payer: Self-pay | Admitting: Internal Medicine

## 2017-05-28 DIAGNOSIS — Z1231 Encounter for screening mammogram for malignant neoplasm of breast: Secondary | ICD-10-CM

## 2017-06-03 DIAGNOSIS — M5136 Other intervertebral disc degeneration, lumbar region: Secondary | ICD-10-CM | POA: Diagnosis not present

## 2017-06-03 DIAGNOSIS — I739 Peripheral vascular disease, unspecified: Secondary | ICD-10-CM | POA: Diagnosis not present

## 2017-06-03 DIAGNOSIS — M25551 Pain in right hip: Secondary | ICD-10-CM | POA: Diagnosis not present

## 2017-06-03 DIAGNOSIS — M25512 Pain in left shoulder: Secondary | ICD-10-CM | POA: Diagnosis not present

## 2017-06-03 IMAGING — CT CT HEAD W/O CM
2 series · 16 of 30 positions shown, 18 images · non-contrast
Comparison: 08/15/2014

CLINICAL DATA: Fall multiple times over the last several days.
Headache.

EXAM:
CT HEAD WITHOUT CONTRAST
TECHNIQUE: Contiguous axial images were obtained from the base of the skull
through the vertex without intravenous contrast.

[Series 201: head w/o, idose (1) · axial · non-contrast · 0.49mm/px · z∈[+62,+182]mm · 8 of 32 slices shown, 10 images]
[im 4/32  brain]
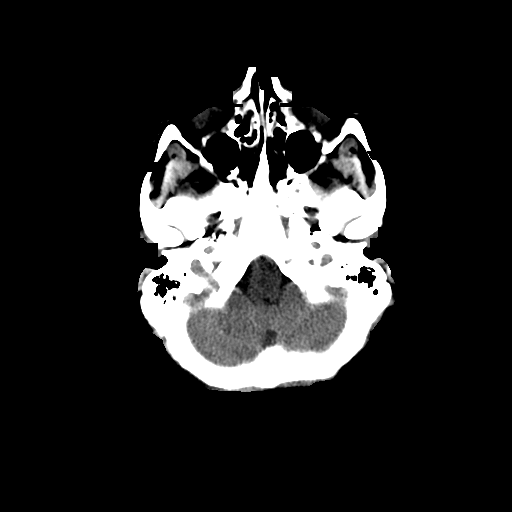
[im 4/32  bone]
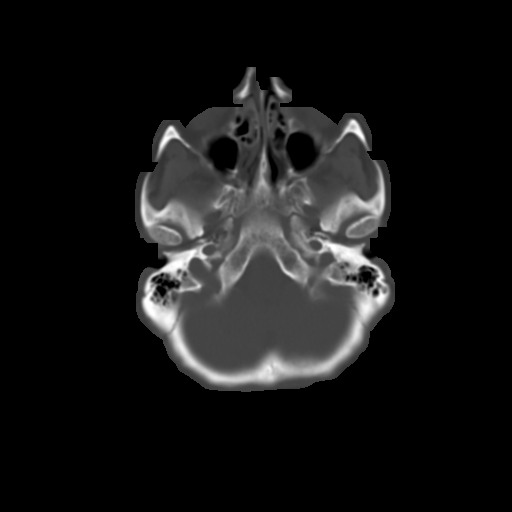
[im 7/32  brain]
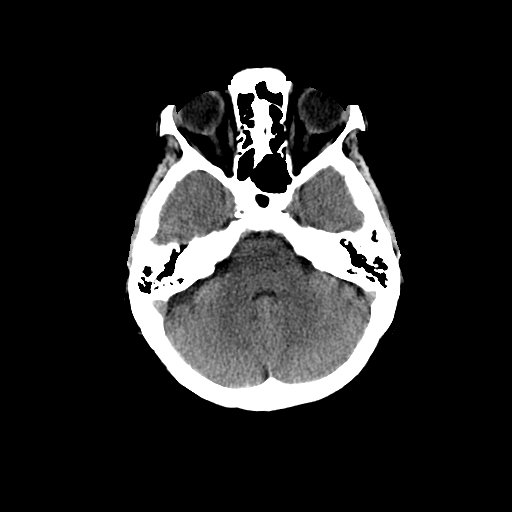
[im 11/32  brain]
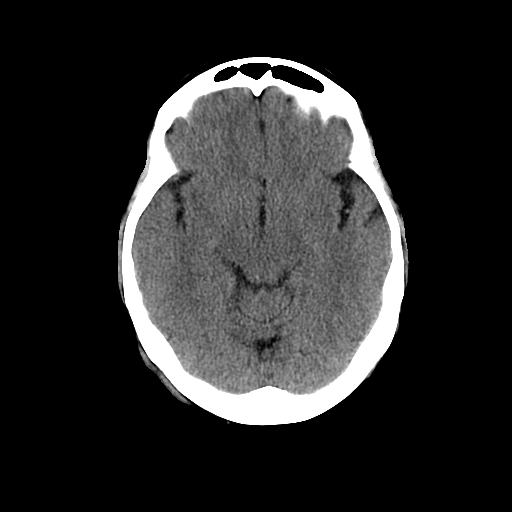
[im 14/32  brain]
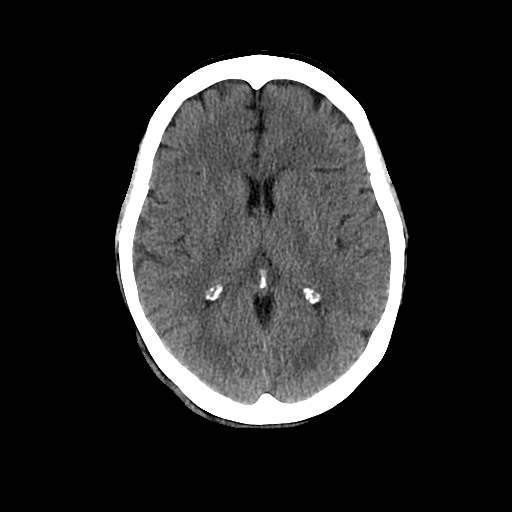
[im 18/32  brain]
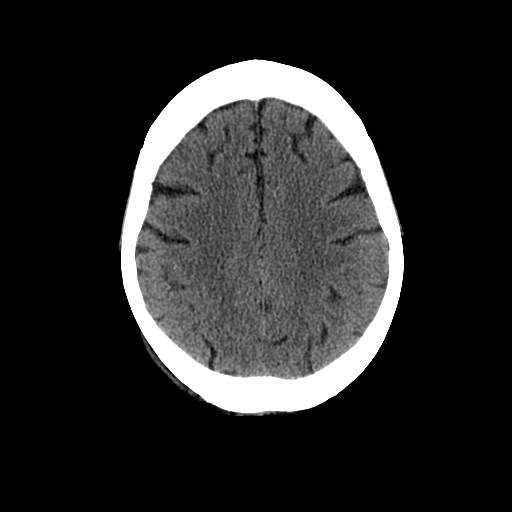
[im 18/32  bone]
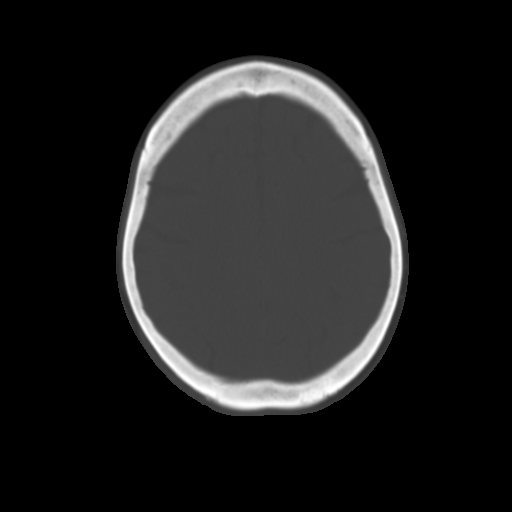
[im 21/32  brain]
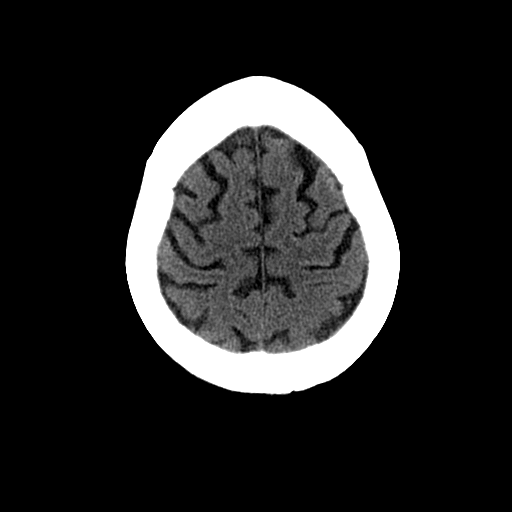
[im 25/32  brain]
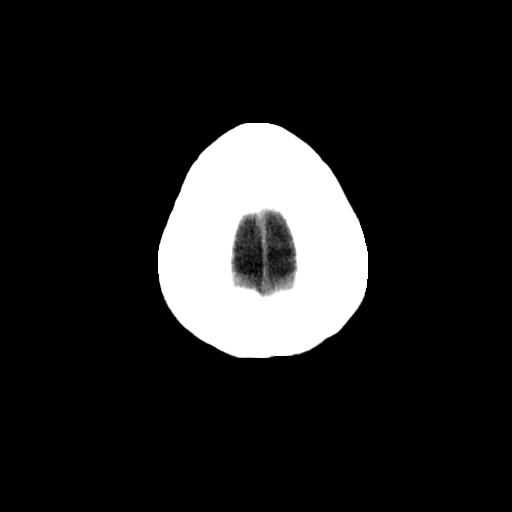
[im 28/32  brain]
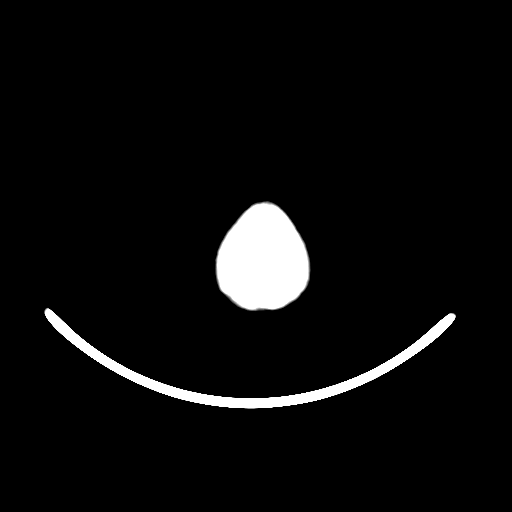

[Series 202: head w/o bone, idose (1) · axial · non-contrast · 0.49mm/px · z∈[+61,+186]mm · 8 of 64 slices shown]
[im 7/64  bone]
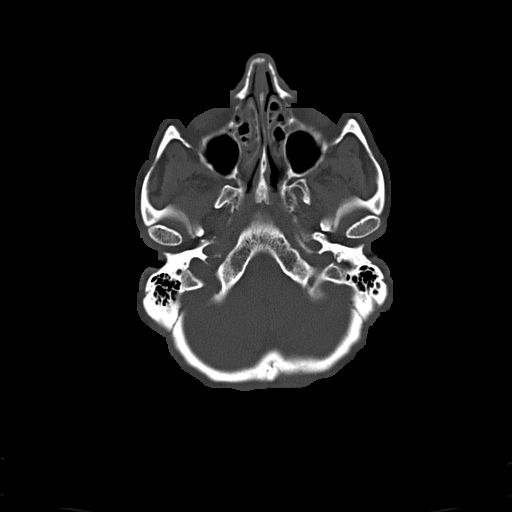
[im 14/64  bone]
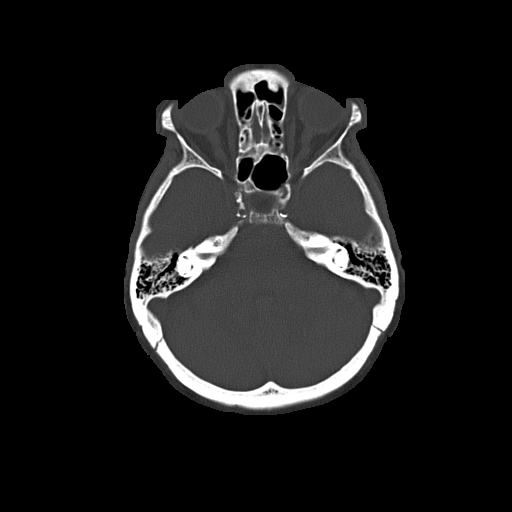
[im 20/64  bone]
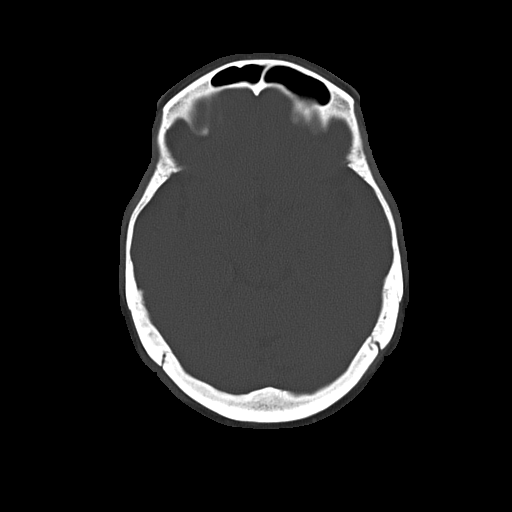
[im 27/64  bone]
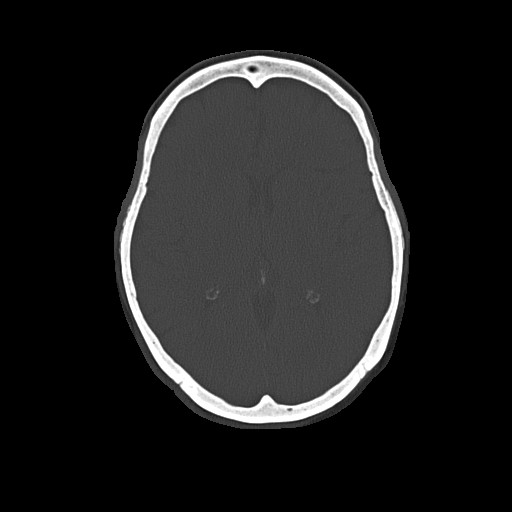
[im 37/64  bone]
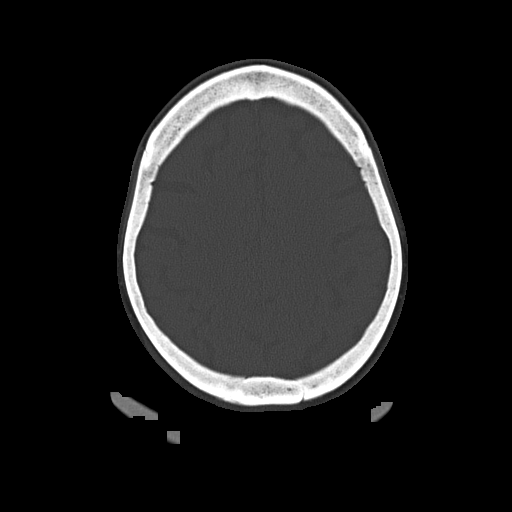
[im 44/64  bone]
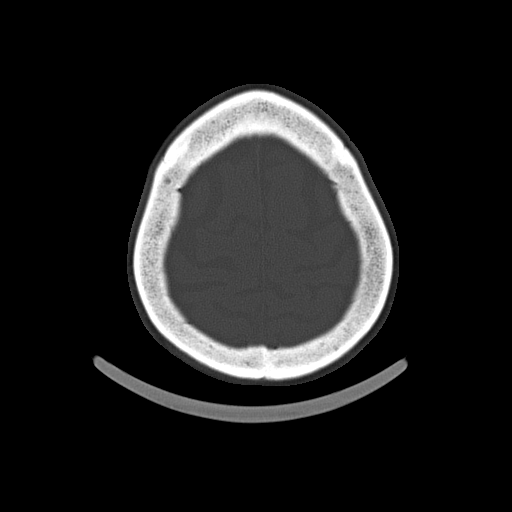
[im 50/64  bone]
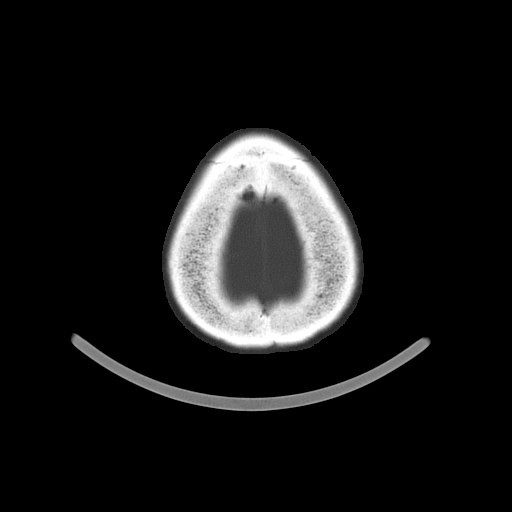
[im 57/64  bone]
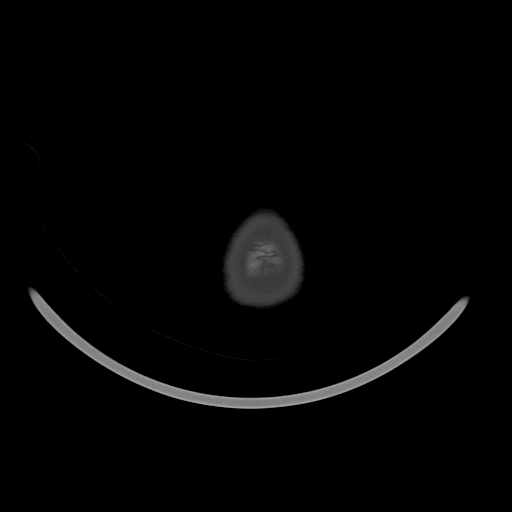

[16 of 30 positions shown; findings below may reference images not displayed]

FINDINGS: No acute intracranial abnormality. Specifically, no hemorrhage,
hydrocephalus, mass lesion, acute infarction, or significant
intracranial injury. No acute calvarial abnormality.

Mucosal thickening in the ethmoid air cells. No air-fluid levels.
Mastoid air cells are clear.
IMPRESSION: No intracranial abnormality.

Chronic ethmoid sinusitis.

## 2017-06-07 ENCOUNTER — Other Ambulatory Visit: Payer: Self-pay | Admitting: Pain Medicine

## 2017-06-07 DIAGNOSIS — M79604 Pain in right leg: Secondary | ICD-10-CM

## 2017-06-07 DIAGNOSIS — M545 Low back pain: Secondary | ICD-10-CM

## 2017-06-11 ENCOUNTER — Ambulatory Visit
Admission: RE | Admit: 2017-06-11 | Discharge: 2017-06-11 | Disposition: A | Discharge status: Home / Self Care | Payer: Medicare Other | Source: Ambulatory Visit | Attending: Physician Assistant | Admitting: Physician Assistant

## 2017-06-11 ENCOUNTER — Ambulatory Visit
Admission: RE | Admit: 2017-06-11 | Discharge: 2017-06-11 | Disposition: A | Discharge status: Home / Self Care | Payer: Medicare Other | Source: Ambulatory Visit | Attending: Pain Medicine | Admitting: Pain Medicine

## 2017-06-11 ENCOUNTER — Other Ambulatory Visit: Payer: Self-pay | Admitting: Physician Assistant

## 2017-06-11 DIAGNOSIS — M1611 Unilateral primary osteoarthritis, right hip: Secondary | ICD-10-CM | POA: Diagnosis not present

## 2017-06-11 DIAGNOSIS — M25512 Pain in left shoulder: Secondary | ICD-10-CM

## 2017-06-11 DIAGNOSIS — M545 Low back pain: Secondary | ICD-10-CM

## 2017-06-11 DIAGNOSIS — M25551 Pain in right hip: Secondary | ICD-10-CM

## 2017-06-11 DIAGNOSIS — M19012 Primary osteoarthritis, left shoulder: Secondary | ICD-10-CM | POA: Diagnosis not present

## 2017-06-11 DIAGNOSIS — M79604 Pain in right leg: Secondary | ICD-10-CM

## 2017-06-15 DIAGNOSIS — M545 Low back pain: Secondary | ICD-10-CM | POA: Diagnosis not present

## 2017-06-15 DIAGNOSIS — M79604 Pain in right leg: Secondary | ICD-10-CM | POA: Diagnosis not present

## 2017-06-25 DIAGNOSIS — H04123 Dry eye syndrome of bilateral lacrimal glands: Secondary | ICD-10-CM | POA: Diagnosis not present

## 2017-06-25 DIAGNOSIS — H524 Presbyopia: Secondary | ICD-10-CM | POA: Diagnosis not present

## 2017-06-25 DIAGNOSIS — H26492 Other secondary cataract, left eye: Secondary | ICD-10-CM | POA: Diagnosis not present

## 2017-06-25 DIAGNOSIS — H353111 Nonexudative age-related macular degeneration, right eye, early dry stage: Secondary | ICD-10-CM | POA: Diagnosis not present

## 2017-07-02 DIAGNOSIS — M47816 Spondylosis without myelopathy or radiculopathy, lumbar region: Secondary | ICD-10-CM | POA: Diagnosis not present

## 2017-07-02 DIAGNOSIS — M5136 Other intervertebral disc degeneration, lumbar region: Secondary | ICD-10-CM | POA: Diagnosis not present

## 2017-07-02 DIAGNOSIS — M79606 Pain in leg, unspecified: Secondary | ICD-10-CM | POA: Diagnosis not present

## 2017-07-02 DIAGNOSIS — G894 Chronic pain syndrome: Secondary | ICD-10-CM | POA: Diagnosis not present

## 2017-07-08 ENCOUNTER — Ambulatory Visit
Admission: RE | Admit: 2017-07-08 | Discharge: 2017-07-08 | Disposition: A | Discharge status: Home / Self Care | Payer: Medicare Other | Source: Ambulatory Visit | Attending: Internal Medicine | Admitting: Internal Medicine

## 2017-07-08 DIAGNOSIS — Z1231 Encounter for screening mammogram for malignant neoplasm of breast: Secondary | ICD-10-CM

## 2017-08-11 DIAGNOSIS — M5136 Other intervertebral disc degeneration, lumbar region: Secondary | ICD-10-CM | POA: Diagnosis not present

## 2017-08-11 DIAGNOSIS — M25519 Pain in unspecified shoulder: Secondary | ICD-10-CM | POA: Diagnosis not present

## 2017-08-11 DIAGNOSIS — M48062 Spinal stenosis, lumbar region with neurogenic claudication: Secondary | ICD-10-CM | POA: Diagnosis not present

## 2017-08-11 DIAGNOSIS — G894 Chronic pain syndrome: Secondary | ICD-10-CM | POA: Diagnosis not present

## 2017-08-11 DIAGNOSIS — M25559 Pain in unspecified hip: Secondary | ICD-10-CM | POA: Diagnosis not present

## 2017-08-12 DIAGNOSIS — I10 Essential (primary) hypertension: Secondary | ICD-10-CM | POA: Diagnosis not present

## 2017-08-24 DIAGNOSIS — M316 Other giant cell arteritis: Secondary | ICD-10-CM | POA: Diagnosis not present

## 2017-08-24 DIAGNOSIS — H6123 Impacted cerumen, bilateral: Secondary | ICD-10-CM | POA: Diagnosis not present

## 2017-08-24 DIAGNOSIS — E7800 Pure hypercholesterolemia, unspecified: Secondary | ICD-10-CM | POA: Diagnosis not present

## 2017-08-24 DIAGNOSIS — Z23 Encounter for immunization: Secondary | ICD-10-CM | POA: Diagnosis not present

## 2017-08-24 DIAGNOSIS — I10 Essential (primary) hypertension: Secondary | ICD-10-CM | POA: Diagnosis not present

## 2017-08-30 DIAGNOSIS — M316 Other giant cell arteritis: Secondary | ICD-10-CM | POA: Diagnosis not present

## 2017-08-30 DIAGNOSIS — M1990 Unspecified osteoarthritis, unspecified site: Secondary | ICD-10-CM | POA: Diagnosis not present

## 2017-08-30 DIAGNOSIS — R700 Elevated erythrocyte sedimentation rate: Secondary | ICD-10-CM | POA: Diagnosis not present

## 2017-08-30 DIAGNOSIS — M8589 Other specified disorders of bone density and structure, multiple sites: Secondary | ICD-10-CM | POA: Diagnosis not present

## 2017-09-13 DIAGNOSIS — M48062 Spinal stenosis, lumbar region with neurogenic claudication: Secondary | ICD-10-CM | POA: Diagnosis not present

## 2017-09-16 DIAGNOSIS — I6523 Occlusion and stenosis of bilateral carotid arteries: Secondary | ICD-10-CM | POA: Diagnosis not present

## 2017-11-03 DIAGNOSIS — N390 Urinary tract infection, site not specified: Secondary | ICD-10-CM | POA: Diagnosis not present

## 2017-11-03 DIAGNOSIS — R300 Dysuria: Secondary | ICD-10-CM | POA: Diagnosis not present

## 2017-11-08 DIAGNOSIS — M545 Low back pain: Secondary | ICD-10-CM | POA: Diagnosis not present

## 2017-11-08 DIAGNOSIS — M79651 Pain in right thigh: Secondary | ICD-10-CM | POA: Diagnosis not present

## 2017-11-08 DIAGNOSIS — G894 Chronic pain syndrome: Secondary | ICD-10-CM | POA: Diagnosis not present

## 2017-11-08 DIAGNOSIS — M48062 Spinal stenosis, lumbar region with neurogenic claudication: Secondary | ICD-10-CM | POA: Diagnosis not present

## 2017-11-11 DIAGNOSIS — I2510 Atherosclerotic heart disease of native coronary artery without angina pectoris: Secondary | ICD-10-CM | POA: Diagnosis not present

## 2017-11-11 DIAGNOSIS — I10 Essential (primary) hypertension: Secondary | ICD-10-CM | POA: Diagnosis not present

## 2017-11-11 DIAGNOSIS — I6523 Occlusion and stenosis of bilateral carotid arteries: Secondary | ICD-10-CM | POA: Diagnosis not present

## 2017-11-11 DIAGNOSIS — I70213 Atherosclerosis of native arteries of extremities with intermittent claudication, bilateral legs: Secondary | ICD-10-CM | POA: Diagnosis not present

## 2017-11-25 DIAGNOSIS — R700 Elevated erythrocyte sedimentation rate: Secondary | ICD-10-CM | POA: Diagnosis not present

## 2017-11-25 DIAGNOSIS — M316 Other giant cell arteritis: Secondary | ICD-10-CM | POA: Diagnosis not present

## 2017-11-25 DIAGNOSIS — M8580 Other specified disorders of bone density and structure, unspecified site: Secondary | ICD-10-CM | POA: Diagnosis not present

## 2017-12-02 DIAGNOSIS — N390 Urinary tract infection, site not specified: Secondary | ICD-10-CM | POA: Diagnosis not present

## 2017-12-02 DIAGNOSIS — M1990 Unspecified osteoarthritis, unspecified site: Secondary | ICD-10-CM | POA: Diagnosis not present

## 2017-12-02 DIAGNOSIS — R700 Elevated erythrocyte sedimentation rate: Secondary | ICD-10-CM | POA: Diagnosis not present

## 2017-12-02 DIAGNOSIS — M316 Other giant cell arteritis: Secondary | ICD-10-CM | POA: Diagnosis not present

## 2017-12-02 DIAGNOSIS — M8589 Other specified disorders of bone density and structure, multiple sites: Secondary | ICD-10-CM | POA: Diagnosis not present

## 2017-12-30 DIAGNOSIS — J069 Acute upper respiratory infection, unspecified: Secondary | ICD-10-CM | POA: Diagnosis not present

## 2018-01-12 DIAGNOSIS — J329 Chronic sinusitis, unspecified: Secondary | ICD-10-CM | POA: Diagnosis not present

## 2018-01-12 DIAGNOSIS — I10 Essential (primary) hypertension: Secondary | ICD-10-CM | POA: Diagnosis not present

## 2018-01-12 DIAGNOSIS — E7800 Pure hypercholesterolemia, unspecified: Secondary | ICD-10-CM | POA: Diagnosis not present

## 2018-01-18 DIAGNOSIS — J019 Acute sinusitis, unspecified: Secondary | ICD-10-CM | POA: Diagnosis not present

## 2018-03-09 DIAGNOSIS — M316 Other giant cell arteritis: Secondary | ICD-10-CM | POA: Diagnosis not present

## 2018-03-09 DIAGNOSIS — I10 Essential (primary) hypertension: Secondary | ICD-10-CM | POA: Diagnosis not present

## 2018-03-10 DIAGNOSIS — R700 Elevated erythrocyte sedimentation rate: Secondary | ICD-10-CM | POA: Diagnosis not present

## 2018-03-10 DIAGNOSIS — M316 Other giant cell arteritis: Secondary | ICD-10-CM | POA: Diagnosis not present

## 2018-03-10 DIAGNOSIS — M8589 Other specified disorders of bone density and structure, multiple sites: Secondary | ICD-10-CM | POA: Diagnosis not present

## 2018-03-10 DIAGNOSIS — M1990 Unspecified osteoarthritis, unspecified site: Secondary | ICD-10-CM | POA: Diagnosis not present

## 2018-03-16 DIAGNOSIS — R011 Cardiac murmur, unspecified: Secondary | ICD-10-CM | POA: Diagnosis not present

## 2018-03-16 DIAGNOSIS — I10 Essential (primary) hypertension: Secondary | ICD-10-CM | POA: Diagnosis not present

## 2018-03-16 DIAGNOSIS — M316 Other giant cell arteritis: Secondary | ICD-10-CM | POA: Diagnosis not present

## 2018-03-16 DIAGNOSIS — M8580 Other specified disorders of bone density and structure, unspecified site: Secondary | ICD-10-CM | POA: Diagnosis not present

## 2018-03-16 DIAGNOSIS — E785 Hyperlipidemia, unspecified: Secondary | ICD-10-CM | POA: Diagnosis not present

## 2018-03-16 DIAGNOSIS — R700 Elevated erythrocyte sedimentation rate: Secondary | ICD-10-CM | POA: Diagnosis not present

## 2018-05-04 DIAGNOSIS — R700 Elevated erythrocyte sedimentation rate: Secondary | ICD-10-CM | POA: Diagnosis not present

## 2018-05-17 DIAGNOSIS — I6523 Occlusion and stenosis of bilateral carotid arteries: Secondary | ICD-10-CM | POA: Diagnosis not present

## 2018-06-06 ENCOUNTER — Other Ambulatory Visit: Payer: Self-pay | Admitting: Internal Medicine

## 2018-06-06 DIAGNOSIS — Z1231 Encounter for screening mammogram for malignant neoplasm of breast: Secondary | ICD-10-CM

## 2018-06-09 DIAGNOSIS — M1990 Unspecified osteoarthritis, unspecified site: Secondary | ICD-10-CM | POA: Diagnosis not present

## 2018-06-09 DIAGNOSIS — M316 Other giant cell arteritis: Secondary | ICD-10-CM | POA: Diagnosis not present

## 2018-06-09 DIAGNOSIS — R700 Elevated erythrocyte sedimentation rate: Secondary | ICD-10-CM | POA: Diagnosis not present

## 2018-06-09 DIAGNOSIS — M8589 Other specified disorders of bone density and structure, multiple sites: Secondary | ICD-10-CM | POA: Diagnosis not present

## 2018-06-16 DIAGNOSIS — E785 Hyperlipidemia, unspecified: Secondary | ICD-10-CM | POA: Diagnosis not present

## 2018-06-16 DIAGNOSIS — R700 Elevated erythrocyte sedimentation rate: Secondary | ICD-10-CM | POA: Diagnosis not present

## 2018-06-16 DIAGNOSIS — I10 Essential (primary) hypertension: Secondary | ICD-10-CM | POA: Diagnosis not present

## 2018-06-27 DIAGNOSIS — H43813 Vitreous degeneration, bilateral: Secondary | ICD-10-CM | POA: Diagnosis not present

## 2018-06-27 DIAGNOSIS — H04123 Dry eye syndrome of bilateral lacrimal glands: Secondary | ICD-10-CM | POA: Diagnosis not present

## 2018-06-27 DIAGNOSIS — Z961 Presence of intraocular lens: Secondary | ICD-10-CM | POA: Diagnosis not present

## 2018-06-27 DIAGNOSIS — H524 Presbyopia: Secondary | ICD-10-CM | POA: Diagnosis not present

## 2018-06-28 DIAGNOSIS — I10 Essential (primary) hypertension: Secondary | ICD-10-CM | POA: Diagnosis not present

## 2018-06-28 DIAGNOSIS — E785 Hyperlipidemia, unspecified: Secondary | ICD-10-CM | POA: Diagnosis not present

## 2018-07-01 DIAGNOSIS — Z23 Encounter for immunization: Secondary | ICD-10-CM | POA: Diagnosis not present

## 2018-07-12 ENCOUNTER — Ambulatory Visit
Admission: RE | Admit: 2018-07-12 | Discharge: 2018-07-12 | Disposition: A | Discharge status: Home / Self Care | Payer: Medicare Other | Source: Ambulatory Visit | Attending: Internal Medicine | Admitting: Internal Medicine

## 2018-07-12 DIAGNOSIS — Z1231 Encounter for screening mammogram for malignant neoplasm of breast: Secondary | ICD-10-CM

## 2018-10-13 DIAGNOSIS — R700 Elevated erythrocyte sedimentation rate: Secondary | ICD-10-CM | POA: Diagnosis not present

## 2018-10-13 DIAGNOSIS — M316 Other giant cell arteritis: Secondary | ICD-10-CM | POA: Diagnosis not present

## 2018-10-13 DIAGNOSIS — M8589 Other specified disorders of bone density and structure, multiple sites: Secondary | ICD-10-CM | POA: Diagnosis not present

## 2018-10-13 DIAGNOSIS — M1990 Unspecified osteoarthritis, unspecified site: Secondary | ICD-10-CM | POA: Diagnosis not present

## 2018-11-10 DIAGNOSIS — I2510 Atherosclerotic heart disease of native coronary artery without angina pectoris: Secondary | ICD-10-CM | POA: Diagnosis not present

## 2018-11-10 DIAGNOSIS — I10 Essential (primary) hypertension: Secondary | ICD-10-CM | POA: Diagnosis not present

## 2018-11-10 DIAGNOSIS — I70213 Atherosclerosis of native arteries of extremities with intermittent claudication, bilateral legs: Secondary | ICD-10-CM | POA: Diagnosis not present

## 2018-11-10 DIAGNOSIS — I6523 Occlusion and stenosis of bilateral carotid arteries: Secondary | ICD-10-CM | POA: Diagnosis not present

## 2018-12-27 DIAGNOSIS — I10 Essential (primary) hypertension: Secondary | ICD-10-CM | POA: Diagnosis not present

## 2018-12-27 DIAGNOSIS — E7800 Pure hypercholesterolemia, unspecified: Secondary | ICD-10-CM | POA: Diagnosis not present

## 2018-12-27 DIAGNOSIS — E559 Vitamin D deficiency, unspecified: Secondary | ICD-10-CM | POA: Diagnosis not present

## 2019-01-30 ENCOUNTER — Other Ambulatory Visit: Payer: Self-pay | Admitting: Cardiology

## 2019-01-30 DIAGNOSIS — M8580 Other specified disorders of bone density and structure, unspecified site: Secondary | ICD-10-CM | POA: Diagnosis not present

## 2019-01-30 DIAGNOSIS — I6523 Occlusion and stenosis of bilateral carotid arteries: Secondary | ICD-10-CM

## 2019-01-30 DIAGNOSIS — R011 Cardiac murmur, unspecified: Secondary | ICD-10-CM | POA: Diagnosis not present

## 2019-01-30 DIAGNOSIS — H9190 Unspecified hearing loss, unspecified ear: Secondary | ICD-10-CM | POA: Diagnosis not present

## 2019-01-30 DIAGNOSIS — E559 Vitamin D deficiency, unspecified: Secondary | ICD-10-CM | POA: Diagnosis not present

## 2019-01-30 DIAGNOSIS — Z0000 Encounter for general adult medical examination without abnormal findings: Secondary | ICD-10-CM | POA: Diagnosis not present

## 2019-01-30 DIAGNOSIS — I2510 Atherosclerotic heart disease of native coronary artery without angina pectoris: Secondary | ICD-10-CM | POA: Diagnosis not present

## 2019-01-30 DIAGNOSIS — R1030 Lower abdominal pain, unspecified: Secondary | ICD-10-CM | POA: Diagnosis not present

## 2019-01-30 DIAGNOSIS — M316 Other giant cell arteritis: Secondary | ICD-10-CM | POA: Diagnosis not present

## 2019-01-30 DIAGNOSIS — E7800 Pure hypercholesterolemia, unspecified: Secondary | ICD-10-CM | POA: Diagnosis not present

## 2019-01-30 DIAGNOSIS — I739 Peripheral vascular disease, unspecified: Secondary | ICD-10-CM | POA: Diagnosis not present

## 2019-01-30 DIAGNOSIS — R000 Tachycardia, unspecified: Secondary | ICD-10-CM | POA: Diagnosis not present

## 2019-01-30 DIAGNOSIS — I10 Essential (primary) hypertension: Secondary | ICD-10-CM | POA: Diagnosis not present

## 2019-01-31 DIAGNOSIS — I6529 Occlusion and stenosis of unspecified carotid artery: Secondary | ICD-10-CM | POA: Diagnosis not present

## 2019-01-31 DIAGNOSIS — I739 Peripheral vascular disease, unspecified: Secondary | ICD-10-CM | POA: Diagnosis not present

## 2019-01-31 DIAGNOSIS — I2510 Atherosclerotic heart disease of native coronary artery without angina pectoris: Secondary | ICD-10-CM | POA: Diagnosis not present

## 2019-01-31 DIAGNOSIS — M1990 Unspecified osteoarthritis, unspecified site: Secondary | ICD-10-CM | POA: Diagnosis not present

## 2019-01-31 DIAGNOSIS — M316 Other giant cell arteritis: Secondary | ICD-10-CM | POA: Diagnosis not present

## 2019-01-31 DIAGNOSIS — R700 Elevated erythrocyte sedimentation rate: Secondary | ICD-10-CM | POA: Diagnosis not present

## 2019-01-31 DIAGNOSIS — M8589 Other specified disorders of bone density and structure, multiple sites: Secondary | ICD-10-CM | POA: Diagnosis not present

## 2019-01-31 DIAGNOSIS — I10 Essential (primary) hypertension: Secondary | ICD-10-CM | POA: Diagnosis not present

## 2019-05-03 DIAGNOSIS — M79604 Pain in right leg: Secondary | ICD-10-CM | POA: Diagnosis not present

## 2019-05-03 DIAGNOSIS — R1031 Right lower quadrant pain: Secondary | ICD-10-CM | POA: Diagnosis not present

## 2019-05-03 DIAGNOSIS — M79651 Pain in right thigh: Secondary | ICD-10-CM | POA: Diagnosis not present

## 2019-05-03 DIAGNOSIS — M48062 Spinal stenosis, lumbar region with neurogenic claudication: Secondary | ICD-10-CM | POA: Diagnosis not present

## 2019-05-18 ENCOUNTER — Other Ambulatory Visit: Payer: Self-pay

## 2019-05-18 ENCOUNTER — Ambulatory Visit (INDEPENDENT_AMBULATORY_CARE_PROVIDER_SITE_OTHER): Payer: Medicare Other

## 2019-05-18 DIAGNOSIS — I6523 Occlusion and stenosis of bilateral carotid arteries: Secondary | ICD-10-CM

## 2019-05-28 ENCOUNTER — Other Ambulatory Visit: Payer: Self-pay | Admitting: Cardiology

## 2019-05-28 DIAGNOSIS — I6523 Occlusion and stenosis of bilateral carotid arteries: Secondary | ICD-10-CM

## 2019-05-29 ENCOUNTER — Ambulatory Visit: Payer: Medicare Other | Admitting: Cardiology

## 2019-06-05 DIAGNOSIS — M79604 Pain in right leg: Secondary | ICD-10-CM | POA: Diagnosis not present

## 2019-06-05 DIAGNOSIS — M48062 Spinal stenosis, lumbar region with neurogenic claudication: Secondary | ICD-10-CM | POA: Diagnosis not present

## 2019-06-05 DIAGNOSIS — M79651 Pain in right thigh: Secondary | ICD-10-CM | POA: Diagnosis not present

## 2019-06-05 DIAGNOSIS — R1031 Right lower quadrant pain: Secondary | ICD-10-CM | POA: Diagnosis not present

## 2019-06-12 ENCOUNTER — Other Ambulatory Visit: Payer: Self-pay | Admitting: Internal Medicine

## 2019-06-12 DIAGNOSIS — Z1231 Encounter for screening mammogram for malignant neoplasm of breast: Secondary | ICD-10-CM

## 2019-06-23 ENCOUNTER — Other Ambulatory Visit: Payer: Self-pay

## 2019-06-23 ENCOUNTER — Encounter: Payer: Self-pay | Admitting: Cardiology

## 2019-06-23 ENCOUNTER — Ambulatory Visit (INDEPENDENT_AMBULATORY_CARE_PROVIDER_SITE_OTHER): Payer: Medicare Other | Admitting: Cardiology

## 2019-06-23 VITALS — BP 161/78 | HR 70 | Temp 98.0°F | Ht 64.0 in | Wt 148.0 lb

## 2019-06-23 DIAGNOSIS — I6523 Occlusion and stenosis of bilateral carotid arteries: Secondary | ICD-10-CM | POA: Diagnosis not present

## 2019-06-23 DIAGNOSIS — Z006 Encounter for examination for normal comparison and control in clinical research program: Secondary | ICD-10-CM | POA: Diagnosis not present

## 2019-06-23 DIAGNOSIS — I739 Peripheral vascular disease, unspecified: Secondary | ICD-10-CM

## 2019-06-23 DIAGNOSIS — Z789 Other specified health status: Secondary | ICD-10-CM

## 2019-06-23 DIAGNOSIS — I10 Essential (primary) hypertension: Secondary | ICD-10-CM

## 2019-06-23 DIAGNOSIS — I2510 Atherosclerotic heart disease of native coronary artery without angina pectoris: Secondary | ICD-10-CM

## 2019-06-23 MED ORDER — LOSARTAN POTASSIUM 50 MG PO TABS: 50.0000 mg | ORAL_TABLET | ORAL | 2 refills | Status: DC

## 2019-06-23 NOTE — Progress Notes (Signed)
Primary Physician/Referring:  Jani Gravel, MD  Patient ID: [name], female    DOB: 09-09-1941, 78 y.o.   MRN: [id number]  Chief Complaint  Patient presents with  . Aortic Stenosis  . PAD  . Follow-up    7 month  . Results   HPI:    [name]  is a 78 y.o. Caucasian female with coronary artery disease and myocardial infarction in 1999 &  2001, left carotid endarterectomy in 2000, left iliac artery stenting and right SFA stenting and left subclavian artery stenting in the remote past and repeat stenting of bilateral external iliac arteries on 11/12/2015 with balloon expandable stent on the right and self-expanding stent on the left. Repeat peripheral arteriogram on 05/10/2017 which revealed widely patent stents.  Other significant past medical history includes hyperglycemia and hypercholesterolemia and unable to tolerate high dose statins and is presently enrolled in Esperion trial with Bempedoic acid.  She has been having severe back pain and leg pain especially the right leg and right hip, after my examination last time 6 months ago, I recommended that she be evaluated for orthopedic and also neurogenic claudication.  She is now getting steroid injection with improvement in symptoms.  She complains of burning sensation and discomfort in shin area of the right leg and is wondering if this is related to circulation issues.  She underwent carotid artery duplex and presents for 42-month follow-up.  Denies chest pain, has mild chronic dyspnea.  Past Medical History:  Diagnosis Date  . Anemia   . Chronic bronchitis ([place])   . Coronary artery disease   . Difficulty swallowing pills   . Difficulty swallowing solids   . GERD (gastroesophageal reflux disease)   . H/O temporal arteritis 08/2014-01/2015  . HA (headache)    "related to temporal arteritis 08/2014-01/2015"  . Hypercholesteremia   . Hypertension   . Hypoxia   . Kidney stones   . Left carotid artery stenosis   .  Myocardial infarct ([place]) 1999; 2001  . On home oxygen therapy    "2L at night; don't always use it" (11/12/2015)  . Pneumonia 1970s X 1; ~ 2006; ~ 2014  . PVD (peripheral vascular disease) ([place])   . Rheumatoid arthritis ([place])   . Syncope and collapse 10/05/2015   Past Surgical History:  Procedure Laterality Date  . CAROTID ANGIOGRAM N/A 10/10/2013   Procedure: CAROTID ANGIOGRAM;  Surgeon: Laverda Page, MD;  Location: [facility] CATH LAB;  Service: Cardiovascular;  Laterality: N/A;  . CAROTID ENDARTERECTOMY Left 2000  . CATARACT EXTRACTION W/ INTRAOCULAR LENS  IMPLANT, BILATERAL Bilateral   . CORONARY ANGIOPLASTY WITH STENT PLACEMENT  1999  . CYSTOSCOPY  X 2   "after I passed kidney stones"  . DILATION AND CURETTAGE OF UTERUS    . FEMORAL ARTERY STENT Right 2009   SFA  . ILIAC ARTERY STENT Left   . ILIAC ARTERY STENT Bilateral 11/12/2015   "left external; right lower iliac"  . LOWER EXTREMITY ANGIOGRAPHY N/A 04/13/2017   Procedure: Lower Extremity Angiography;  Surgeon: Adrian Prows, MD;  Location: [facility] CV LAB;  Service: Cardiovascular;  Laterality: N/A;  . PERIPHERAL VASCULAR CATHETERIZATION N/A 11/12/2015   Procedure: Lower Extremity Angiography;  Surgeon: Adrian Prows, MD;  Location: [place] [facility] CV LAB;  Service: Cardiovascular;  Laterality: N/A;  . SUBCLAVIAN ARTERY STENT Left 2000   Possible same time Right SFA stent/notes 11/10/2015  . TUBAL LIGATION    . YAG LASER APPLICATION Left    "  for floaters"   Social History   Socioeconomic History  . Marital status: Widowed    Spouse name: Not on file  . Number of children: 3  . Years of education: college  . Highest education level: Not on file  Occupational History    Comment: Retired Animal nutritionist  . Financial resource strain: Not on file  . Food insecurity    Worry: Not on file    Inability: Not on file  . Transportation needs    Medical: Not on file    Non-medical: Not on file  Tobacco Use  . Smoking status: Former  Smoker    Packs/day: 2.00    Years: 42.00    Pack years: 84.00    Types: Cigarettes    Quit date: 2001    Years since quitting: 19.7  . Smokeless tobacco: Never Used  . Tobacco comment: Quit in 2000  Substance and Sexual Activity  . Alcohol use: No    Alcohol/week: 0.0 standard drinks  . Drug use: No  . Sexual activity: Not on file  Lifestyle  . Physical activity    Days per week: Not on file    Minutes per session: Not on file  . Stress: Not on file  Relationships  . Social Herbalist on phone: Not on file    Gets together: Not on file    Attends religious service: Not on file    Active member of club or organization: Not on file    Attends meetings of clubs or organizations: Not on file    Relationship status: Not on file  . Intimate partner violence    Fear of current or ex partner: Not on file    Emotionally abused: Not on file    Physically abused: Not on file    Forced sexual activity: Not on file  Other Topics Concern  . Not on file  Social History Narrative   Patient lives at home with her son and she takes care of him- Widowed.   Retired.    Education college Retired. RN.   Right handed.   Caffeine One pot of coffee daily. Sometimes coke cola.   ROS  Review of Systems  Constitution: Negative for chills, decreased appetite, malaise/fatigue and weight gain.  Cardiovascular: Positive for dyspnea on exertion. Negative for claudication, leg swelling, orthopnea and syncope.  Endocrine: Negative for cold intolerance.  Hematologic/Lymphatic: Does not bruise/bleed easily.  Musculoskeletal: Positive for back pain and joint pain. Negative for joint swelling.  Gastrointestinal: Negative for abdominal pain, anorexia, change in bowel habit, hematochezia and melena.  Neurological: Negative for headaches and light-headedness.  Psychiatric/Behavioral: Negative for depression and substance abuse.  All other systems reviewed and are negative.  Objective   Vitals  with BMI 06/23/2019 06/23/2019 04/13/2017  Height - 5\' 4"  -  Weight - 148 lbs -  BMI - 123456 -  Systolic [id number]  Diastolic 78 69 67  Pulse 70 67 70    Blood pressure (!) 161/78, pulse 70, temperature 98 F (36.7 C), height 5\' 4"  (1.626 m), weight 148 lb (67.1 kg), SpO2 99 %. Body mass index is 25.4 kg/m.   Physical Exam  Constitutional: She appears well-developed and well-nourished. No distress.  HENT:  Head: Atraumatic.  Eyes: Conjunctivae are normal.  Neck: Neck supple. No JVD present. No thyromegaly present.  Cardiovascular: Normal rate, regular rhythm, normal heart sounds, intact distal pulses and normal pulses. Exam reveals no gallop.  No  murmur heard. Pulses:      Carotid pulses are 2+ on the right side with bruit and 2+ on the left side with bruit. Pulmonary/Chest: Effort normal and breath sounds normal.  Abdominal: Soft. Bowel sounds are normal.  Musculoskeletal: Normal range of motion.  Neurological: She is alert.  Skin: Skin is warm and dry.  Psychiatric: She has a normal mood and affect.   Radiology: No results found.  Laboratory examination:   No results for input(s): NA, K, CL, CO2, GLUCOSE, BUN, CREATININE, CALCIUM, GFRNONAA, GFRAA in the last 8760 hours. CMP 05/16/2008  Glucose 120(H)  BUN 9  Creatinine 0.74  Sodium 138  Potassium 4.5 HEMOLYZED SPECIMEN, RESULTS MAY BE AFFECTED  Chloride 107  CO2 25  Calcium 9.0   CBC 05/16/2008  WBC 6.9  Hemoglobin 12.6  Hematocrit 37.1  Platelets 327   Lipid Panel  No results found for: CHOL, TRIG, HDL, CHOLHDL, VLDL, LDLCALC, LDLDIRECT HEMOGLOBIN A1C No results found for: HGBA1C, MPG TSH No results for input(s): TSH in the last 8760 hours. Medications   Prior to Admission medications   Medication Sig Start Date End Date Taking? Authorizing Provider  aspirin 81 MG tablet Take 162 mg by mouth daily.    Yes [provider]  bisoprolol (ZEBETA) 5 MG tablet Take 5 mg by mouth daily.    Yes [provider]  cetirizine (ZYRTEC) 10 MG tablet Take 10 mg by mouth daily as needed for allergies.   Yes [provider]  Cholecalciferol (VITAMIN D) 2000 UNITS CAPS Take 2,000 Units by mouth daily.   Yes [provider]  Coenzyme Q10 (COQ10) 200 MG CAPS Take 1 capsule by mouth daily.   Yes [provider]  Investigational - Study Medication Take 1 tablet by mouth every morning. Study name:  Cholesterol Med  Additional study details:   Yes [provider]  pantoprazole (PROTONIX) 40 MG tablet Take 40 mg by mouth daily.   Yes [provider]  predniSONE (DELTASONE) 5 MG tablet Take 5 mg by mouth daily with breakfast.   Yes [provider]  Probiotic Product (PROBIOTIC DAILY PO) Take 1 capsule by mouth daily.   Yes [provider]  Vitamin D, Ergocalciferol, (DRISDOL) 50000 UNITS CAPS capsule Take 50,000 Units by mouth every 7 (seven) days.   Yes [provider]     Current Outpatient Medications  Medication Instructions  . aspirin 162 mg, Daily  . bisoprolol (ZEBETA) 5 mg, Oral, Daily  . cetirizine (ZYRTEC) 10 mg, Daily PRN  . Coenzyme Q10 (COQ10) 200 MG CAPS 1 capsule, Oral, Daily  . Investigational - Study Medication 1 tablet, Oral, BH-each morning, Study name:  Cholesterol Med Additional study details:   . losartan (COZAAR) 50 mg, Oral, BH-each morning  . pantoprazole (PROTONIX) 40 mg, Oral, Daily  . predniSONE (DELTASONE) 5 mg, Oral, Daily with breakfast  . Probiotic Product (PROBIOTIC DAILY PO) 1 capsule, Oral, Daily  . Vitamin D (Ergocalciferol) (DRISDOL) 50,000 Units, Oral, Every 7 days  . Vitamin D 2,000 Units, Daily    Cardiac Studies:    Coronary Angiography MI in 1999; Stent in right coronary artery in 1999; MI in 2001; Patent stent at that time  Treadmill stress test [05/11/2014]: Indications: Diagnosis of Coronary Artery Disease. Conclusions: Negative for ischemia. The patient exercised according to  the Bruce protocol, Total time recorded 4 Min. 22 sec. achieving a max heart rate of 128 which was 85% of MPHR for age and 7.3 METS  of work. Hypertensive. Baseline NIBP was 130/90. Peak NIBP was 162/102 MaxSysp was: 162 MaxDiasp was: 102. The baseline ECG showed NSR,Non specific ST depression. During exercise there was No additional ST segment depressions in the Inferior and lateral leads. Symptoms: Claudication right leg. Achieved 85% MPHR. Arrhythmia: Occasional PAC. Continue primary prevention. Eval for claudication.  Echocardiogram [08/27/2015]: 1. Left ventricle cavity is normal in size. Normal global wall motion. Visual EF is 55-60%. Doppler evidence of grade I (impaired) diastolic dysfunction. Calculated EF 55%. 2. Left atrial cavity is at upper limits of normal in size. 3. Trace aortic regurgitation. 4. Mild to moderate mitral regurgitation. 5. Mild tricuspid regurgitation. No evidence of pulmonary Hypertension.  Lower extremity arterial duplex 03/26/2016: No hemodynamically significant stenoses are identified in the bilateral lower extremity arterial system. This exam reveals mildly decreased perfusion of the right lower extremity, with ABI 1.02 and left lower extremity with ABI 0.99. Study suggests patent right SFA and left iliac stent. No significant change compared to 03/27/2015. Impression: Peripheral arteriogram 04/13/2017 Patent stents placed 11/12/2015- RCIA 9x19 mm Omnilink Elite balloon expandable stent, Left External iliac 35mm CSI atherectomy followed by 9x60 mm self expanding Absolute Pro stent. Right LE arteriogram: No significant disease.  Abdominal aortic duplex 12/30/2016: Diffuse plaque noted in the proximal, mid and distal aorta. Normal iliac velocity. No AAA observed.  Peripheral arteriogram 04/13/2017 Patent stents placed 11/12/2015- RCIA 9x19 mm Omnilink Elite balloon expandable stent, Left External iliac 61mm CSI atherectomy followed by 9x60 mm self expanding Absolute  Pro stent. Right LE arteriogram: No significant disease.  Carotid artery duplex  05/18/2019: Stenosis in the right internal carotid artery (16-49%). Stenosis in the left internal carotid artery (>=70%). Mild stenosis in the left common carotid artery (<50%). The left PSV internal/common carotid artery ratio of 5.19 is consistent with a stenosis of >70%. Antegrade right vertebral artery flow. Antegrade left vertebral artery flow. Compared to 05/17/2018, there is mild progression in left ICA stenosis. Follow up in six months is appropriate if clinically indicated.  Assessment     ICD-10-CM   1. Bilateral carotid artery stenosis  I65.23 EKG 12-Lead  2. Essential hypertension  I10 losartan (COZAAR) 50 MG tablet  3. PAD (peripheral artery disease) (HCC)  I73.9   4. Coronary artery disease involving native coronary artery of native heart without angina pectoris  I25.10     MI in 1999; Stent in right coronary artery in 1999; MI in 2001; Patent stent at that time  5. Statin intolerance  Z78.9   6. Enrolled in clinical trial of drug  Z00.6    Statin Intolerant Trial with Bempedoic acid "Esperion". Do not check Lipids     EKG 06/23/2019: Sinus rhythm with short PR interval at rate of 64 bpm, normal axis.  No evidence of ischemia.  Normal QT interval.  Recommendations:   Patient with coronary artery disease with remote MI, left carotid endarterectomy in 2000, bilateral iliac artery stenting in the remote past with repeat stenting on 11/12/2015, hyperglycemia, hypertension, statin intolerance and presently in Esperion trial with Bempedoic acid presents here for 45-month office visit.  She complains of right leg specifically pain around the right shin area.  Clearly no change in physical examination compared to prior examination 6 months ago and I do not suspect she is got worsening PAD to explain a focal continued continuous pain in the right shin area.  Advised her to try vitamin B6 and also B12 for  neuropathy.  She is being treated for spinal  stenosis and also right hip arthritis.  With regard to carotid artery stenosis, there is slight progression of left carotid stenosis.  We will continue surveillance of the same.  She is in a statin intolerant trial hence lipids are not being checked according to research protocol.  Patient would like to continue participating.  With regard to CAD, she has not had any recurrence of angina pectoris.  Her blood pressure was elevated today, previously well controlled on losartan and it was switched to bisoprolol in view of tachycardia, I will restart losartan 50 mg daily which she has tolerated before.  She has an appointment to see Dr. Jani Gravel in about a month, blood pressure can be reassessed then.  Although blood pressure elevated today, patient very concerned that she does drop her blood pressure at home.  I will see him back in 6 months or sooner if problems.  Adrian Prows, MD, [facility] 06/23/2019, 5:43 PM [facility]. [place] Pager: [phone] Office: [phone] If no answer Cell [phone]

## 2019-06-30 DIAGNOSIS — Z23 Encounter for immunization: Secondary | ICD-10-CM | POA: Diagnosis not present

## 2019-07-03 DIAGNOSIS — H5211 Myopia, right eye: Secondary | ICD-10-CM | POA: Diagnosis not present

## 2019-07-03 DIAGNOSIS — H26492 Other secondary cataract, left eye: Secondary | ICD-10-CM | POA: Diagnosis not present

## 2019-07-03 DIAGNOSIS — H43813 Vitreous degeneration, bilateral: Secondary | ICD-10-CM | POA: Diagnosis not present

## 2019-07-03 DIAGNOSIS — H3500 Unspecified background retinopathy: Secondary | ICD-10-CM | POA: Diagnosis not present

## 2019-07-25 DIAGNOSIS — I10 Essential (primary) hypertension: Secondary | ICD-10-CM | POA: Diagnosis not present

## 2019-07-25 DIAGNOSIS — E785 Hyperlipidemia, unspecified: Secondary | ICD-10-CM | POA: Diagnosis not present

## 2019-07-25 DIAGNOSIS — E559 Vitamin D deficiency, unspecified: Secondary | ICD-10-CM | POA: Diagnosis not present

## 2019-07-28 ENCOUNTER — Other Ambulatory Visit: Payer: Self-pay

## 2019-07-28 ENCOUNTER — Ambulatory Visit
Admission: RE | Admit: 2019-07-28 | Discharge: 2019-07-28 | Disposition: A | Discharge status: Home / Self Care | Payer: Medicare Other | Source: Ambulatory Visit | Attending: Internal Medicine | Admitting: Internal Medicine

## 2019-07-28 DIAGNOSIS — Z1231 Encounter for screening mammogram for malignant neoplasm of breast: Secondary | ICD-10-CM

## 2019-08-01 DIAGNOSIS — E559 Vitamin D deficiency, unspecified: Secondary | ICD-10-CM | POA: Diagnosis not present

## 2019-08-01 DIAGNOSIS — I2510 Atherosclerotic heart disease of native coronary artery without angina pectoris: Secondary | ICD-10-CM | POA: Diagnosis not present

## 2019-08-01 DIAGNOSIS — Z87891 Personal history of nicotine dependence: Secondary | ICD-10-CM | POA: Diagnosis not present

## 2019-08-01 DIAGNOSIS — E7800 Pure hypercholesterolemia, unspecified: Secondary | ICD-10-CM | POA: Diagnosis not present

## 2019-08-01 DIAGNOSIS — M8589 Other specified disorders of bone density and structure, multiple sites: Secondary | ICD-10-CM | POA: Diagnosis not present

## 2019-08-01 DIAGNOSIS — I6529 Occlusion and stenosis of unspecified carotid artery: Secondary | ICD-10-CM | POA: Diagnosis not present

## 2019-08-01 DIAGNOSIS — M7060 Trochanteric bursitis, unspecified hip: Secondary | ICD-10-CM | POA: Diagnosis not present

## 2019-08-01 DIAGNOSIS — I739 Peripheral vascular disease, unspecified: Secondary | ICD-10-CM | POA: Diagnosis not present

## 2019-08-01 DIAGNOSIS — I10 Essential (primary) hypertension: Secondary | ICD-10-CM | POA: Diagnosis not present

## 2019-08-01 DIAGNOSIS — M5416 Radiculopathy, lumbar region: Secondary | ICD-10-CM | POA: Diagnosis not present

## 2019-08-01 DIAGNOSIS — Z0000 Encounter for general adult medical examination without abnormal findings: Secondary | ICD-10-CM | POA: Diagnosis not present

## 2019-08-01 DIAGNOSIS — M1990 Unspecified osteoarthritis, unspecified site: Secondary | ICD-10-CM | POA: Diagnosis not present

## 2019-08-01 DIAGNOSIS — M316 Other giant cell arteritis: Secondary | ICD-10-CM | POA: Diagnosis not present

## 2019-08-03 DIAGNOSIS — I2510 Atherosclerotic heart disease of native coronary artery without angina pectoris: Secondary | ICD-10-CM | POA: Diagnosis not present

## 2019-08-03 DIAGNOSIS — I739 Peripheral vascular disease, unspecified: Secondary | ICD-10-CM | POA: Diagnosis not present

## 2019-08-03 DIAGNOSIS — E7800 Pure hypercholesterolemia, unspecified: Secondary | ICD-10-CM | POA: Diagnosis not present

## 2019-08-10 MED FILL — REPATHA SURECLICK 140 MG/ML: 140 | 28 days supply | Qty: 2 | Fill #0

## 2019-09-27 DIAGNOSIS — M48062 Spinal stenosis, lumbar region with neurogenic claudication: Secondary | ICD-10-CM | POA: Diagnosis not present

## 2019-10-23 DIAGNOSIS — I10 Essential (primary) hypertension: Secondary | ICD-10-CM | POA: Diagnosis not present

## 2019-11-01 DIAGNOSIS — E559 Vitamin D deficiency, unspecified: Secondary | ICD-10-CM | POA: Diagnosis not present

## 2019-11-01 DIAGNOSIS — I10 Essential (primary) hypertension: Secondary | ICD-10-CM | POA: Diagnosis not present

## 2019-11-01 DIAGNOSIS — Z780 Asymptomatic menopausal state: Secondary | ICD-10-CM | POA: Diagnosis not present

## 2019-11-01 DIAGNOSIS — E7800 Pure hypercholesterolemia, unspecified: Secondary | ICD-10-CM | POA: Diagnosis not present

## 2019-11-13 ENCOUNTER — Other Ambulatory Visit: Payer: Self-pay | Admitting: Orthopedic Surgery

## 2019-11-13 ENCOUNTER — Telehealth: Payer: Self-pay

## 2019-11-13 DIAGNOSIS — M545 Low back pain: Secondary | ICD-10-CM | POA: Diagnosis not present

## 2019-11-13 DIAGNOSIS — I219 Acute myocardial infarction, unspecified: Secondary | ICD-10-CM | POA: Insufficient documentation

## 2019-11-13 DIAGNOSIS — M5416 Radiculopathy, lumbar region: Secondary | ICD-10-CM | POA: Insufficient documentation

## 2019-11-13 DIAGNOSIS — E7800 Pure hypercholesterolemia, unspecified: Secondary | ICD-10-CM | POA: Insufficient documentation

## 2019-11-13 DIAGNOSIS — K219 Gastro-esophageal reflux disease without esophagitis: Secondary | ICD-10-CM | POA: Insufficient documentation

## 2019-11-13 DIAGNOSIS — G8929 Other chronic pain: Secondary | ICD-10-CM

## 2019-11-13 DIAGNOSIS — M4316 Spondylolisthesis, lumbar region: Secondary | ICD-10-CM | POA: Diagnosis not present

## 2019-11-13 DIAGNOSIS — M5136 Other intervertebral disc degeneration, lumbar region: Secondary | ICD-10-CM | POA: Diagnosis not present

## 2019-11-13 DIAGNOSIS — M51369 Other intervertebral disc degeneration, lumbar region without mention of lumbar back pain or lower extremity pain: Secondary | ICD-10-CM | POA: Insufficient documentation

## 2019-11-13 DIAGNOSIS — E531 Pyridoxine deficiency: Secondary | ICD-10-CM | POA: Insufficient documentation

## 2019-11-13 DIAGNOSIS — I519 Heart disease, unspecified: Secondary | ICD-10-CM | POA: Insufficient documentation

## 2019-11-13 DIAGNOSIS — M790 Rheumatism, unspecified: Secondary | ICD-10-CM | POA: Insufficient documentation

## 2019-11-13 NOTE — Telephone Encounter (Signed)
Phone call to patient to verify medication list and allergies for myelogram procedure. Medications pt is currently taking are safe to continue to take. Advised pt if any new medications are started prior to procedure to call and make [place] aware. Pt does have allergy to contrast dye, will call in a 13 hour prep for pt. Pt also instructed to have a driver the day of the procedure, that she would be with [place] around 2 hours, and that she would need to lay flat for at least 24 hours post procedure. Discharge instructions reviewed with pt. Pt verbalized understanding.

## 2019-11-13 NOTE — Telephone Encounter (Signed)
Phone call to patient to review instructions for 13 hr prep for Myelogram w/ contrast on 11/16/2019  at 930 AM. Prescription called into [facility]. Pt aware and verbalized understanding of instructions. Prescription: 11/15/19 830 PM- 50mg  Prednisone 11/16/19 230 AM- 50mg  Prednisone 11/16/19 830 AM - 50mg  Prednisone and 50mg  Benadryl

## 2019-11-16 ENCOUNTER — Other Ambulatory Visit: Payer: Self-pay

## 2019-11-16 ENCOUNTER — Ambulatory Visit
Admission: RE | Admit: 2019-11-16 | Discharge: 2019-11-16 | Disposition: A | Discharge status: Home / Self Care | Payer: Medicare Other | Source: Ambulatory Visit | Attending: Orthopedic Surgery | Admitting: Orthopedic Surgery

## 2019-11-16 DIAGNOSIS — M545 Low back pain, unspecified: Secondary | ICD-10-CM

## 2019-11-16 DIAGNOSIS — G8929 Other chronic pain: Secondary | ICD-10-CM

## 2019-11-16 DIAGNOSIS — M48061 Spinal stenosis, lumbar region without neurogenic claudication: Secondary | ICD-10-CM | POA: Diagnosis not present

## 2019-11-16 MED ORDER — IOPAMIDOL (ISOVUE-M 200) INJECTION 41%: 18.0000 mL | Freq: Once | INTRAMUSCULAR | Status: DC

## 2019-11-16 MED ORDER — DIAZEPAM 5 MG PO TABS
5.0000 mg | ORAL_TABLET | Freq: Once | ORAL | Status: AC
  Administered 2019-11-16: 09:00:00 5 mg via ORAL

## 2019-11-16 NOTE — Discharge Instructions (Signed)

## 2019-11-20 ENCOUNTER — Other Ambulatory Visit: Payer: Self-pay

## 2019-11-20 ENCOUNTER — Ambulatory Visit (INDEPENDENT_AMBULATORY_CARE_PROVIDER_SITE_OTHER): Payer: Medicare Other

## 2019-11-20 DIAGNOSIS — I6523 Occlusion and stenosis of bilateral carotid arteries: Secondary | ICD-10-CM | POA: Diagnosis not present

## 2019-11-25 ENCOUNTER — Other Ambulatory Visit: Payer: Self-pay | Admitting: Cardiology

## 2019-11-25 DIAGNOSIS — I6523 Occlusion and stenosis of bilateral carotid arteries: Secondary | ICD-10-CM

## 2019-11-25 NOTE — Progress Notes (Signed)
Improvement in stenosis severity bilaterally. Will recheck in 6 months

## 2019-11-27 DIAGNOSIS — M5136 Other intervertebral disc degeneration, lumbar region: Secondary | ICD-10-CM | POA: Diagnosis not present

## 2019-12-04 DIAGNOSIS — M5416 Radiculopathy, lumbar region: Secondary | ICD-10-CM | POA: Diagnosis not present

## 2019-12-11 DIAGNOSIS — M5416 Radiculopathy, lumbar region: Secondary | ICD-10-CM | POA: Diagnosis not present

## 2019-12-14 DIAGNOSIS — M5416 Radiculopathy, lumbar region: Secondary | ICD-10-CM | POA: Diagnosis not present

## 2019-12-18 DIAGNOSIS — M5416 Radiculopathy, lumbar region: Secondary | ICD-10-CM | POA: Diagnosis not present

## 2019-12-20 DIAGNOSIS — M5416 Radiculopathy, lumbar region: Secondary | ICD-10-CM | POA: Diagnosis not present

## 2019-12-21 NOTE — Progress Notes (Signed)
Primary Physician/Referring:  Jani Gravel, MD  Patient ID: [name], female    DOB: 11-22-1940, 79 y.o.   MRN: [id number]  Chief Complaint  Patient presents with  . Carotid disease  . Hypertension  . PAD  . Follow-up    6 month    HPI:    [name]  is a 79 y.o. Caucasian female with coronary artery disease and myocardial infarction in 1999 &  2001, left carotid endarterectomy in 2000, left iliac artery stenting and right SFA stenting and left subclavian artery stenting in the remote past and repeat stenting of bilateral external iliac arteries on  11/12/2015 with balloon expandable stent on the right and self-expanding stent on the left. Repeat peripheral arteriogram on 05/10/2017 which revealed widely patent stents. She is now on Repatha and tolerating this well, started this sometime in September 2020.  Labs are pending next month.  Other significant past medical history includes hyperglycemia and hypercholesterolemia and unable to tolerate high dose statins and is presently enrolled in Esperion trial with Bempedoic acid.  Chronic back pain and sciatica and peripheral neuropathy, she continues to have severe pain in her right hip radiating down to her leg, present at rest, has been evaluated by orthopedics and recommended surgery due to spinal compression.  Due to her scheduling she is now on physical therapy.  She is now on Repatha and tolerating this well, started this sometime in September 2020.  Labs are pending next month.  Past Medical History:  Diagnosis Date  . Anemia   . Chronic bronchitis ([place])   . Coronary artery disease   . Difficulty swallowing pills   . Difficulty swallowing solids   . GERD (gastroesophageal reflux disease)   . H/O temporal arteritis 08/2014-01/2015  . HA (headache)    "related to temporal arteritis 08/2014-01/2015"  . Hypercholesteremia   . Hypertension   . Hypoxia   . Kidney stones   . Left carotid artery stenosis   . Myocardial  infarct ([place]) 1999; 2001  . On home oxygen therapy    "2L at night; don't always use it" (11/12/2015)  . Pneumonia 1970s X 1; ~ 2006; ~ 2014  . PVD (peripheral vascular disease) ([place])   . Rheumatoid arthritis ([place])   . Syncope and collapse 10/05/2015   Past Surgical History:  Procedure Laterality Date  . CAROTID ANGIOGRAM N/A 10/10/2013   Procedure: CAROTID ANGIOGRAM;  Surgeon: Laverda Page, MD;  Location: [facility] CATH LAB;  Service: Cardiovascular;  Laterality: N/A;  . CAROTID ENDARTERECTOMY Left 2000  . CATARACT EXTRACTION W/ INTRAOCULAR LENS  IMPLANT, BILATERAL Bilateral   . CORONARY ANGIOPLASTY WITH STENT PLACEMENT  1999  . CYSTOSCOPY  X 2   "after I passed kidney stones"  . DILATION AND CURETTAGE OF UTERUS    . FEMORAL ARTERY STENT Right 2009   SFA  . ILIAC ARTERY STENT Left   . ILIAC ARTERY STENT Bilateral 11/12/2015   "left external; right lower iliac"  . LOWER EXTREMITY ANGIOGRAPHY N/A 04/13/2017   Procedure: Lower Extremity Angiography;  Surgeon: Adrian Prows, MD;  Location: [place] CV LAB;  Service: Cardiovascular;  Laterality: N/A;  . PERIPHERAL VASCULAR CATHETERIZATION N/A 11/12/2015   Procedure: Lower Extremity Angiography;  Surgeon: Adrian Prows, MD;  Location: [place] [facility] LAB;  Service: Cardiovascular;  Laterality: N/A;  . SUBCLAVIAN ARTERY STENT Left 2000   Possible same time Right SFA stent/notes 11/10/2015  . TUBAL LIGATION    . YAG LASER APPLICATION Left    "  for floaters"   Family History  Problem Relation Age of Onset  . Diabetes Mother   . Hypertension Mother   . Breast cancer Sister   . Heart attack Father     Social History   Tobacco Use  . Smoking status: Former Smoker    Packs/day: 2.00    Years: 42.00    Pack years: 84.00    Types: Cigarettes    Quit date: 2001    Years since quitting: 20.2  . Smokeless tobacco: Never Used  . Tobacco comment: Quit in 2000  Substance Use Topics  . Alcohol use: No    Alcohol/week: 0.0 standard drinks   ROS    Review of Systems  Cardiovascular: Positive for dyspnea on exertion. Negative for chest pain and leg swelling.  Musculoskeletal: Positive for arthritis, back pain and muscle cramps.  Gastrointestinal: Negative for melena.   Objective  Blood pressure (!) 122/55, pulse 74, temperature (!) 97.5 F (36.4 C), temperature source Temporal, resp. rate 16, height 5\' 4"  (1.626 m), weight 152 lb 12.8 oz (69.3 kg), SpO2 100 %.  Vitals with BMI 12/22/2019 12/22/2019 11/16/2019  Height - 5\' 4"  -  Weight - 152 lbs 13 oz -  BMI - [id number] -  Systolic [id number]  Diastolic 55 50 80  Pulse 74 75 72     Physical Exam  Cardiovascular: Normal rate, regular rhythm, normal heart sounds and intact distal pulses. Exam reveals no gallop.  No murmur heard. Pulses:      Carotid pulses are on the right side with bruit and on the left side with bruit.      Femoral pulses are 2+ on the right side with bruit and 2+ on the left side with bruit.      Popliteal pulses are 0 on the right side and 0 on the left side.       Dorsalis pedis pulses are 1+ on the right side and 1+ on the left side.       Posterior tibial pulses are 1+ on the right side and 0 on the left side.  No leg edema, no JVD.  Pulmonary/Chest: Effort normal and breath sounds normal.  Abdominal: Soft. Bowel sounds are normal.   Laboratory examination:   No results for input(s): NA, K, CL, CO2, GLUCOSE, BUN, CREATININE, CALCIUM, GFRNONAA, GFRAA in the last 8760 hours. CrCl cannot be calculated (Patient's most recent lab result is older than the maximum 21 days allowed.).  CMP 05/16/2008  Glucose 120(H)  BUN 9  Creatinine 0.74  Sodium 138  Potassium 4.5 HEMOLYZED SPECIMEN, RESULTS MAY BE AFFECTED  Chloride 107  CO2 25  Calcium 9.0   CBC 05/16/2008  WBC 6.9  Hemoglobin 12.6  Hematocrit 37.1  Platelets 327   Lipid Panel  No results found for: CHOL, TRIG, HDL, CHOLHDL, VLDL, LDLCALC, LDLDIRECT HEMOGLOBIN A1C No results found for: HGBA1C,  MPG TSH No results for input(s): TSH in the last 8760 hours.  External labs:   Cholesterol, total 172.000 M 10/23/2019 HDL 74.000 M 10/23/2019 LDL 121 Triglycerides 114.000 10/23/2019  Hemoglobin 11.200 G/ 06/16/2018  Creatinine, Serum 1.140 MG/ 10/23/2019 Potassium 4.400 MM 12/05/2015 ALT (SGPT) 15.000 IU/ 10/23/2019  Medications and allergies   Allergies  Allergen Reactions  . Sulfa Antibiotics Anaphylaxis  . Crestor [Rosuvastatin] Cough    "deathly ill"  . Ibuprofen Nausea And Vomiting  . Ivp Dye [Iodinated Diagnostic Agents] Nausea And Vomiting and Other (See Comments)    "I may have had  a rash too" "They normally give me benadryl and steroids before contrast dye"  . Lipitor [Atorvastatin] Other (See Comments)    Legs hurt so bad she couldn't walk  . Macrodantin Nausea And Vomiting    faint  . Naprosyn [Naproxen] Nausea Only and Other (See Comments)    faint  . Niaspan [Niacin Er] Other (See Comments)    Flushing, headache  . Vioxx [Rofecoxib] Other (See Comments)    "deathly ill"  . Zetia [Ezetimibe] Other (See Comments)    Deathly ill   . Meloxicam     Can't remember  . Ciprofloxacin Rash  . Neosporin [Neomycin-Bacitracin Zn-Polymyx] Rash  . Phenergan [Promethazine Hcl] Rash  . Pletal [Cilostazol] Other (See Comments)    headache  . Ramipril Cough    Current Outpatient Medications  Medication Instructions  . aspirin 162 mg, Daily  . bisoprolol (ZEBETA) 5 mg, Oral, Daily  . cetirizine (ZYRTEC) 10 mg, Daily PRN  . Coenzyme Q10 (COQ10) 200 MG CAPS 1 capsule, Oral, Daily  . Investigational - Study Medication 1 tablet, Oral, BH-each morning, Study name:  Cholesterol Med Additional study details:   . losartan (COZAAR) 12.5 mg, Oral, Daily, 1/2 tablet daily   . pantoprazole (PROTONIX) 40 mg, Oral, Daily  . predniSONE (DELTASONE) 5 mg, Oral, Every other day  . Probiotic Product (PROBIOTIC DAILY PO) 1 capsule, Oral, Daily  . REPATHA SURECLICK [id number] MG/ML SOAJ 1 Syringe,  Subcutaneous, Every 14 days  . Vitamin D (Ergocalciferol) (DRISDOL) 50,000 Units, Oral, Every 7 days  . Vitamin D 2,000 Units, Daily   Radiology:   No results found.  Cardiac Studies:   Coronary Angiography MI in 1999; Stent in right coronary artery in 1999; MI in 2001; Patent stent at that time  Treadmill stress test [05/11/2014]: Indications: Diagnosis of Coronary Artery Disease. Conclusions: Negative for ischemia. The patient exercised according to the Bruce protocol, Total time recorded 4 Min. 22 sec. achieving a max heart rate of 128 which was 85% of MPHR for age and 7.3 METS of work. Hypertensive. Baseline NIBP was 130/90. Peak NIBP was 162/102 MaxSysp was: 162 MaxDiasp was: 102. The baseline ECG showed NSR,Non specific ST depression. During exercise there was No additional ST segment depressions in the Inferior and lateral leads. Symptoms: Claudication right leg. Achieved 85% MPHR. Arrhythmia: Occasional PAC. Continue primary prevention. Eval for claudication.  Echocardiogram [08/27/2015]: 1. Left ventricle cavity is normal in size. Normal global wall motion. Visual EF is 55-60%. Doppler evidence of grade I (impaired) diastolic dysfunction. Calculated EF 55%. 2. Left atrial cavity is at upper limits of normal in size. 3. Trace aortic regurgitation. 4. Mild to moderate mitral regurgitation. 5. Mild tricuspid regurgitation. No evidence of pulmonary Hypertension.  Lower extremity arterial duplex 03/26/2016: No hemodynamically significant stenoses are identified in the bilateral lower extremity arterial system. This exam reveals mildly decreased perfusion of the right lower extremity, with ABI 1.02 and left lower extremity with ABI 0.99. Study suggests patent right SFA and left iliac stent. No significant change compared to 03/27/2015. Impression: Peripheral arteriogram 04/13/2017 Patent stents placed 11/12/2015- RCIA 9x19 mm Omnilink Elite balloon expandable stent, Left External  iliac 51mm CSI atherectomy followed by 9x60 mm self expanding Absolute Pro stent. Right LE arteriogram: No significant disease.  Abdominal aortic duplex 12/30/2016: Diffuse plaque noted in the proximal, mid and distal aorta. Normal iliac velocity. No AAA observed.  Peripheral arteriogram 04/13/2017 Patent stents placed 11/12/2015- RCIA 9x19 mm Omnilink Elite balloon expandable stent, Left External iliac 40mm  CSI atherectomy followed by 9x60 mm self expanding Absolute Pro stent. Right LE arteriogram: No significant disease.  Carotid artery duplex [id number]:  Peak systolic velocities in the right bifurcation, internal, external and  common carotid arteries are within normal limits. Diffuse heterogeneous  plaque noted.   Doppler velocity suggests stenosis in the left internal carotid artery  (50-69%). Diffuse heterogeneous plaque noted.  Antegrade right vertebral artery flow. Antegrade left vertebral artery  flow.  Follow up in six months is appropriate if clinically indicated. Compared  to 05/18/2019, right ICA <50% and left ICA >70% stenosis. Study suggests  improvement in stenosis range.   EKG  EKG 12/22/2019: Normal sinus rhythm at the rate of 74 bpm, borderline criteria for left atrial enlargement, normal axis.  No evidence of ischemia, normal EKG.  EKG 06/23/2019: Sinus rhythm with short PR interval at rate of 64 bpm, normal axis.  No evidence of ischemia.  Normal QT interval.  Assessment     ICD-10-CM   1. Coronary artery disease involving native coronary artery of native heart without angina pectoris  I25.10   2. Asymptomatic bilateral carotid artery stenosis  I65.23 EKG 12-Lead  3. PAD (peripheral artery disease) (HCC)  I73.9   4. Essential hypertension  I10   5. Hypercholesteremia  E78.00   6. Statin intolerance  Z78.9   7. Enrolled in clinical trial of drug  Z00.6      No orders of the defined types were placed in this encounter.   Medications Discontinued During This  Encounter  Medication Reason  . losartan (COZAAR) 50 MG tablet Change in therapy    Recommendations:   [name]  is a  79 y.o. Caucasian female with coronary artery disease and myocardial infarction in 1999 &  2001, left carotid endarterectomy in 2000, left iliac artery stenting and right SFA stenting and left subclavian artery stenting in the remote past and repeat stenting of bilateral external iliac arteries on  11/12/2015 with balloon expandable stent on the right and self-expanding stent on the left. Repeat peripheral arteriogram on 05/10/2017 which revealed widely patent stents.  She is enrolled in clinical trial for statin intolerance,  "Esperion".  She is now on Repatha and tolerating this well, started this sometime in September 2020.  Labs are pending next month.  She is presently doing well, although she has mild symptoms of claudication involving the right leg, her major pressing issue presently is right leg sciatica with ongoing rest pain.  She is feeling better with physical therapy now.   With regard to carotid artery stenosis, stenosis severity has reduced bilaterally, suspect it may be related to patient having started Repatha.  This may be a protocol deviation but it is best for the patient.  With regard to CAD, she has not had any recurrence of angina pectoris.  Her blood pressure is controlled.  OV in 6 months.  Adrian Prows, MD, [facility] 12/22/2019, 1:49 PM [facility]. [place] Office: [phone]

## 2019-12-22 ENCOUNTER — Ambulatory Visit: Payer: Medicare Other | Admitting: Cardiology

## 2019-12-22 ENCOUNTER — Encounter: Payer: Self-pay | Admitting: Cardiology

## 2019-12-22 ENCOUNTER — Other Ambulatory Visit: Payer: Self-pay

## 2019-12-22 VITALS — BP 122/55 | HR 74 | Temp 97.5°F | Resp 16 | Ht 64.0 in | Wt 152.8 lb

## 2019-12-22 DIAGNOSIS — I10 Essential (primary) hypertension: Secondary | ICD-10-CM

## 2019-12-22 DIAGNOSIS — I2510 Atherosclerotic heart disease of native coronary artery without angina pectoris: Secondary | ICD-10-CM | POA: Diagnosis not present

## 2019-12-22 DIAGNOSIS — Z789 Other specified health status: Secondary | ICD-10-CM

## 2019-12-22 DIAGNOSIS — I6523 Occlusion and stenosis of bilateral carotid arteries: Secondary | ICD-10-CM

## 2019-12-22 DIAGNOSIS — I739 Peripheral vascular disease, unspecified: Secondary | ICD-10-CM | POA: Diagnosis not present

## 2019-12-22 DIAGNOSIS — Z006 Encounter for examination for normal comparison and control in clinical research program: Secondary | ICD-10-CM

## 2019-12-22 DIAGNOSIS — E7800 Pure hypercholesterolemia, unspecified: Secondary | ICD-10-CM

## 2019-12-27 DIAGNOSIS — M5416 Radiculopathy, lumbar region: Secondary | ICD-10-CM | POA: Diagnosis not present

## 2019-12-29 DIAGNOSIS — M5416 Radiculopathy, lumbar region: Secondary | ICD-10-CM | POA: Diagnosis not present

## 2020-01-01 DIAGNOSIS — H04123 Dry eye syndrome of bilateral lacrimal glands: Secondary | ICD-10-CM | POA: Diagnosis not present

## 2020-01-01 DIAGNOSIS — H26492 Other secondary cataract, left eye: Secondary | ICD-10-CM | POA: Diagnosis not present

## 2020-01-01 DIAGNOSIS — H353111 Nonexudative age-related macular degeneration, right eye, early dry stage: Secondary | ICD-10-CM | POA: Diagnosis not present

## 2020-01-01 DIAGNOSIS — H524 Presbyopia: Secondary | ICD-10-CM | POA: Diagnosis not present

## 2020-01-01 DIAGNOSIS — M5416 Radiculopathy, lumbar region: Secondary | ICD-10-CM | POA: Diagnosis not present

## 2020-01-12 DIAGNOSIS — M5416 Radiculopathy, lumbar region: Secondary | ICD-10-CM | POA: Diagnosis not present

## 2020-01-22 DIAGNOSIS — M5416 Radiculopathy, lumbar region: Secondary | ICD-10-CM | POA: Diagnosis not present

## 2020-01-25 DIAGNOSIS — M5416 Radiculopathy, lumbar region: Secondary | ICD-10-CM | POA: Diagnosis not present

## 2020-01-29 DIAGNOSIS — M5416 Radiculopathy, lumbar region: Secondary | ICD-10-CM | POA: Diagnosis not present

## 2020-01-31 DIAGNOSIS — M5416 Radiculopathy, lumbar region: Secondary | ICD-10-CM | POA: Diagnosis not present

## 2020-02-01 DIAGNOSIS — H26492 Other secondary cataract, left eye: Secondary | ICD-10-CM | POA: Diagnosis not present

## 2020-03-23 ENCOUNTER — Ambulatory Visit
Admission: EM | Admit: 2020-03-23 | Discharge: 2020-03-23 | Disposition: A | Discharge status: Home / Self Care | Payer: Medicare Other

## 2020-03-23 DIAGNOSIS — H5712 Ocular pain, left eye: Secondary | ICD-10-CM

## 2020-03-23 NOTE — Discharge Instructions (Signed)
No cut/abrasion to the cornea. No foreign body seen. Artificial tear gel (genteal, systane) 4 times a day, more if needed. Can put in the fridge to help with symptoms. Monitor for any worsening of symptoms, changes in vision, sensitivity to light, eye swelling, painful eye movement, follow up with ophthalmology/emergency department for further evaluation. Otherwise, follow up with eye doctor on monday if symptoms not improving.

## 2020-03-23 NOTE — ED Provider Notes (Signed)
[facility]    CSN: [id number] Arrival date & time: 03/23/20  1209      History   Chief Complaint Chief Complaint  Patient presents with  . Eye Problem    HPI PAISLIE TESSLER is a 79 y.o. female.   79 year old female comes in for 2 day history of left eye pain. She was working in the yard yesterday, and one of the plant leaves hit her in the left eye. Had foreign body sensation and pain after incident. Used artificial tears without obvious foreign body or relief.  Denies any vision changes.  Mild photophobia.  Still with watery eyes, pain, foreign body sensation, and therefore came in for evaluation.  Patient states 6 weeks ago, had laser procedure for clouded lens implant.      Past Medical History:  Diagnosis Date  . Anemia   . Chronic bronchitis ([place])   . Coronary artery disease   . Difficulty swallowing pills   . Difficulty swallowing solids   . GERD (gastroesophageal reflux disease)   . H/O temporal arteritis 08/2014-01/2015  . HA (headache)    "related to temporal arteritis 08/2014-01/2015"  . Hypercholesteremia   . Hypertension   . Hypoxia   . Kidney stones   . Left carotid artery stenosis   . Myocardial infarct ([place]) 1999; 2001  . On home oxygen therapy    "2L at night; don't always use it" (11/12/2015)  . Pneumonia 1970s X 1; ~ 2006; ~ 2014  . PVD (peripheral vascular disease) ([place])   . Rheumatoid arthritis ([place])   . Syncope and collapse 10/05/2015    Patient Active Problem List   Diagnosis Date Noted  . PAD (peripheral artery disease) ([place]) 11/12/2015  . Claudication ([place]) 11/12/2015  . Claudication in peripheral vascular disease ([place]) 10/07/2015  . Right hip pain 04/10/2015  . HA (headache)     Past Surgical History:  Procedure Laterality Date  . CAROTID ANGIOGRAM N/A 10/10/2013   Procedure: CAROTID ANGIOGRAM;  Surgeon: Laverda Page, MD;  Location: [facility] CATH LAB;  Service: Cardiovascular;  Laterality: N/A;  . CAROTID ENDARTERECTOMY  Left 2000  . CATARACT EXTRACTION W/ INTRAOCULAR LENS  IMPLANT, BILATERAL Bilateral   . CORONARY ANGIOPLASTY WITH STENT PLACEMENT  1999  . CYSTOSCOPY  X 2   "after I passed kidney stones"  . DILATION AND CURETTAGE OF UTERUS    . FEMORAL ARTERY STENT Right 2009   SFA  . ILIAC ARTERY STENT Left   . ILIAC ARTERY STENT Bilateral 11/12/2015   "left external; right lower iliac"  . LOWER EXTREMITY ANGIOGRAPHY N/A 04/13/2017   Procedure: Lower Extremity Angiography;  Surgeon: Adrian Prows, MD;  Location: [place] [facility] LAB;  Service: Cardiovascular;  Laterality: N/A;  . PERIPHERAL VASCULAR CATHETERIZATION N/A 11/12/2015   Procedure: Lower Extremity Angiography;  Surgeon: Adrian Prows, MD;  Location: [place] [facility] LAB;  Service: Cardiovascular;  Laterality: N/A;  . SUBCLAVIAN ARTERY STENT Left 2000   Possible same time Right SFA stent/notes 11/10/2015  . TUBAL LIGATION    . YAG LASER APPLICATION Left    "for floaters"    OB History   No obstetric history on file.      Home Medications    Prior to Admission medications   Medication Sig Start Date End Date Taking? Authorizing Provider  aspirin 81 MG tablet Take 162 mg by mouth daily.     [provider]  bisoprolol (ZEBETA) 5 MG tablet Take 5 mg by mouth  daily.     [provider]  cetirizine (ZYRTEC) 10 MG tablet Take 10 mg by mouth daily as needed for allergies.    [provider]  Cholecalciferol (VITAMIN D) 2000 UNITS CAPS Take 2,000 Units by mouth daily.    [provider]  Coenzyme Q10 (COQ10) 200 MG CAPS Take 1 capsule by mouth daily.    [provider]  Investigational - Study Medication Take 1 tablet by mouth every morning. Study name:  Cholesterol Med  Additional study details:    [provider]  losartan (COZAAR) 25 MG tablet Take 12.5 mg by mouth daily. 1/2 tablet daily    [provider]  pantoprazole (PROTONIX) 40 MG tablet Take 40 mg by mouth daily.    [provider]  predniSONE (DELTASONE) 5 MG tablet Take 5 mg by mouth every other day.     [provider]  Probiotic Product (PROBIOTIC DAILY PO) Take 1 capsule by mouth daily.    [provider]  REPATHA SURECLICK 956 MG/ML SOAJ Inject 1 Syringe into the skin every 14 (fourteen) days. 12/12/19   [provider]  Vitamin D, Ergocalciferol, (DRISDOL) 50000 UNITS CAPS capsule Take 50,000 Units by mouth every 7 (seven) days.    [provider]    Family History Family History  Problem Relation Age of Onset  . Diabetes Mother   . Hypertension Mother   . Breast cancer Sister   . Heart attack Father     Social History Social History   Tobacco Use  . Smoking status: Former Smoker    Packs/day: 2.00    Years: 42.00    Pack years: 84.00    Types: Cigarettes    Quit date: 2001    Years since quitting: 20.4  . Smokeless tobacco: Never Used  . Tobacco comment: Quit in 2000  Substance Use Topics  . Alcohol use: No    Alcohol/week: 0.0 standard drinks  . Drug use: No     Allergies   Sulfa antibiotics, Crestor [rosuvastatin], Ibuprofen, Ivp dye [iodinated diagnostic agents], Lipitor [atorvastatin], Macrodantin, Naprosyn [naproxen], Niaspan [niacin er], Vioxx [rofecoxib], Zetia [ezetimibe], Meloxicam, Ciprofloxacin, Neosporin [neomycin-bacitracin zn-polymyx], Phenergan [promethazine hcl], Pletal [cilostazol], and Ramipril   Review of Systems Review of Systems  Reason unable to perform ROS: See HPI as above.     Physical Exam Triage Vital Signs ED Triage Vitals  Enc Vitals Group     BP 03/23/20 1332 (!) 150/75     Pulse Rate 03/23/20 1332 63     Resp 03/23/20 1332 16     Temp 03/23/20 1332 97.8 F (36.6 C)     Temp Source 03/23/20 1332 Oral     SpO2 03/23/20 1332 95 %     Weight --      Height --      Head Circumference --      Peak Flow --      Pain Score 03/23/20 1354 6     Pain Loc --      Pain Edu? --      Excl. in [place]? --    No  data found.  Updated Vital Signs BP (!) 150/75 (BP Location: Right Arm)   Pulse 63   Temp 97.8 F (36.6 C) (Oral)   Resp 16   SpO2 95%   Visual Acuity Right Eye Distance:   Left Eye Distance:   Bilateral Distance:    Right Eye Near: R Near: 20/40 Left Eye Near:  L Near: 20/50 Bilateral Near:     Physical Exam Constitutional:      General: She is not in acute distress.    Appearance: She is well-developed. She is not ill-appearing, toxic-appearing or diaphoretic.  HENT:     Head: Normocephalic and atraumatic.  Eyes:     General: Lids are normal. Lids are everted, no foreign bodies appreciated.     Extraocular Movements: Extraocular movements intact.     Conjunctiva/sclera: Conjunctivae normal.     Pupils: Pupils are equal, round, and reactive to light.     Comments: No photophobia on exam.  No obvious foreign body.  Slight tearing to the left eye. Fluorescein stain without uptake.  Neurological:     Mental Status: She is alert and oriented to person, place, and time.      UC Treatments / Results  Labs (all labs ordered are listed, but only abnormal results are displayed) Labs Reviewed - No data to display  EKG   Radiology No results found.  Procedures Procedures (including critical care time)  Medications Ordered in UC Medications - No data to display  Initial Impression / Assessment and Plan / UC Course  I have reviewed the triage vital signs and the nursing notes.  Pertinent labs & imaging results that were available during my care of the patient were reviewed by me and considered in my medical decision making (see chart for details).    Fluorescein stain without uptake.  Patient with no photophobia on exam, vision at baseline.  No obvious foreign body.  Will treat symptomatically for now, and follow-up with ophthalmology next week for reevaluation if symptoms not improving.  Return precautions given.  Patient expresses understanding and agrees to  plan.  Final Clinical Impressions(s) / UC Diagnoses   Final diagnoses:  Left eye pain    ED Prescriptions    None     PDMP not reviewed this encounter.   Ok Edwards, PA-C 03/23/20 1429

## 2020-03-23 NOTE — ED Triage Notes (Signed)
Patient is here for left eye pain.  She states she was working in the yard yesterday and grass may have gotten in her eye.  Today with watery eyes and pain that feels scratchy.  She notes having laser surgery on the affected eye 6 weeks ago.

## 2020-04-22 DIAGNOSIS — E7800 Pure hypercholesterolemia, unspecified: Secondary | ICD-10-CM | POA: Diagnosis not present

## 2020-04-22 DIAGNOSIS — I10 Essential (primary) hypertension: Secondary | ICD-10-CM | POA: Diagnosis not present

## 2020-04-22 DIAGNOSIS — E559 Vitamin D deficiency, unspecified: Secondary | ICD-10-CM | POA: Diagnosis not present

## 2020-04-30 DIAGNOSIS — M8589 Other specified disorders of bone density and structure, multiple sites: Secondary | ICD-10-CM | POA: Diagnosis not present

## 2020-05-27 DIAGNOSIS — E559 Vitamin D deficiency, unspecified: Secondary | ICD-10-CM | POA: Diagnosis not present

## 2020-05-27 DIAGNOSIS — I2510 Atherosclerotic heart disease of native coronary artery without angina pectoris: Secondary | ICD-10-CM | POA: Diagnosis not present

## 2020-05-27 DIAGNOSIS — I10 Essential (primary) hypertension: Secondary | ICD-10-CM | POA: Diagnosis not present

## 2020-05-27 DIAGNOSIS — N390 Urinary tract infection, site not specified: Secondary | ICD-10-CM | POA: Diagnosis not present

## 2020-05-27 DIAGNOSIS — Z0000 Encounter for general adult medical examination without abnormal findings: Secondary | ICD-10-CM | POA: Diagnosis not present

## 2020-05-27 DIAGNOSIS — E7800 Pure hypercholesterolemia, unspecified: Secondary | ICD-10-CM | POA: Diagnosis not present

## 2020-06-05 DIAGNOSIS — I739 Peripheral vascular disease, unspecified: Secondary | ICD-10-CM | POA: Diagnosis not present

## 2020-06-05 DIAGNOSIS — M19042 Primary osteoarthritis, left hand: Secondary | ICD-10-CM | POA: Diagnosis not present

## 2020-06-05 DIAGNOSIS — I6529 Occlusion and stenosis of unspecified carotid artery: Secondary | ICD-10-CM | POA: Diagnosis not present

## 2020-06-05 DIAGNOSIS — M19031 Primary osteoarthritis, right wrist: Secondary | ICD-10-CM | POA: Diagnosis not present

## 2020-06-05 DIAGNOSIS — M25741 Osteophyte, right hand: Secondary | ICD-10-CM | POA: Diagnosis not present

## 2020-06-05 DIAGNOSIS — I10 Essential (primary) hypertension: Secondary | ICD-10-CM | POA: Diagnosis not present

## 2020-06-05 DIAGNOSIS — M7060 Trochanteric bursitis, unspecified hip: Secondary | ICD-10-CM | POA: Diagnosis not present

## 2020-06-05 DIAGNOSIS — M8589 Other specified disorders of bone density and structure, multiple sites: Secondary | ICD-10-CM | POA: Diagnosis not present

## 2020-06-05 DIAGNOSIS — I2510 Atherosclerotic heart disease of native coronary artery without angina pectoris: Secondary | ICD-10-CM | POA: Diagnosis not present

## 2020-06-05 DIAGNOSIS — M79642 Pain in left hand: Secondary | ICD-10-CM | POA: Diagnosis not present

## 2020-06-05 DIAGNOSIS — M1990 Unspecified osteoarthritis, unspecified site: Secondary | ICD-10-CM | POA: Diagnosis not present

## 2020-06-05 DIAGNOSIS — M316 Other giant cell arteritis: Secondary | ICD-10-CM | POA: Diagnosis not present

## 2020-06-05 DIAGNOSIS — M79641 Pain in right hand: Secondary | ICD-10-CM | POA: Diagnosis not present

## 2020-06-05 DIAGNOSIS — M19041 Primary osteoarthritis, right hand: Secondary | ICD-10-CM | POA: Diagnosis not present

## 2020-06-05 DIAGNOSIS — M5416 Radiculopathy, lumbar region: Secondary | ICD-10-CM | POA: Diagnosis not present

## 2020-06-05 DIAGNOSIS — E785 Hyperlipidemia, unspecified: Secondary | ICD-10-CM | POA: Diagnosis not present

## 2020-06-14 ENCOUNTER — Other Ambulatory Visit: Payer: Medicare Other

## 2020-06-19 ENCOUNTER — Other Ambulatory Visit: Payer: Self-pay

## 2020-06-19 ENCOUNTER — Ambulatory Visit: Payer: Medicare Other

## 2020-06-19 DIAGNOSIS — I6523 Occlusion and stenosis of bilateral carotid arteries: Secondary | ICD-10-CM

## 2020-06-20 ENCOUNTER — Other Ambulatory Visit: Payer: Self-pay | Admitting: Cardiology

## 2020-06-20 DIAGNOSIS — I6523 Occlusion and stenosis of bilateral carotid arteries: Secondary | ICD-10-CM

## 2020-06-24 ENCOUNTER — Other Ambulatory Visit: Payer: Self-pay

## 2020-06-24 ENCOUNTER — Encounter: Payer: Self-pay | Admitting: Cardiology

## 2020-06-24 ENCOUNTER — Ambulatory Visit: Payer: Medicare Other | Admitting: Cardiology

## 2020-06-24 VITALS — BP 136/70 | HR 65 | Ht 64.0 in | Wt 149.6 lb

## 2020-06-24 DIAGNOSIS — I739 Peripheral vascular disease, unspecified: Secondary | ICD-10-CM | POA: Diagnosis not present

## 2020-06-24 DIAGNOSIS — I6523 Occlusion and stenosis of bilateral carotid arteries: Secondary | ICD-10-CM | POA: Diagnosis not present

## 2020-06-24 DIAGNOSIS — I10 Essential (primary) hypertension: Secondary | ICD-10-CM | POA: Diagnosis not present

## 2020-06-24 DIAGNOSIS — E7800 Pure hypercholesterolemia, unspecified: Secondary | ICD-10-CM | POA: Diagnosis not present

## 2020-06-24 DIAGNOSIS — I2510 Atherosclerotic heart disease of native coronary artery without angina pectoris: Secondary | ICD-10-CM

## 2020-06-24 MED ORDER — REPATHA SURECLICK 140 MG/ML ~~LOC~~ SOAJ: 1.0000 | SUBCUTANEOUS | 3 refills | Status: DC

## 2020-06-24 MED ORDER — BISOPROLOL FUMARATE 10 MG PO TABS: 10.0000 mg | ORAL_TABLET | Freq: Every day | ORAL | 3 refills | Status: DC

## 2020-06-24 NOTE — Progress Notes (Signed)
Primary Physician/Referring:  Jani Gravel, MD  Patient ID: [name] , female    DOB: [birth year], 79 y.o.   MRN: [id number]  Chief Complaint  Patient presents with  . Coronary Artery Disease  . PAD  . Follow-up    6 month   HPI:    [name]  is a 79 y.o. Caucasian female with coronary artery disease and myocardial infarction in 1999 &  2001, left carotid endarterectomy in 2000, left iliac artery stenting and right SFA stenting and left subclavian artery stenting in the remote past and repeat stenting of bilateral external iliac arteries on  11/12/2015 with balloon expandable stent on the right and self-expanding stent on the left. Repeat peripheral arteriogram on 05/10/2017 which revealed widely patent stents. She is now on Repatha and tolerating this well, started this sometime in September 2020.  Labs are pending next month.  Presents for 6 month follow up on CAD and PAD.  At last visit patient was tolerating Repatha, with reduction of severity of carotid artery stenosis. She remains on Repatha and tolerating it well, she is also enrolled in  Esperion trial with Bempedoic acid.  She continues to complain of mild symptoms of claudication as well as right leg pain radiating from the hip likely secondary to sciatica.  This has been evaluated by orthopedics and recommended surgery due to spinal compression, however patient preferred to do physical therapy instead.   Denies chest pain, shortness of breath, palpitations, orthopnea, PND.  Past Medical History:  Diagnosis Date  . Anemia   . Chronic bronchitis ([place])   . Coronary artery disease   . Difficulty swallowing pills   . Difficulty swallowing solids   . GERD (gastroesophageal reflux disease)   . H/O temporal arteritis 08/2014-01/2015  . HA (headache)    "related to temporal arteritis 08/2014-01/2015"  . Hypercholesteremia   . Hypertension   . Hypoxia   . Kidney stones   . Left carotid artery stenosis   . Myocardial infarct  ([place]) 1999; 2001  . On home oxygen therapy    "2L at night; don't always use it" (11/12/2015)  . Pneumonia 1970s X 1; ~ 2006; ~ 2014  . PVD (peripheral vascular disease) ([place])   . Rheumatoid arthritis ([place])   . Syncope and collapse 10/05/2015   Past Surgical History:  Procedure Laterality Date  . CAROTID ANGIOGRAM N/A 10/10/2013   Procedure: CAROTID ANGIOGRAM;  Surgeon: Laverda Page, MD;  Location: [facility] CATH LAB;  Service: Cardiovascular;  Laterality: N/A;  . CAROTID ENDARTERECTOMY Left 2000  . CATARACT EXTRACTION W/ INTRAOCULAR LENS  IMPLANT, BILATERAL Bilateral   . CORONARY ANGIOPLASTY WITH STENT PLACEMENT  1999  . CYSTOSCOPY  X 2   "after I passed kidney stones"  . DILATION AND CURETTAGE OF UTERUS    . FEMORAL ARTERY STENT Right 2009   SFA  . ILIAC ARTERY STENT Left   . ILIAC ARTERY STENT Bilateral 11/12/2015   "left external; right lower iliac"  . LOWER EXTREMITY ANGIOGRAPHY N/A 04/13/2017   Procedure: Lower Extremity Angiography;  Surgeon: Adrian Prows, MD;  Location: [facility] CV LAB;  Service: Cardiovascular;  Laterality: N/A;  . PERIPHERAL VASCULAR CATHETERIZATION N/A 11/12/2015   Procedure: Lower Extremity Angiography;  Surgeon: Adrian Prows, MD;  Location: [facility];  Service: Cardiovascular;  Laterality: N/A;  . SUBCLAVIAN ARTERY STENT Left 2000   Possible same time Right SFA stent/notes 11/10/2015  . TUBAL LIGATION    . YAG LASER APPLICATION Left    "  for floaters"   Family History  Problem Relation Age of Onset  . Diabetes Mother   . Hypertension Mother   . Breast cancer Sister   . Heart attack Father     Social History   Tobacco Use  . Smoking status: Former Smoker    Packs/day: 2.00    Years: 42.00    Pack years: 84.00    Types: Cigarettes    Quit date: 2001    Years since quitting: 20.7  . Smokeless tobacco: Never Used  . Tobacco comment: Quit in 2000  Substance Use Topics  . Alcohol use: No    Alcohol/week: 0.0 standard drinks  Marital status:  Widowed  ROS  Review of Systems  Cardiovascular: Positive for claudication (mild, stable). Negative for chest pain, dyspnea on exertion, leg swelling, orthopnea, palpitations, paroxysmal nocturnal dyspnea and syncope.  Musculoskeletal: Positive for arthritis, back pain and muscle cramps.  Gastrointestinal: Negative for melena.   Objective  Blood pressure 136/70, pulse 65, height 5\' 4"  (1.626 m), weight 149 lb 9.6 oz (67.9 kg), SpO2 99 %.  Vitals with BMI 06/24/2020 06/24/2020 06/24/2020  Height - - 5\' 4"   Weight - - 149 lbs 10 oz  BMI - - 74.94  Systolic 496 759 163  Diastolic 70 60 56  Pulse - 65 64     Physical Exam Cardiovascular:     Rate and Rhythm: Normal rate and regular rhythm.     Pulses: Intact distal pulses.          Carotid pulses are on the right side with bruit and on the left side with bruit.      Femoral pulses are 2+ on the right side with bruit and 2+ on the left side with bruit.      Popliteal pulses are 0 on the right side and 1+ on the left side.       Dorsalis pedis pulses are 1+ on the right side and 1+ on the left side.       Posterior tibial pulses are 1+ on the right side and 0 on the left side.     Heart sounds: Normal heart sounds. No murmur heard.  No gallop.      Comments: No leg edema, no JVD. Pulmonary:     Effort: Pulmonary effort is normal.     Breath sounds: Normal breath sounds.  Abdominal:     General: Bowel sounds are normal.     Palpations: Abdomen is soft.    Laboratory examination:   CMP 05/16/2008  Glucose 120(H)  BUN 9  Creatinine 0.74  Sodium 138  Potassium 4.5 HEMOLYZED SPECIMEN, RESULTS MAY BE AFFECTED  Chloride 107  CO2 25  Calcium 9.0   CBC 05/16/2008  WBC 6.9  Hemoglobin 12.6  Hematocrit 37.1  Platelets 327   Lipid Panel  No results found for: CHOL, TRIG, HDL, CHOLHDL, VLDL, LDLCALC, LDLDIRECT HEMOGLOBIN A1C No results found for: HGBA1C, MPG TSH No results for input(s): TSH in the last 8760 hours.   External  labs:  04/22/2020:  HDL 65, LDL 65, triglycerides 123, total cholesterol 172  Cholesterol, total 172.000 M 10/23/2019 HDL 74.000 M 10/23/2019 LDL 121 Triglycerides 114.000 10/23/2019  Hemoglobin 11.200 G/ 06/16/2018  Creatinine, Serum 1.140 MG/ 10/23/2019 Potassium 4.400 MM 12/05/2015 ALT (SGPT) 15.000 IU/ 10/23/2019  Medications and allergies   Allergies  Allergen Reactions  . Sulfa Antibiotics Anaphylaxis  . Crestor [Rosuvastatin] Cough    "deathly ill"  . Ibuprofen Nausea And Vomiting  .  Ivp Dye [Iodinated Diagnostic Agents] Nausea And Vomiting and Other (See Comments)    "I may have had a rash too" "They normally give me benadryl and steroids before contrast dye"  . Lipitor [Atorvastatin] Other (See Comments)    Legs hurt so bad she couldn't walk  . Macrodantin Nausea And Vomiting    faint  . Naprosyn [Naproxen] Nausea Only and Other (See Comments)    faint  . Niaspan [Niacin Er] Other (See Comments)    Flushing, headache  . Vioxx [Rofecoxib] Other (See Comments)    "deathly ill"  . Zetia [Ezetimibe] Other (See Comments)    Deathly ill   . Meloxicam     Can't remember  . Ciprofloxacin Rash  . Neosporin [Neomycin-Bacitracin Zn-Polymyx] Rash  . Phenergan [Promethazine Hcl] Rash  . Pletal [Cilostazol] Other (See Comments)    headache  . Ramipril Cough    Current Outpatient Medications on File Prior to Visit  Medication Sig Dispense Refill  . aspirin 81 MG tablet Take 162 mg by mouth daily.     . cetirizine (ZYRTEC) 10 MG tablet Take 10 mg by mouth daily as needed for allergies.    . Cholecalciferol (VITAMIN D) 2000 UNITS CAPS Take 2,000 Units by mouth daily.    . Coenzyme Q10 (COQ10) 200 MG CAPS Take 1 capsule by mouth daily.    . Investigational - Study Medication Take 1 tablet by mouth every morning. Study name:  Cholesterol Med  Additional study details:    . losartan (COZAAR) 25 MG tablet Take 12.5 mg by mouth daily. 1/2 tablet daily    . pantoprazole (PROTONIX) 40  MG tablet Take 40 mg by mouth daily.    . predniSONE (DELTASONE) 5 MG tablet Take 5 mg by mouth daily.     . Probiotic Product (PROBIOTIC DAILY PO) Take 1 capsule by mouth daily.    . Vitamin D, Ergocalciferol, (DRISDOL) 50000 UNITS CAPS capsule Take 50,000 Units by mouth every 7 (seven) days.     No current facility-administered medications on file prior to visit.    Radiology:   No results found.  Cardiac Studies:   Coronary Angiography MI in 1999; Stent in right coronary artery in 1999; MI in 2001; Patent stent at that time  Treadmill stress test [05/11/2014]: Indications: Diagnosis of Coronary Artery Disease. Conclusions: Negative for ischemia. The patient exercised according to the Bruce protocol, Total time recorded 4 Min. 22 sec. achieving a max heart rate of 128 which was 85% of MPHR for age and 7.3 METS of work. Hypertensive. Baseline NIBP was 130/90. Peak NIBP was 162/102 MaxSysp was: 162 MaxDiasp was: 102. The baseline ECG showed NSR,Non specific ST depression. During exercise there was No additional ST segment depressions in the Inferior and lateral leads. Symptoms: Claudication right leg. Achieved 85% MPHR. Arrhythmia: Occasional PAC. Continue primary prevention. Eval for claudication.  Lower extremity arterial duplex 03/26/2016: No hemodynamically significant stenoses are identified in the bilateral lower extremity arterial system. This exam reveals mildly decreased perfusion of the right lower extremity, with ABI 1.02 and left lower extremity with ABI 0.99.  Study suggests patent right SFA and left iliac stent. No significant change compared to 03/27/2015.  Abdominal aortic duplex 12/30/2016: Diffuse plaque noted in the proximal, mid and distal aorta. Normal iliac velocity. No AAA observed.  Peripheral arteriogram 04/13/2017 Patent stents placed 11/12/2015- RCIA 9x19 mm Omnilink Elite balloon expandable stent, Left External iliac 71mm CSI atherectomy followed by 9x60 mm self  expanding Absolute Pro stent.  Right LE arteriogram: No significant disease.  Carotid artery duplex 66/03/3015:  Peak systolic velocities in the right bifurcation, internal, external and common carotid arteries are within normal limits. Diffuse heterogeneous plaque noted.  Doppler velocity suggests stenosis in the left internal carotid artery (50-69%). Diffuse heterogeneous plaque noted.  Antegrade right vertebral artery flow. Antegrade left vertebral artery flow.  Follow up in six months is appropriate if clinically indicated. Compared to 11/20/2019, no significant change.   EKG EKG 06/24/2020: Normal sinus rhythm at rate of 73 bpm, left atrial abnormality, nonspecific inferior and lateral coving ST depression.  Normal QT interval. No significant change from EKG 12/22/2019.   Assessment     ICD-10-CM   1. Coronary artery disease involving native coronary artery of native heart without angina pectoris  I25.10 EKG 12-Lead    bisoprolol (ZEBETA) 10 MG tablet    REPATHA SURECLICK 010 MG/ML SOAJ    DISCONTINUED: REPATHA SURECLICK 932 MG/ML SOAJ    DISCONTINUED: REPATHA SURECLICK 355 MG/ML SOAJ  2. PAD (peripheral artery disease) (HCC)  D32.2 REPATHA SURECLICK 025 MG/ML SOAJ    DISCONTINUED: REPATHA SURECLICK 427 MG/ML SOAJ    DISCONTINUED: REPATHA SURECLICK 062 MG/ML SOAJ  3. Asymptomatic bilateral carotid artery stenosis  I65.23   4. Hypercholesteremia  B76.28 REPATHA SURECLICK 315 MG/ML SOAJ    DISCONTINUED: REPATHA SURECLICK 176 MG/ML SOAJ    DISCONTINUED: REPATHA SURECLICK 160 MG/ML SOAJ  5. Essential hypertension  I10 bisoprolol (ZEBETA) 10 MG tablet     Meds ordered this encounter  Medications  . DISCONTD: REPATHA SURECLICK 737 MG/ML SOAJ    Sig: Inject 1 Syringe into the skin every 14 (fourteen) days.    Dispense:  6 mL    Refill:  3    6 prefilled syringes  . DISCONTD: REPATHA SURECLICK 106 MG/ML SOAJ    Sig: Inject 1 Syringe into the skin every 14 (fourteen) days.     Dispense:  6 mL    Refill:  3    6 prefilled syringes  . bisoprolol (ZEBETA) 10 MG tablet    Sig: Take 1 tablet (10 mg total) by mouth daily.    Dispense:  90 tablet    Refill:  3  . REPATHA SURECLICK 269 MG/ML SOAJ    Sig: Inject 1 Syringe into the skin every 14 (fourteen) days.    Dispense:  6 mL    Refill:  3    6 prefilled syringes    Medications Discontinued During This Encounter  Medication Reason  . REPATHA SURECLICK 485 MG/ML SOAJ Reorder  . REPATHA SURECLICK 462 MG/ML SOAJ   . bisoprolol (ZEBETA) 5 MG tablet Reorder  . REPATHA SURECLICK 703 MG/ML SOAJ     Recommendations:   [name]  is a  79 y.o. Caucasian female with coronary artery disease and myocardial infarction in 1999 &  2001, left carotid endarterectomy in 2000, left iliac artery stenting and right SFA stenting and left subclavian artery stenting in the remote past and repeat stenting of bilateral external iliac arteries on  11/12/2015 with balloon expandable stent on the right and self-expanding stent on the left. Repeat peripheral arteriogram on 05/10/2017 revealed widely patent stents.  She is enrolled in clinical trial for statin intolerance,  "Esperion".  She is now on Repatha and tolerating this well, started this sometime in September 2020.   Patient is presently doing well, however she does report mild bilateral symptoms of claudication.  As well as ongoing right leg pain secondary to  spinal compression with sciatica.  Right leg pain improved with physical therapy, she continues to follow with orthopedics for this issue.  In regard to carotid artery stenosis, it is stable per most recent duplex study with diffuse heterogeneous plaque noted bilaterally as well as 50-69% stenosis of the left internal carotid artery.  Will repeat carotid artery duplex in 6 months to continue to monitor.  Labs were reviewed, lipids remain well controlled.  Will continue Repatha as well as "Esperion" trial.   Patient's blood  pressure elevated will increase bisoprolol dose from 5 mg to 10 mg and continue losartan 12.5 mg daily.  She has been instructed to monitor her blood pressure regularly at home and to notify the office if it remains greater than 728 systolic.  Follow-up in 6 months for bilateral carotid artery stenosis, hypertension, hypercholesterolemia, PAD, CAD.   Patient was seen in collaboration with Dr. Einar Gip. He also reviewed patient's chart and examined the patient. Dr. Einar Gip is in agreement of the plan.    Alethia Berthold, PA-C 06/24/2020, 3:58 PM Office: [phone]

## 2020-06-25 ENCOUNTER — Other Ambulatory Visit: Payer: Self-pay

## 2020-06-25 ENCOUNTER — Telehealth: Payer: Self-pay

## 2020-06-25 DIAGNOSIS — I2510 Atherosclerotic heart disease of native coronary artery without angina pectoris: Secondary | ICD-10-CM

## 2020-06-25 DIAGNOSIS — E7800 Pure hypercholesterolemia, unspecified: Secondary | ICD-10-CM

## 2020-06-25 DIAGNOSIS — I739 Peripheral vascular disease, unspecified: Secondary | ICD-10-CM

## 2020-06-25 MED ORDER — REPATHA SURECLICK 140 MG/ML ~~LOC~~ SOAJ: 1.0000 | SUBCUTANEOUS | 3 refills | Status: DC

## 2020-06-25 NOTE — Telephone Encounter (Signed)
Clinicals faxed to [facility] for repatha

## 2020-06-27 ENCOUNTER — Other Ambulatory Visit: Payer: Self-pay | Admitting: Internal Medicine

## 2020-06-27 DIAGNOSIS — Z1231 Encounter for screening mammogram for malignant neoplasm of breast: Secondary | ICD-10-CM

## 2020-07-26 DIAGNOSIS — Z23 Encounter for immunization: Secondary | ICD-10-CM | POA: Diagnosis not present

## 2020-07-29 ENCOUNTER — Other Ambulatory Visit: Payer: Self-pay

## 2020-07-29 ENCOUNTER — Ambulatory Visit
Admission: RE | Admit: 2020-07-29 | Discharge: 2020-07-29 | Disposition: A | Discharge status: Home / Self Care | Payer: Medicare Other | Source: Ambulatory Visit | Attending: Internal Medicine | Admitting: Internal Medicine

## 2020-07-29 DIAGNOSIS — Z1231 Encounter for screening mammogram for malignant neoplasm of breast: Secondary | ICD-10-CM | POA: Diagnosis not present

## 2020-08-05 ENCOUNTER — Emergency Department (HOSPITAL_COMMUNITY)
Admission: EM | Admit: 2020-08-05 | Discharge: 2020-08-05 | Disposition: A | Discharge status: Home / Self Care | Payer: Medicare Other | Attending: Emergency Medicine | Admitting: Emergency Medicine

## 2020-08-05 ENCOUNTER — Emergency Department (HOSPITAL_COMMUNITY): Payer: Medicare Other

## 2020-08-05 ENCOUNTER — Other Ambulatory Visit: Payer: Self-pay

## 2020-08-05 ENCOUNTER — Encounter (HOSPITAL_COMMUNITY): Payer: Self-pay | Admitting: Emergency Medicine

## 2020-08-05 DIAGNOSIS — Z7982 Long term (current) use of aspirin: Secondary | ICD-10-CM | POA: Diagnosis not present

## 2020-08-05 DIAGNOSIS — S50312A Abrasion of left elbow, initial encounter: Secondary | ICD-10-CM | POA: Insufficient documentation

## 2020-08-05 DIAGNOSIS — S4992XA Unspecified injury of left shoulder and upper arm, initial encounter: Secondary | ICD-10-CM | POA: Diagnosis present

## 2020-08-05 DIAGNOSIS — Y9301 Activity, walking, marching and hiking: Secondary | ICD-10-CM | POA: Insufficient documentation

## 2020-08-05 DIAGNOSIS — M25522 Pain in left elbow: Secondary | ICD-10-CM | POA: Diagnosis not present

## 2020-08-05 DIAGNOSIS — R519 Headache, unspecified: Secondary | ICD-10-CM | POA: Insufficient documentation

## 2020-08-05 DIAGNOSIS — M7989 Other specified soft tissue disorders: Secondary | ICD-10-CM | POA: Diagnosis not present

## 2020-08-05 DIAGNOSIS — Z87891 Personal history of nicotine dependence: Secondary | ICD-10-CM | POA: Diagnosis not present

## 2020-08-05 DIAGNOSIS — W108XXA Fall (on) (from) other stairs and steps, initial encounter: Secondary | ICD-10-CM | POA: Diagnosis not present

## 2020-08-05 DIAGNOSIS — Z79899 Other long term (current) drug therapy: Secondary | ICD-10-CM | POA: Diagnosis not present

## 2020-08-05 DIAGNOSIS — M25552 Pain in left hip: Secondary | ICD-10-CM | POA: Diagnosis not present

## 2020-08-05 DIAGNOSIS — W19XXXA Unspecified fall, initial encounter: Secondary | ICD-10-CM

## 2020-08-05 DIAGNOSIS — S0990XA Unspecified injury of head, initial encounter: Secondary | ICD-10-CM | POA: Diagnosis not present

## 2020-08-05 DIAGNOSIS — M25571 Pain in right ankle and joints of right foot: Secondary | ICD-10-CM | POA: Diagnosis not present

## 2020-08-05 DIAGNOSIS — M25551 Pain in right hip: Secondary | ICD-10-CM | POA: Diagnosis not present

## 2020-08-05 DIAGNOSIS — I119 Hypertensive heart disease without heart failure: Secondary | ICD-10-CM | POA: Diagnosis not present

## 2020-08-05 DIAGNOSIS — M545 Low back pain, unspecified: Secondary | ICD-10-CM | POA: Diagnosis not present

## 2020-08-05 DIAGNOSIS — Z043 Encounter for examination and observation following other accident: Secondary | ICD-10-CM | POA: Diagnosis not present

## 2020-08-05 NOTE — Discharge Instructions (Addendum)
You were seen in the ED today for a fall.  Your x-rays did not show any fractures or dislocations.  Your CT scan of your head did not show any brain bleed.  Your lower back x-ray did show that you have what is called anterior listhesis which we suspect is from degenerative changes.  This has been seen on your prior imaging.  Take Tylenol per the counter dosing to help with discomfort.  Please apply ice wrapped in a towel 20 minutes on 40 minutes off for the next 48 hours to your elbow and ankle.  Please try to rest.  Please follow-up with your primary care provider within 3 days for reevaluation.  Return to the ER for new or worsening symptoms including but not limited to worsening pain, numbness, weakness, chest pain, abdominal pain, blood in your urine or stool, abnormal bleeding, fever, or any other concerns.

## 2020-08-05 NOTE — ED Provider Notes (Signed)
[name] Provider Note   CSN: [id number] Arrival date & time: 08/05/20  2047     History Chief Complaint  Patient presents with  . Fall   [name] is a 79 y.o. female with a history of CAD, hypertension, hypercholesterolemia, and rheumatoid arthritis who presents to the emergency department with status post mechanical fall with complaints of pain in multiple locations.  Patient states that she was walking [place] the movie theater when she slipped on the ground as it had been raining and fell.  She struck the back of her head but did not have any loss of consciousness.  She sustained injuries to her left elbow bilateral hips, and right ankle.  She is having discomfort in each of these areas.  No significant alleviating or aggravating factors.  She scraped her left elbow.  She has been ambulatory since the fall.  She denies visual disturbance, numbness, tingling, weakness, syncope, seizure activity, vomiting, chest pain, or abdominal pain.  She believes that her tetanus is up to date. She takes aspirin, no anticoagulation.   HPI     Past Medical History:  Diagnosis Date  . Anemia   . Chronic bronchitis ([name])   . Coronary artery disease   . Difficulty swallowing pills   . Difficulty swallowing solids   . GERD (gastroesophageal reflux disease)   . H/O temporal arteritis 08/2014-01/2015  . HA (headache)    "related to temporal arteritis 08/2014-01/2015"  . Hypercholesteremia   . Hypertension   . Hypoxia   . Kidney stones   . Left carotid artery stenosis   . Myocardial infarct ([name]) 1999; 2001  . On home oxygen therapy    "2L at night; don't always use it" (11/12/2015)  . Pneumonia 1970s X 1; ~ 2006; ~ 2014  . PVD (peripheral vascular disease) ([name])   . Rheumatoid arthritis ([name])   . Syncope and collapse 10/05/2015    Patient Active Problem List   Diagnosis Date Noted  . Hypercholesterolemia 11/13/2019  . Heart disease 11/13/2019  .  Gastroesophageal reflux disease 11/13/2019  . Degeneration of lumbar intervertebral disc 11/13/2019  . Lumbar adjacent segment disease with spondylolisthesis 11/13/2019  . Lumbar radiculopathy 11/13/2019  . Myocardial infarction ([name]) 11/13/2019  . Vitamin B6 deficiency 11/13/2019  . Rheumatism 11/13/2019  . Temporal arteritis ([name]) 11/12/2015  . Claudication ([name]) 11/12/2015  . Claudication in peripheral vascular disease ([name]) 10/07/2015  . Right hip pain 04/10/2015  . HA (headache)     Past Surgical History:  Procedure Laterality Date  . CAROTID ANGIOGRAM N/A 10/10/2013   Procedure: CAROTID ANGIOGRAM;  Surgeon: Laverda Page, MD;  Location: [name];  Service: Cardiovascular;  Laterality: N/A;  . CAROTID ENDARTERECTOMY Left 2000  . CATARACT EXTRACTION W/ INTRAOCULAR LENS  IMPLANT, BILATERAL Bilateral   . CORONARY ANGIOPLASTY WITH STENT PLACEMENT  1999  . CYSTOSCOPY  X 2   "after I passed kidney stones"  . DILATION AND CURETTAGE OF UTERUS    . FEMORAL ARTERY STENT Right 2009   SFA  . ILIAC ARTERY STENT Left   . ILIAC ARTERY STENT Bilateral 11/12/2015   "left external; right lower iliac"  . LOWER EXTREMITY ANGIOGRAPHY N/A 04/13/2017   Procedure: Lower Extremity Angiography;  Surgeon: Adrian Prows, MD;  Location: [name];  Service: Cardiovascular;  Laterality: N/A;  . PERIPHERAL VASCULAR CATHETERIZATION N/A 11/12/2015   Procedure: Lower Extremity Angiography;  Surgeon: Adrian Prows, MD;  Location: [name];  Service: Cardiovascular;  Laterality: N/A;  . SUBCLAVIAN ARTERY STENT Left 2000   Possible same time Right SFA stent/notes 11/10/2015  . TUBAL LIGATION    . YAG LASER APPLICATION Left    "for floaters"     OB History   No obstetric history on file.     Family History  Problem Relation Age of Onset  . Diabetes Mother   . Hypertension Mother   . Breast cancer Sister   . Heart attack Father     Social History   Tobacco Use  . Smoking status:  Former Smoker    Packs/day: 2.00    Years: 42.00    Pack years: 84.00    Types: Cigarettes    Quit date: 2001    Years since quitting: 20.8  . Smokeless tobacco: Never Used  . Tobacco comment: Quit in 2000  Vaping Use  . Vaping Use: Never used  Substance Use Topics  . Alcohol use: No    Alcohol/week: 0.0 standard drinks  . Drug use: No    Home Medications Prior to Admission medications   Medication Sig Start Date End Date Taking? Authorizing Provider  aspirin 81 MG tablet Take 162 mg by mouth daily.     [provider]  bisoprolol (ZEBETA) 10 MG tablet Take 1 tablet (10 mg total) by mouth daily. 06/24/20   Adrian Prows, MD  cetirizine (ZYRTEC) 10 MG tablet Take 10 mg by mouth daily as needed for allergies.    [provider]  Cholecalciferol (VITAMIN D) 2000 UNITS CAPS Take 2,000 Units by mouth daily.    [provider]  Coenzyme Q10 (COQ10) 200 MG CAPS Take 1 capsule by mouth daily.    [provider]  Investigational - Study Medication Take 1 tablet by mouth every morning. Study name:  Cholesterol Med  Additional study details:    [provider]  losartan (COZAAR) 25 MG tablet Take 12.5 mg by mouth daily. 1/2 tablet daily    [provider]  pantoprazole (PROTONIX) 40 MG tablet Take 40 mg by mouth daily.    [provider]  predniSONE (DELTASONE) 5 MG tablet Take 5 mg by mouth daily.     [provider]  Probiotic Product (PROBIOTIC DAILY PO) Take 1 capsule by mouth daily.    [provider]  REPATHA SURECLICK 536 MG/ML SOAJ Inject 1 Syringe into the skin every 14 (fourteen) days. 06/25/20   Adrian Prows, MD  Vitamin D, Ergocalciferol, (DRISDOL) 50000 UNITS CAPS capsule Take 50,000 Units by mouth every 7 (seven) days.    [provider]    Allergies    Bactrim [sulfamethoxazole-trimethoprim], Sulfa antibiotics, Crestor [rosuvastatin], Ibuprofen, Ivp dye [iodinated diagnostic agents], Lipitor  [atorvastatin], Macrodantin, Naprosyn [naproxen], Niaspan [niacin er], Vioxx [rofecoxib], Zetia [ezetimibe], Meloxicam, Ciprofloxacin, Neosporin [neomycin-bacitracin zn-polymyx], Phenergan [promethazine hcl], Pletal [cilostazol], and Ramipril  Review of Systems   Review of Systems  Constitutional: Negative for chills and fever.  Eyes: Negative for visual disturbance.  Respiratory: Negative for cough and shortness of breath.   Cardiovascular: Negative for chest pain.  Gastrointestinal: Negative for abdominal pain and vomiting.  Musculoskeletal: Positive for arthralgias. Negative for back pain.  Skin: Positive for wound.  Neurological: Positive for headaches. Negative for dizziness, seizures, syncope, facial asymmetry, speech difficulty, weakness, light-headedness and numbness.  All other systems reviewed and are negative.   Physical Exam Updated Vital Signs BP (!) 160/63 (BP Location: Left Arm)   Pulse 77   Temp 98.2 F (36.8  C) (Oral)   Resp 15   Ht 5\' 4"  (1.626 m)   Wt 68 kg   SpO2 98%   BMI 25.75 kg/m   Physical Exam Vitals and nursing note reviewed.  Constitutional:      General: She is not in acute distress.    Appearance: She is well-developed.  HENT:     Head: Normocephalic and atraumatic. No raccoon eyes or Battle's sign.     Comments: Tenderness to the posterior central scalp.  No significant hematomas or open wounds.    Right Ear: No hemotympanum.     Left Ear: No hemotympanum.  Eyes:     General:        Right eye: No discharge.        Left eye: No discharge.     Conjunctiva/sclera: Conjunctivae normal.     Pupils: Pupils are equal, round, and reactive to light.  Cardiovascular:     Rate and Rhythm: Normal rate and regular rhythm.     Heart sounds: No murmur heard.      Comments: 2+ symmetric radial and DP pulses. Pulmonary:     Effort: No respiratory distress.     Breath sounds: Normal breath sounds. No wheezing or rales.  Chest:     Chest wall: No  tenderness.  Abdominal:     General: There is no distension.     Palpations: Abdomen is soft.     Tenderness: There is no abdominal tenderness. There is no guarding or rebound.  Musculoskeletal:     Cervical back: Normal range of motion and neck supple. No tenderness. No spinous process tenderness.     Comments: Upper extremities: Patient has abrasion/skin tear to the left posterior elbow.  She has intact active range of motion throughout.  She is tender palpation to the left posterior elbow especially over the olecranon.  Otherwise nontender. Back: Tender to the central sacral region.  Otherwise no midline tenderness.  No palpable step-off. Lower extremities: Intact active range of motion throughout.  Tender to the bilateral hips laterally and tender to the right lateral malleolus.  Lower extremities are otherwise nontender.  Skin:    General: Skin is warm and dry.     Findings: No rash.  Neurological:     Comments: Sensation grossly tact bilateral upper and lower extremities.  5-5 symmetric grip strength.  5-5 strength with plantar dorsiflexion bilaterally.  Psychiatric:        Behavior: Behavior normal.     ED Results / Procedures / Treatments   Labs (all labs ordered are listed, but only abnormal results are displayed) Labs Reviewed - No data to display  EKG None  Radiology DG Sacrum/Coccyx  Result Date: 08/05/2020 CLINICAL DATA:  Fall with hip and low back pain EXAM: SACRUM AND COCCYX - 2+ VIEW COMPARISON:  CT lumbar spine 06/11/2017 FINDINGS: Left iliac stent. Pubic symphysis appears intact. 11 mm anterolisthesis L5 on S1 with degenerative changes. No acute fracture identified. IMPRESSION: 1. No acute osseous abnormality. 2. 11 mm anterolisthesis L5 on S1. Electronically Signed   By: Donavan Foil M.D.   On: 08/05/2020 23:27   DG Elbow Complete Left  Result Date: 08/05/2020 CLINICAL DATA:  Left elbow pain after fall. EXAM: LEFT ELBOW - COMPLETE 3+ VIEW COMPARISON:  None.  FINDINGS: There is no evidence of fracture, dislocation, or joint effusion. There is no evidence of arthropathy or other focal bone abnormality. Soft tissues are unremarkable. IMPRESSION: Negative radiographs of the left elbow. Electronically Signed  By: Keith Rake M.D.   On: 08/05/2020 22:57   DG Ankle Complete Right  Result Date: 08/05/2020 CLINICAL DATA:  Trip and fall with right ankle pain.  Swelling. EXAM: RIGHT ANKLE - COMPLETE 3+ VIEW COMPARISON:  None. FINDINGS: There is no evidence of fracture, dislocation, or joint effusion. Ankle mortise is preserved. There is a small plantar calcaneal spur. Soft tissues are unremarkable. IMPRESSION: 1. No acute fracture or subluxation of the right ankle. 2. Small plantar calcaneal spur. Electronically Signed   By: Keith Rake M.D.   On: 08/05/2020 23:06   CT Head Wo Contrast  Result Date: 08/05/2020 CLINICAL DATA:  Status post fall. EXAM: CT HEAD WITHOUT CONTRAST TECHNIQUE: Contiguous axial images were obtained from the base of the skull through the vertex without intravenous contrast. COMPARISON:  October 08, 2015 FINDINGS: Brain: No evidence of acute infarction, hemorrhage, hydrocephalus, extra-axial collection or mass lesion/mass effect. Vascular: No hyperdense vessel or unexpected calcification. Skull: Normal. Negative for fracture or focal lesion. Sinuses/Orbits: No acute finding. Other: None. IMPRESSION: No acute intracranial pathology. Electronically Signed   By: Virgina Norfolk M.D.   On: 08/05/2020 22:49   DG Hips Bilat W or Wo Pelvis 3-4 Views  Result Date: 08/05/2020 CLINICAL DATA:  Bilateral hip pain after fall. EXAM: DG HIP (WITH OR WITHOUT PELVIS) 3-4V BILAT COMPARISON:  None. FINDINGS: The cortical margins of the bony pelvis and bilateral hips are intact. No fracture. Pubic symphysis and sacroiliac joints are congruent. Degenerative change of both sacroiliac joints. Both femoral heads are well-seated in the respective  acetabula. Mild bilateral acetabular spurring. Pubic rami are intact. IMPRESSION: No fracture of the pelvis or hips. Electronically Signed   By: Keith Rake M.D.   On: 08/05/2020 22:59    Procedures Procedures (including critical care time)  Medications Ordered in ED Medications - No data to display  ED Course  I have reviewed the triage vital signs and the nursing notes.  Pertinent labs & imaging results that were available during my care of the patient were reviewed by me and considered in my medical decision making (see chart for details).    MDM Rules/Calculators/A&P                         Patient presents to the ED with complaints of headache as well as pain to her left elbow, right ankle, and bilateral hips status post mechanical fall.  Patient is nontoxic, resting comfortably, her vitals are within normal limits with exception of her elevated blood pressure, I have a low suspicion for hypertensive emergency.  Plan for imaging.  Offered analgesics, patient declined.  Additional history obtained:  Additional history obtained from nursing note review and chart review. Imaging Studies ordered:  I ordered imaging studies which included CT head, left elbow x-ray, right ankle x-ray, bilateral hip/pelvis x-ray, and sacral/coccyx x-ray, I independently visualized and interpreted imaging which showed no acute intracranial bleed, no acute fx/dislocations, sacral/coccyx xray shows degenerative changes & 11 mm anterolisthesis L5 on S1- reviewed prior imaging- CT L spine in 11/2019- 8 mm anterolisthesis at L5-S1 increases to 10 mm with standing and flexion, and further increases to 13 mm with extension- does not appear to be an acute finding. Overall in agreement with radiologist interpretation.  In terms of her wound, does not appear amenable to closure with sutures, staples, or skin adhesive.  This was cleansed and bandage per nursing staff.  Tetanus per patient report is up to  date. Patient  is neurovascularly intact distally to all areas of the discomfort.  Other than her sacral region she had no other midline tenderness to raise concern for spinal injury.  She has no chest or abdominal tenderness to raise concern for intrathoracic/abdominal trauma.  Patient is ambulatory. She overall appears appropriate for discharge. I discussed results, treatment plan, need for follow-up, and return precautions with the patient. Provided opportunity for questions, patient confirmed understanding and is in agreement with plan.   Findings and plan of care discussed with supervising physician Dr. Laverta Baltimore who is in agreement.   Portions of this note were generated with [employer]. Dictation errors may occur despite best attempts at proofreading.  Final Clinical Impression(s) / ED Diagnoses Final diagnoses:  Fall, initial encounter    Rx / DC Orders ED Discharge Orders    None       Amaryllis Dyke, PA-C 08/05/20 2345    Margette Fast, MD 08/07/20 [phone]

## 2020-08-05 NOTE — ED Triage Notes (Signed)
Patient was walking and missed her step. Patient fell down and hit the back of her head, right ankle, left elbow, and left hip. This happened about 30 min ago.

## 2020-08-07 DIAGNOSIS — M79644 Pain in right finger(s): Secondary | ICD-10-CM | POA: Diagnosis not present

## 2020-08-07 DIAGNOSIS — W19XXXD Unspecified fall, subsequent encounter: Secondary | ICD-10-CM | POA: Diagnosis not present

## 2020-08-07 DIAGNOSIS — S50312D Abrasion of left elbow, subsequent encounter: Secondary | ICD-10-CM | POA: Diagnosis not present

## 2020-08-07 DIAGNOSIS — M25551 Pain in right hip: Secondary | ICD-10-CM | POA: Diagnosis not present

## 2020-08-07 DIAGNOSIS — M25552 Pain in left hip: Secondary | ICD-10-CM | POA: Diagnosis not present

## 2020-08-28 DIAGNOSIS — E785 Hyperlipidemia, unspecified: Secondary | ICD-10-CM | POA: Diagnosis not present

## 2020-08-28 DIAGNOSIS — M8589 Other specified disorders of bone density and structure, multiple sites: Secondary | ICD-10-CM | POA: Diagnosis not present

## 2020-08-28 DIAGNOSIS — M5416 Radiculopathy, lumbar region: Secondary | ICD-10-CM | POA: Diagnosis not present

## 2020-08-28 DIAGNOSIS — I739 Peripheral vascular disease, unspecified: Secondary | ICD-10-CM | POA: Diagnosis not present

## 2020-08-28 DIAGNOSIS — M316 Other giant cell arteritis: Secondary | ICD-10-CM | POA: Diagnosis not present

## 2020-08-28 DIAGNOSIS — M1990 Unspecified osteoarthritis, unspecified site: Secondary | ICD-10-CM | POA: Diagnosis not present

## 2020-08-28 DIAGNOSIS — I10 Essential (primary) hypertension: Secondary | ICD-10-CM | POA: Diagnosis not present

## 2020-08-28 DIAGNOSIS — I2510 Atherosclerotic heart disease of native coronary artery without angina pectoris: Secondary | ICD-10-CM | POA: Diagnosis not present

## 2020-08-28 DIAGNOSIS — I6529 Occlusion and stenosis of unspecified carotid artery: Secondary | ICD-10-CM | POA: Diagnosis not present

## 2020-08-28 DIAGNOSIS — N390 Urinary tract infection, site not specified: Secondary | ICD-10-CM | POA: Diagnosis not present

## 2020-08-28 DIAGNOSIS — M7060 Trochanteric bursitis, unspecified hip: Secondary | ICD-10-CM | POA: Diagnosis not present

## 2020-09-19 DIAGNOSIS — N390 Urinary tract infection, site not specified: Secondary | ICD-10-CM | POA: Diagnosis not present

## 2020-09-19 DIAGNOSIS — R300 Dysuria: Secondary | ICD-10-CM | POA: Diagnosis not present

## 2020-10-16 DIAGNOSIS — E7800 Pure hypercholesterolemia, unspecified: Secondary | ICD-10-CM | POA: Diagnosis not present

## 2020-10-16 DIAGNOSIS — I10 Essential (primary) hypertension: Secondary | ICD-10-CM | POA: Diagnosis not present

## 2020-10-16 DIAGNOSIS — I739 Peripheral vascular disease, unspecified: Secondary | ICD-10-CM | POA: Diagnosis not present

## 2020-10-16 DIAGNOSIS — E559 Vitamin D deficiency, unspecified: Secondary | ICD-10-CM | POA: Diagnosis not present

## 2020-10-16 DIAGNOSIS — Z79899 Other long term (current) drug therapy: Secondary | ICD-10-CM | POA: Diagnosis not present

## 2020-10-16 DIAGNOSIS — T466X5A Adverse effect of antihyperlipidemic and antiarteriosclerotic drugs, initial encounter: Secondary | ICD-10-CM | POA: Diagnosis not present

## 2020-10-16 DIAGNOSIS — M791 Myalgia, unspecified site: Secondary | ICD-10-CM | POA: Diagnosis not present

## 2020-10-16 DIAGNOSIS — K219 Gastro-esophageal reflux disease without esophagitis: Secondary | ICD-10-CM | POA: Diagnosis not present

## 2020-10-16 DIAGNOSIS — I2510 Atherosclerotic heart disease of native coronary artery without angina pectoris: Secondary | ICD-10-CM | POA: Diagnosis not present

## 2020-11-19 DIAGNOSIS — E7800 Pure hypercholesterolemia, unspecified: Secondary | ICD-10-CM | POA: Diagnosis not present

## 2020-11-19 DIAGNOSIS — Z79899 Other long term (current) drug therapy: Secondary | ICD-10-CM | POA: Diagnosis not present

## 2020-11-19 DIAGNOSIS — E559 Vitamin D deficiency, unspecified: Secondary | ICD-10-CM | POA: Diagnosis not present

## 2020-11-19 DIAGNOSIS — I10 Essential (primary) hypertension: Secondary | ICD-10-CM | POA: Diagnosis not present

## 2020-11-19 DIAGNOSIS — I2510 Atherosclerotic heart disease of native coronary artery without angina pectoris: Secondary | ICD-10-CM | POA: Diagnosis not present

## 2020-11-26 DIAGNOSIS — I10 Essential (primary) hypertension: Secondary | ICD-10-CM | POA: Diagnosis not present

## 2020-11-26 DIAGNOSIS — E7800 Pure hypercholesterolemia, unspecified: Secondary | ICD-10-CM | POA: Diagnosis not present

## 2020-11-26 DIAGNOSIS — R7989 Other specified abnormal findings of blood chemistry: Secondary | ICD-10-CM | POA: Diagnosis not present

## 2020-11-26 DIAGNOSIS — R7303 Prediabetes: Secondary | ICD-10-CM | POA: Diagnosis not present

## 2020-12-10 ENCOUNTER — Other Ambulatory Visit: Payer: Self-pay

## 2020-12-10 ENCOUNTER — Ambulatory Visit: Payer: Medicare Other

## 2020-12-10 DIAGNOSIS — I6523 Occlusion and stenosis of bilateral carotid arteries: Secondary | ICD-10-CM | POA: Diagnosis not present

## 2020-12-15 ENCOUNTER — Other Ambulatory Visit: Payer: Self-pay | Admitting: Cardiology

## 2020-12-15 DIAGNOSIS — I6523 Occlusion and stenosis of bilateral carotid arteries: Secondary | ICD-10-CM

## 2020-12-16 DIAGNOSIS — H04123 Dry eye syndrome of bilateral lacrimal glands: Secondary | ICD-10-CM | POA: Diagnosis not present

## 2020-12-16 DIAGNOSIS — Z961 Presence of intraocular lens: Secondary | ICD-10-CM | POA: Diagnosis not present

## 2020-12-16 DIAGNOSIS — H353111 Nonexudative age-related macular degeneration, right eye, early dry stage: Secondary | ICD-10-CM | POA: Diagnosis not present

## 2020-12-16 DIAGNOSIS — H52203 Unspecified astigmatism, bilateral: Secondary | ICD-10-CM | POA: Diagnosis not present

## 2020-12-18 NOTE — Progress Notes (Signed)
Stable renal artery duplex with mild disease on the right and moderate disease on the left.

## 2020-12-23 ENCOUNTER — Other Ambulatory Visit: Payer: Self-pay

## 2020-12-23 ENCOUNTER — Ambulatory Visit: Payer: Medicare Other | Admitting: Cardiology

## 2020-12-23 ENCOUNTER — Ambulatory Visit
Admission: RE | Admit: 2020-12-23 | Discharge: 2020-12-23 | Disposition: A | Discharge status: Home / Self Care | Payer: Medicare Other | Source: Ambulatory Visit | Attending: Cardiology | Admitting: Cardiology

## 2020-12-23 ENCOUNTER — Encounter: Payer: Self-pay | Admitting: Cardiology

## 2020-12-23 VITALS — BP 116/67 | HR 71 | Temp 97.2°F | Resp 17 | Ht 64.0 in | Wt 153.2 lb

## 2020-12-23 DIAGNOSIS — Z006 Encounter for examination for normal comparison and control in clinical research program: Secondary | ICD-10-CM | POA: Diagnosis not present

## 2020-12-23 DIAGNOSIS — I10 Essential (primary) hypertension: Secondary | ICD-10-CM

## 2020-12-23 DIAGNOSIS — Z789 Other specified health status: Secondary | ICD-10-CM | POA: Diagnosis not present

## 2020-12-23 DIAGNOSIS — I700 Atherosclerosis of aorta: Secondary | ICD-10-CM | POA: Diagnosis not present

## 2020-12-23 DIAGNOSIS — I6523 Occlusion and stenosis of bilateral carotid arteries: Secondary | ICD-10-CM | POA: Diagnosis not present

## 2020-12-23 DIAGNOSIS — R0989 Other specified symptoms and signs involving the circulatory and respiratory systems: Secondary | ICD-10-CM

## 2020-12-23 DIAGNOSIS — M19012 Primary osteoarthritis, left shoulder: Secondary | ICD-10-CM | POA: Diagnosis not present

## 2020-12-23 DIAGNOSIS — I2510 Atherosclerotic heart disease of native coronary artery without angina pectoris: Secondary | ICD-10-CM

## 2020-12-23 DIAGNOSIS — R0600 Dyspnea, unspecified: Secondary | ICD-10-CM | POA: Diagnosis not present

## 2020-12-23 NOTE — Progress Notes (Unsigned)
Primary Physician/Referring:  Jani Gravel, MD  Patient ID: [name], female    DOB: 1941/01/24, 80 y.o.   MRN: [id number]  Chief Complaint  Patient presents with  . Follow-up    6 MONTH  . Coronary artery disease involving native coronary artery of   HPI:    [name]  is a 80 y.o. Caucasian female with coronary artery disease and myocardial infarction in 1999 &  2001, left carotid endarterectomy in 2000, left iliac artery stenting and right SFA stenting and left subclavian artery stenting in the remote past and repeat stenting of bilateral external iliac arteries on  11/12/2015 with balloon expandable stent on the right and self-expanding stent on the left. Repeat peripheral arteriogram on 05/10/2017 which revealed widely patent stents. She is now on Repatha and tolerating this well, started this sometime in September 2020.  She is also being followed in the Esperion Trial as she was on study Rx until recently.   Presents for 6 month follow up on CAD and PAD. She continues to complain of mild symptoms of claudication as well as right leg pain radiating from the hip likely secondary to sciatica.  This has been evaluated by orthopedics and recommended surgery due to spinal compression, however patient preferred to do physical therapy instead.   Denies chest pain, shortness of breath, palpitations, orthopnea, PND.  Past Medical History:  Diagnosis Date  . Anemia   . Chronic bronchitis ([place])   . Coronary artery disease   . Difficulty swallowing pills   . Difficulty swallowing solids   . GERD (gastroesophageal reflux disease)   . H/O temporal arteritis 08/2014-01/2015  . HA (headache)    "related to temporal arteritis 08/2014-01/2015"  . Hypercholesteremia   . Hypertension   . Hypoxia   . Kidney stones   . Left carotid artery stenosis   . Myocardial infarct ([place]) 1999; 2001  . On home oxygen therapy    "2L at night; don't always use it" (11/12/2015)  . Pneumonia 1970s X 1; ~  2006; ~ 2014  . PVD (peripheral vascular disease) ([place])   . Rheumatoid arthritis ([place])   . Syncope and collapse 10/05/2015   Past Surgical History:  Procedure Laterality Date  . CAROTID ANGIOGRAM N/A 10/10/2013   Procedure: CAROTID ANGIOGRAM;  Surgeon: Laverda Page, MD;  Location: [facility] CATH LAB;  Service: Cardiovascular;  Laterality: N/A;  . CAROTID ENDARTERECTOMY Left 2000  . CATARACT EXTRACTION W/ INTRAOCULAR LENS  IMPLANT, BILATERAL Bilateral   . CORONARY ANGIOPLASTY WITH STENT PLACEMENT  1999  . CYSTOSCOPY  X 2   "after I passed kidney stones"  . DILATION AND CURETTAGE OF UTERUS    . FEMORAL ARTERY STENT Right 2009   SFA  . ILIAC ARTERY STENT Left   . ILIAC ARTERY STENT Bilateral 11/12/2015   "left external; right lower iliac"  . LOWER EXTREMITY ANGIOGRAPHY N/A 04/13/2017   Procedure: Lower Extremity Angiography;  Surgeon: Adrian Prows, MD;  Location: [facility] CV LAB;  Service: Cardiovascular;  Laterality: N/A;  . PERIPHERAL VASCULAR CATHETERIZATION N/A 11/12/2015   Procedure: Lower Extremity Angiography;  Surgeon: Adrian Prows, MD;  Location: [place] CV LAB;  Service: Cardiovascular;  Laterality: N/A;  . SUBCLAVIAN ARTERY STENT Left 2000   Possible same time Right SFA stent/notes 11/10/2015  . TUBAL LIGATION    . YAG LASER APPLICATION Left    "for floaters"   Family History  Problem Relation Age of Onset  . Diabetes Mother   .  Hypertension Mother   . Breast cancer Sister   . Heart attack Father     Social History   Tobacco Use  . Smoking status: Former Smoker    Packs/day: 2.00    Years: 42.00    Pack years: 84.00    Types: Cigarettes    Quit date: 2001    Years since quitting: 21.2  . Smokeless tobacco: Never Used  . Tobacco comment: Quit in 2000  Substance Use Topics  . Alcohol use: No    Alcohol/week: 0.0 standard drinks  Marital status: Widowed  ROS  Review of Systems  Cardiovascular: Positive for claudication (mild, stable). Negative for chest pain,  dyspnea on exertion, leg swelling, orthopnea, palpitations, paroxysmal nocturnal dyspnea and syncope.  Musculoskeletal: Positive for arthritis, back pain and muscle cramps.  Gastrointestinal: Negative for melena.   Objective  Blood pressure 116/67, pulse 71, temperature (!) 97.2 F (36.2 C), temperature source Temporal, resp. rate 17, height _0  (1.626 m), weight 153 lb 3.2 oz (69.5 kg), SpO2 98 %.  Vitals with BMI 12/23/2020 12/23/2020 08/05/2020  Height - _1  -  Weight - 153 lbs 3 oz -  BMI - 80.16 -  Systolic 553 88 748  Diastolic 67 56 60  Pulse 71 85 69     Physical Exam Cardiovascular:     Rate and Rhythm: Normal rate and regular rhythm.     Pulses: Intact distal pulses.          Carotid pulses are on the right side with bruit and on the left side with bruit.      Femoral pulses are 2+ on the right side with bruit and 2+ on the left side with bruit.      Popliteal pulses are 0 on the right side and 1+ on the left side.       Dorsalis pedis pulses are 1+ on the right side and 1+ on the left side.       Posterior tibial pulses are 1+ on the right side and 0 on the left side.     Heart sounds: Normal heart sounds. No murmur heard. No gallop.      Comments: No leg edema, no JVD. Pulmonary:     Effort: Pulmonary effort is normal.     Breath sounds: Rhonchi (bilateral diffuse) present.  Abdominal:     General: Bowel sounds are normal.     Palpations: Abdomen is soft.    Laboratory examination:   CMP 05/16/2008  Glucose 120(H)  BUN 9  Creatinine 0.74  Sodium 138  Potassium 4.5 HEMOLYZED SPECIMEN, RESULTS MAY BE AFFECTED  Chloride 107  CO2 25  Calcium 9.0   CBC 05/16/2008  WBC 6.9  Hemoglobin 12.6  Hematocrit 37.1  Platelets 327   Lipid Panel  No results found for: CHOL, TRIG, HDL, CHOLHDL, VLDL, LDLCALC, LDLDIRECT HEMOGLOBIN A1C No results found for: HGBA1C, MPG TSH No results for input(s): TSH in the last 8760 hours.   External labs:   Labs  11/19/2020:  Hb 11.1/HCT 36.1, platelets 412.  Normal indicis.  Serum glucose 84 mg, potassium 4.2, BUN 17, creatinine 1.4, EGFR 38.64 mL.  CMP otherwise normal.  A1c 5.9%.  Total cholesterol 176, triglycerides 133, HDL 74, LDL 79.  04/22/2020:  HDL 65, LDL 65, triglycerides 123, total cholesterol 172  Cholesterol, total 172.000 M 10/23/2019 HDL 74.000 M 10/23/2019 LDL 121 Triglycerides 114.000 10/23/2019  Hemoglobin 11.200 G/ 06/16/2018  Creatinine, Serum 1.140 MG/ 10/23/2019 Potassium 4.400 MM 12/05/2015  ALT (SGPT) 15.000 IU/ 10/23/2019  Medications and allergies   Allergies  Allergen Reactions  . Bactrim [Sulfamethoxazole-Trimethoprim] Anaphylaxis  . Sulfa Antibiotics Anaphylaxis  . Crestor [Rosuvastatin] Cough    "deathly ill"  . Ibuprofen Nausea And Vomiting  . Ivp Dye [Iodinated Diagnostic Agents] Nausea And Vomiting and Other (See Comments)    "I may have had a rash too" "They normally give me benadryl and steroids before contrast dye"  . Lipitor [Atorvastatin] Other (See Comments)    Legs hurt so bad she couldn't walk  . Macrodantin Nausea And Vomiting    faint  . Naprosyn [Naproxen] Nausea Only and Other (See Comments)    faint  . Niaspan [Niacin Er] Other (See Comments)    Flushing, headache  . Vioxx [Rofecoxib] Other (See Comments)    "deathly ill"  . Zetia [Ezetimibe] Other (See Comments)    Deathly ill   . Bacitracin     Other reaction(s): Unknown  . Colesevelam Hcl     Other reaction(s): Unknown  . Fluvastatin Sodium     Other reaction(s): Unknown  . Meloxicam     Can't remember  . Tramadol Hcl Nausea Only  . Ciprofloxacin Rash  . Clarithromycin Rash  . Neosporin [Neomycin-Bacitracin Zn-Polymyx] Rash  . Phenergan [Promethazine Hcl] Rash  . Pletal [Cilostazol] Other (See Comments)    headache  . Ramipril Cough    Current Outpatient Medications on File Prior to Visit  Medication Sig Dispense Refill  . aspirin 81 MG tablet Take 162 mg by mouth  daily.     . bisoprolol (ZEBETA) 10 MG tablet Take 1 tablet (10 mg total) by mouth daily. 90 tablet 3  . cetirizine (ZYRTEC) 10 MG tablet Take 10 mg by mouth daily as needed for allergies.    . Cholecalciferol (VITAMIN D) 2000 UNITS CAPS Take 2,000 Units by mouth daily.    . Coenzyme Q10 (COQ10) 200 MG CAPS Take 1 capsule by mouth daily.    . Cyanocobalamin (VITAMIN B12) 1000 MCG TBCR Take 1 tablet by mouth daily.    . Investigational - Study Medication Take 1 tablet by mouth every morning. Study name:  Cholesterol Med  Additional study details:    . losartan (COZAAR) 25 MG tablet Take 12.5 mg by mouth daily. 1/2 tablet daily    . pantoprazole (PROTONIX) 40 MG tablet Take 40 mg by mouth daily.    . predniSONE (DELTASONE) 5 MG tablet Take 5 mg by mouth daily.     . Probiotic Product (PROBIOTIC DAILY PO) Take 1 capsule by mouth daily.    . Pyridoxine HCl (VITAMIN B6) 250 MG TABS Take 1 tablet by mouth daily.    Marland Kitchen REPATHA SURECLICK 798 MG/ML SOAJ Inject 1 Syringe into the skin every 14 (fourteen) days. 6 mL 3  . Vitamin D, Ergocalciferol, (DRISDOL) 50000 UNITS CAPS capsule Take 50,000 Units by mouth every 7 (seven) days.     No current facility-administered medications on file prior to visit.    Radiology:   No results found.  Cardiac Studies:   Coronary Angiography MI in 1999; Stent in right coronary artery in 1999; MI in 2001; Patent stent at that time  Treadmill stress test [05/11/2014]: Indications: Diagnosis of Coronary Artery Disease. Conclusions: Negative for ischemia. The patient exercised according to the Bruce protocol, Total time recorded 4 Min. 22 sec. achieving a max heart rate of 128 which was 85% of MPHR for age and 7.3 METS of work. Hypertensive. Baseline NIBP was 130/90. Peak  NIBP was 162/102 MaxSysp was: 162 MaxDiasp was: 102. The baseline ECG showed NSR,Non specific ST depression. During exercise there was No additional ST segment depressions in the Inferior and lateral  leads. Symptoms: Claudication right leg. Achieved 85% MPHR. Arrhythmia: Occasional PAC. Continue primary prevention. Eval for claudication.  Lower extremity arterial duplex 03/26/2016: No hemodynamically significant stenoses are identified in the bilateral lower extremity arterial system. This exam reveals mildly decreased perfusion of the right lower extremity, with ABI 1.02 and left lower extremity with ABI 0.99.  Study suggests patent right SFA and left iliac stent. No significant change compared to 03/27/2015.  Abdominal aortic duplex 12/30/2016: Diffuse plaque noted in the proximal, mid and distal aorta. Normal iliac velocity. No AAA observed.  Peripheral arteriogram 04/13/2017 Patent stents placed 11/12/2015- RCIA 9x19 mm Omnilink Elite balloon expandable stent, Left External iliac 6m CSI atherectomy followed by 9x60 mm self expanding Absolute Pro stent. Right LE arteriogram: No significant disease.  Carotid artery duplex 12/10/2020: Stenosis in the right internal carotid artery (16-49%). Stenosis in the right external carotid artery (<50%). Stenosis in the left internal carotid artery (50-69%). Antegrade right vertebral artery flow. Antegrade left vertebral artery flow. No significant change since 06/19/2020. Follow up in six months is appropriate if clinically indicated.  EKG    EKG 12/23/2020: Normal sinus rhythm at rate of 69 bpm, left atrial enlargement, normal axis.  No evidence of ischemia.  Normal EKG.   EKG 06/24/2020: Normal sinus rhythm at rate of 73 bpm, left atrial abnormality, nonspecific inferior and lateral coving ST depression.  Normal QT interval. No significant change from EKG 12/22/2019.   Assessment     ICD-10-CM   1. Essential hypertension  I10 EKG 12-Lead  2. Asymptomatic bilateral carotid artery stenosis  I65.23   3. Coronary artery disease involving native coronary artery of native heart without angina pectoris  I25.10   4. Statin intolerance  Z78.9   5.  Enrolled in clinical trial of drug  Z00.6   6. Abnormal lung sounds  R09.89 DG Chest 2 View     No orders of the defined types were placed in this encounter.   There are no discontinued medications.  Recommendations:   KANNELYSE REY is a  80y.o. Caucasian female with coronary artery disease and myocardial infarction in 1999 &  2001, left carotid endarterectomy in 2000, left iliac artery stenting and right SFA stenting and left subclavian artery stenting in the remote past and repeat stenting of bilateral external iliac arteries on  11/12/2015 with balloon expandable stent on the right and self-expanding stent on the left. Repeat peripheral arteriogram on 05/10/2017 which revealed widely patent stents. She is now on Repatha and tolerating this well, started this sometime in September 2020.  She is also being followed in the Esperion Trial as she was on study Rx until recently.   No change in physical exam or EKG.  Symptoms of claudication appear to suggest pseudoclaudication.  Previously her noninvasive work-up although negative, CT scan had revealed high-grade iliac stenosis, so if her symptoms persist repeat peripheral arteriogram may be indicated.  But clearly her symptoms have been felt due to neurogenic claudication.  Carotid artery stenosis has remained stable.  Continue routine surveillance.  Blood pressure is well controlled, lipids are at goal.  No symptoms of angina or heart failure.  No changes in the medications were done by me today, I will see her back in 6 months.   JAdrian Prows PA-C 12/24/2020, 6:22 AM  Office: [phone]

## 2020-12-24 NOTE — Progress Notes (Signed)
Chest x-ray appears to be clear.

## 2021-01-01 DIAGNOSIS — E785 Hyperlipidemia, unspecified: Secondary | ICD-10-CM | POA: Diagnosis not present

## 2021-01-01 DIAGNOSIS — I10 Essential (primary) hypertension: Secondary | ICD-10-CM | POA: Diagnosis not present

## 2021-01-01 DIAGNOSIS — M1990 Unspecified osteoarthritis, unspecified site: Secondary | ICD-10-CM | POA: Diagnosis not present

## 2021-01-01 DIAGNOSIS — I6529 Occlusion and stenosis of unspecified carotid artery: Secondary | ICD-10-CM | POA: Diagnosis not present

## 2021-01-01 DIAGNOSIS — M7060 Trochanteric bursitis, unspecified hip: Secondary | ICD-10-CM | POA: Diagnosis not present

## 2021-01-01 DIAGNOSIS — M316 Other giant cell arteritis: Secondary | ICD-10-CM | POA: Diagnosis not present

## 2021-01-01 DIAGNOSIS — M5416 Radiculopathy, lumbar region: Secondary | ICD-10-CM | POA: Diagnosis not present

## 2021-01-01 DIAGNOSIS — I739 Peripheral vascular disease, unspecified: Secondary | ICD-10-CM | POA: Diagnosis not present

## 2021-01-01 DIAGNOSIS — I2510 Atherosclerotic heart disease of native coronary artery without angina pectoris: Secondary | ICD-10-CM | POA: Diagnosis not present

## 2021-01-01 DIAGNOSIS — M8589 Other specified disorders of bone density and structure, multiple sites: Secondary | ICD-10-CM | POA: Diagnosis not present

## 2021-03-27 ENCOUNTER — Ambulatory Visit: Payer: Medicare Other | Admitting: Cardiology

## 2021-03-27 ENCOUNTER — Other Ambulatory Visit: Payer: Self-pay

## 2021-03-27 VITALS — BP 130/80 | HR 72 | Ht 64.0 in | Wt 151.0 lb

## 2021-03-27 DIAGNOSIS — I2510 Atherosclerotic heart disease of native coronary artery without angina pectoris: Secondary | ICD-10-CM

## 2021-03-27 DIAGNOSIS — E7800 Pure hypercholesterolemia, unspecified: Secondary | ICD-10-CM

## 2021-03-27 DIAGNOSIS — I6523 Occlusion and stenosis of bilateral carotid arteries: Secondary | ICD-10-CM

## 2021-03-27 DIAGNOSIS — Z006 Encounter for examination for normal comparison and control in clinical research program: Secondary | ICD-10-CM

## 2021-03-27 DIAGNOSIS — I739 Peripheral vascular disease, unspecified: Secondary | ICD-10-CM

## 2021-03-27 NOTE — Progress Notes (Signed)
   Primary Physician/Referring:  Jani Gravel, MD  Patient ID: [name], female    DOB: 23-May-1941, 80 y.o.   MRN: [id number]  No chief complaint on file.  HPI:    [name]  is a 80 y.o. Caucasian female with coronary artery disease and myocardial infarction in 1999 &  2001, left carotid endarterectomy in 2000, left iliac artery stenting and right SFA stenting and left subclavian artery stenting in the remote past and repeat stenting of bilateral external iliac arteries on  11/12/2015 with balloon expandable stent on the right and self-expanding stent on the left. Repeat peripheral arteriogram on 05/10/2017 which revealed widely patent stents. She is now on Repatha and tolerating this well, started this sometime in September 2020.  She is also being followed in the Esperion Trial as she was on study Rx until recently.   This is the end of the study visit.  Vitals with BMI 12/23/2020 12/23/2020 08/05/2020  Height - 5\' 4"  -  Weight - 153 lbs 3 oz -  BMI - 17.79 -  Systolic 390 88 300  Diastolic 67 56 60  Pulse 71 85 69   Physical Exam Neck:     Vascular: Carotid bruit (right) present.  Cardiovascular:     Rate and Rhythm: Normal rate and regular rhythm.     Pulses: Intact distal pulses.          Carotid pulses are  on the right side with bruit.      Femoral pulses are 2+ on the right side with bruit and 2+ on the left side with bruit.      Popliteal pulses are 0 on the right side and 1+ on the left side.       Dorsalis pedis pulses are 1+ on the right side and 1+ on the left side.       Posterior tibial pulses are 1+ on the right side and 0 on the left side.     Heart sounds: Normal heart sounds. No murmur heard.   No gallop.     Comments: No leg edema, no JVD. Pulmonary:     Effort: Pulmonary effort is normal.     Breath sounds: Rhonchi (bilateral diffuse) present.  Abdominal:     General: Bowel sounds are normal.     Palpations: Abdomen is soft.  Musculoskeletal:         General: No swelling.      ICD-10-CM   1. Coronary artery disease involving native coronary artery of native heart without angina pectoris  I25.10     2. Asymptomatic bilateral carotid artery stenosis  I65.23     3. PAD (peripheral artery disease) ([facility])  I73.9     4. Hypercholesteremia  E78.00     5. Enrolled in clinical trial of drug: Esperion - Bempidoic acid for hyperlipidemia- End of study 03/27/2021  Z00.6       Patient has completed the study follow-up, it was felt that as her LDL remain high, she is now on open label Repatha, continue the same.  She has an appointment to see me in 3 months.   Adrian Prows, MD, [facility] 03/27/2021, 9:49 AM Office: [phone] Fax: [phone] Pager: [phone]

## 2021-05-16 DIAGNOSIS — E7800 Pure hypercholesterolemia, unspecified: Secondary | ICD-10-CM | POA: Diagnosis not present

## 2021-05-16 DIAGNOSIS — R7303 Prediabetes: Secondary | ICD-10-CM | POA: Diagnosis not present

## 2021-05-16 DIAGNOSIS — R7989 Other specified abnormal findings of blood chemistry: Secondary | ICD-10-CM | POA: Diagnosis not present

## 2021-05-16 DIAGNOSIS — I10 Essential (primary) hypertension: Secondary | ICD-10-CM | POA: Diagnosis not present

## 2021-05-18 DIAGNOSIS — Z1212 Encounter for screening for malignant neoplasm of rectum: Secondary | ICD-10-CM | POA: Diagnosis not present

## 2021-05-18 DIAGNOSIS — Z1211 Encounter for screening for malignant neoplasm of colon: Secondary | ICD-10-CM | POA: Diagnosis not present

## 2021-05-26 LAB — COLOGUARD: COLOGUARD: NEGATIVE

## 2021-05-30 DIAGNOSIS — Z0000 Encounter for general adult medical examination without abnormal findings: Secondary | ICD-10-CM | POA: Diagnosis not present

## 2021-05-30 DIAGNOSIS — M316 Other giant cell arteritis: Secondary | ICD-10-CM | POA: Diagnosis not present

## 2021-05-30 DIAGNOSIS — R7303 Prediabetes: Secondary | ICD-10-CM | POA: Diagnosis not present

## 2021-05-30 DIAGNOSIS — E785 Hyperlipidemia, unspecified: Secondary | ICD-10-CM | POA: Diagnosis not present

## 2021-05-30 DIAGNOSIS — I10 Essential (primary) hypertension: Secondary | ICD-10-CM | POA: Diagnosis not present

## 2021-05-30 DIAGNOSIS — I739 Peripheral vascular disease, unspecified: Secondary | ICD-10-CM | POA: Diagnosis not present

## 2021-05-30 DIAGNOSIS — M5136 Other intervertebral disc degeneration, lumbar region: Secondary | ICD-10-CM | POA: Diagnosis not present

## 2021-05-30 DIAGNOSIS — K219 Gastro-esophageal reflux disease without esophagitis: Secondary | ICD-10-CM | POA: Diagnosis not present

## 2021-05-30 DIAGNOSIS — I2510 Atherosclerotic heart disease of native coronary artery without angina pectoris: Secondary | ICD-10-CM | POA: Diagnosis not present

## 2021-05-30 DIAGNOSIS — N1832 Chronic kidney disease, stage 3b: Secondary | ICD-10-CM | POA: Diagnosis not present

## 2021-06-03 ENCOUNTER — Other Ambulatory Visit: Payer: Medicare Other

## 2021-06-04 DIAGNOSIS — Z23 Encounter for immunization: Secondary | ICD-10-CM | POA: Diagnosis not present

## 2021-06-17 ENCOUNTER — Other Ambulatory Visit: Payer: Self-pay

## 2021-06-17 ENCOUNTER — Ambulatory Visit: Payer: Medicare Other

## 2021-06-17 DIAGNOSIS — I6523 Occlusion and stenosis of bilateral carotid arteries: Secondary | ICD-10-CM

## 2021-06-26 ENCOUNTER — Other Ambulatory Visit: Payer: Self-pay

## 2021-06-26 ENCOUNTER — Encounter: Payer: Self-pay | Admitting: Cardiology

## 2021-06-26 ENCOUNTER — Ambulatory Visit: Payer: Medicare Other | Admitting: Cardiology

## 2021-06-26 VITALS — BP 106/65 | HR 89 | Temp 97.6°F | Resp 16 | Ht 64.0 in | Wt 153.8 lb

## 2021-06-26 DIAGNOSIS — E7800 Pure hypercholesterolemia, unspecified: Secondary | ICD-10-CM | POA: Diagnosis not present

## 2021-06-26 DIAGNOSIS — I2510 Atherosclerotic heart disease of native coronary artery without angina pectoris: Secondary | ICD-10-CM

## 2021-06-26 DIAGNOSIS — I6523 Occlusion and stenosis of bilateral carotid arteries: Secondary | ICD-10-CM | POA: Diagnosis not present

## 2021-06-26 DIAGNOSIS — I739 Peripheral vascular disease, unspecified: Secondary | ICD-10-CM

## 2021-06-26 NOTE — Progress Notes (Signed)
Primary Physician/Referring:  [name], DO  Patient ID: [name], female    DOB: 1941/02/01, 80 y.o.   MRN: [id number]  Chief Complaint  Patient presents with   Coronary Artery Disease   Carotid Stenosis   Follow-up    6 month   HPI:    [name]  is a 80 y.o. Caucasian female with coronary artery disease and myocardial infarction in 1999 &  2001, left carotid endarterectomy in 2000, left iliac artery stenting and right SFA stenting and left subclavian artery stenting in the remote past and repeat stenting of bilateral external iliac arteries on  11/12/2015 with balloon expandable stent on the right and self-expanding stent on the left. Repeat peripheral arteriogram on 05/10/2017 which revealed widely patent stents. She is now on Repatha and tolerating this well, started this sometime in September 2020.  She is also being followed in the Esper[facility] Trial as she was on study Rx until recently.   Presents for 6 month follow up on CAD and PAD. She continues to complain of mild symptoms of claudication as well as right leg pain radiating from the hip likely secondary to sciatica.  This has been evaluated by orthopedics and recommended surgery due to spinal compression, however patient preferred to do physical therapy instead.   Denies chest pain, dyspnea has remained stable, no PND or orthopnea.  Past Medical History:  Diagnosis Date   Anemia    Chronic bronchitis (HCC)    Coronary artery disease    Difficulty swallowing pills    Difficulty swallowing solids    GERD (gastroesophageal reflux disease)    H/O temporal arteritis 08/2014-01/2015   HA (headache)    "related to temporal arteritis 08/2014-01/2015"   Hypercholesteremia    Hypertension    Hypoxia    Kidney stones    Left carotid artery stenosis    Myocardial infarct ([place]) 1999; 2001   On home oxygen therapy    "2L at night; don't always use it" (11/12/2015)   Pneumonia 1970s X 1; ~ 2006; ~ 2014   PVD (peripheral  vascular disease) ([place])    Rheumatoid arthritis ([place])    Syncope and collapse 10/05/2015   Past Surgical History:  Procedure Laterality Date   CAROTID ANGIOGRAM N/A 10/10/2013   Procedure: CAROTID ANGIOGRAM;  Surgeon: Laverda Page, MD;  Location: [facility];  Service: Cardiovascular;  Laterality: N/A;   CAROTID ENDARTERECTOMY Left 2000   CATARACT EXTRACTION W/ INTRAOCULAR LENS  IMPLANT, BILATERAL Bilateral    CORONARY ANGIOPLASTY WITH STENT PLACEMENT  1999   CYSTOSCOPY  X 2   "after I passed kidney stones"   DILATION AND CURETTAGE OF UTERUS     FEMORAL ARTERY STENT Right 2009   SFA   ILIAC ARTERY STENT Left    ILIAC ARTERY STENT Bilateral 11/12/2015   "left external; right lower iliac"   LOWER EXTREMITY ANGIOGRAPHY N/A 04/13/2017   Procedure: Lower Extremity Angiography;  Surgeon: Adrian Prows, MD;  Location: [facility];  Service: Cardiovascular;  Laterality: N/A;   PERIPHERAL VASCULAR CATHETERIZATION N/A 11/12/2015   Procedure: Lower Extremity Angiography;  Surgeon: Adrian Prows, MD;  Location: [place] [facility];  Service: Cardiovascular;  Laterality: N/A;   SUBCLAVIAN ARTERY STENT Left 2000   Possible same time Right SFA stent/notes 11/10/2015   TUBAL LIGATION     YAG LASER APPLICATION Left    "for floaters"   Family History  Problem Relation Age of Onset   Diabetes Mother  Hypertension Mother    Breast cancer Sister    Heart attack Father     Social History   Tobacco Use   Smoking status: Former    Packs/day: 2.00    Years: 42.00    Pack years: 84.00    Types: Cigarettes    Quit date: 2001    Years since quitting: 21.7   Smokeless tobacco: Never   Tobacco comments:    Quit in 2000  Substance Use Topics   Alcohol use: No    Alcohol/week: 0.0 standard drinks  Marital status: Widowed  ROS  Review of Systems  Cardiovascular:  Positive for claudication (mild, stable) and dyspnea on exertion. Negative for chest pain, leg swelling, orthopnea, paroxysmal  nocturnal dyspnea and syncope.  Musculoskeletal:  Positive for arthritis, back pain and muscle cramps.  Gastrointestinal:  Negative for melena.  Objective  Blood pressure 106/65, pulse 89, temperature 97.6 F (36.4 C), temperature source Temporal, resp. rate 16, height _0  (1.626 m), weight 153 lb 12.8 oz (69.8 kg), SpO2 93 %.  Vitals with BMI 06/26/2021 06/26/2021 03/27/2021  Height - _1  _2   Weight - 153 lbs 13 oz 151 lbs  BMI - 53.61 44.31  Systolic 540 086 761  Diastolic 65 65 80  Pulse 89 93 72     Physical Exam Neck:     Vascular: Carotid bruit (bilateral) present. No JVD.  Cardiovascular:     Rate and Rhythm: Normal rate and regular rhythm.     Pulses: Intact distal pulses.          Femoral pulses are 0 on the right side with bruit and 0 on the left side with bruit.      Popliteal pulses are 0 on the right side and 1+ on the left side.       Dorsalis pedis pulses are 2+ on the right side and 2+ on the left side.       Posterior tibial pulses are 1+ on the right side and 0 on the left side.     Heart sounds: Normal heart sounds. No murmur heard.   No gallop.     Comments: No leg edema, no JVD. Pulmonary:     Effort: Pulmonary effort is normal.     Breath sounds: Rhonchi (bilateral diffuse) present.  Abdominal:     General: Bowel sounds are normal.     Palpations: Abdomen is soft.  Musculoskeletal:        General: No swelling.  Skin:    Capillary Refill: Capillary refill takes less than 2 seconds.  Neurological:     General: No focal deficit present.   Laboratory examination:   CMP 05/16/2008  Glucose 120(H)  BUN 9  Creatinine 0.74  Sodium 138  Potassium 4.5 HEMOLYZED SPECIMEN, RESULTS MAY BE AFFECTED  Chloride 107  CO2 25  Calcium 9.0   CBC 05/16/2008  WBC 6.9  Hemoglobin 12.6  Hematocrit 37.1  Platelets 327   Lipid Panel  No results found for: CHOL, TRIG, HDL, CHOLHDL, VLDL, LDLCALC, LDLDIRECT HEMOGLOBIN A1C No results found for: HGBA1C,  MPG TSH No results for input(s): TSH in the last 8760 hours.   External labs:   Labs 11/19/2020: Hb 11.1/HCT 36.1, platelets 412.  Normal indicis.  Serum glucose 84 mg, potassium 4.2, BUN 17, creatinine 1.4, EGFR 38.64 mL, CMP otherwise normal.  A1c 5.9%.  Total cholesterol 176, triglycerides 133, HDL 74, LDL 79.   Labs 11/19/2020:  Hb 11.1/HCT 36.1, platelets 412.  Normal indicis.  Serum glucose 84 mg, potassium 4.2, BUN 17, creatinine 1.4, EGFR 38.64 mL.  CMP otherwise normal.  A1c 5.9%.  Total cholesterol 176, triglycerides 133, HDL 74, LDL 79.  Medications and allergies   Allergies  Allergen Reactions   Bactrim [Sulfamethoxazole-Trimethoprim] Anaphylaxis   Sulfa Antibiotics Anaphylaxis   Crestor [Rosuvastatin] Cough    "deathly ill"   Ibuprofen Nausea And Vomiting   Ivp Dye [Iodinated Diagnostic Agents] Nausea And Vomiting and Other (See Comments)    "I may have had a rash too" "They normally give me benadryl and steroids before contrast dye"   Lipitor [Atorvastatin] Other (See Comments)    Legs hurt so bad she couldn't walk   Macrodantin Nausea And Vomiting    faint   Naprosyn [Naproxen] Nausea Only and Other (See Comments)    faint   Niaspan [Niacin Er] Other (See Comments)    Flushing, headache   Vioxx [Rofecoxib] Other (See Comments)    "deathly ill"   Zetia [Ezetimibe] Other (See Comments)    Deathly ill    Bacitracin     Other reaction(s): Unknown   Colesevelam Hcl     Other reaction(s): Unknown   Fluvastatin Sodium     Other reaction(s): Unknown   Meloxicam     Can't remember   Tramadol Hcl Nausea Only   Ciprofloxacin Rash   Clarithromycin Rash   Neosporin [Neomycin-Bacitracin Zn-Polymyx] Rash   Phenergan [Promethazine Hcl] Rash   Pletal [Cilostazol] Other (See Comments)    headache   Ramipril Cough    Current Outpatient Medications on File Prior to Visit  Medication Sig Dispense Refill   aspirin 81 MG tablet Take 162 mg by mouth daily.       bisoprolol (ZEBETA) 10 MG tablet Take 1 tablet (10 mg total) by mouth daily. 90 tablet 3   cetirizine (ZYRTEC) 10 MG tablet Take 10 mg by mouth daily as needed for allergies.     Cholecalciferol (VITAMIN D) 2000 UNITS CAPS Take 2,000 Units by mouth daily.     Coenzyme Q10 (COQ10) 200 MG CAPS Take 1 capsule by mouth daily.     Cyanocobalamin (VITAMIN B12) 1000 MCG TBCR Take 1 tablet by mouth daily.     losartan (COZAAR) 25 MG tablet Take 12.5 mg by mouth daily. 1/2 tablet daily     pantoprazole (PROTONIX) 40 MG tablet Take 40 mg by mouth daily.     predniSONE (DELTASONE) 5 MG tablet Take 5 mg by mouth daily.      Probiotic Product (PROBIOTIC DAILY PO) Take 1 capsule by mouth daily.     Pyridoxine HCl (VITAMIN B6) 250 MG TABS Take 1 tablet by mouth daily.     REPATHA SURECLICK 622 MG/ML SOAJ Inject 1 Syringe into the skin every 14 (fourteen) days. 6 mL 3   No current facility-administered medications on file prior to visit.    Radiology:   Chest x-ray PA and lateral view 12/24/2020: The heart size and mediastinal contours are within normal limits. Both lungs are clear. The visualized skeletal structures are unremarkable. Aortic calcifications are noted. There appears to be a left subclavian artery stent in place. There are advanced degenerative changes of the left glenohumeral joint with remodeling of the left humeral head.   IMPRESSION: No active cardiopulmonary disease.  Cardiac Studies:   Coronary Angiography MI in 1999; Stent in right coronary artery in 1999; MI in 2001; Patent stent at that time  Treadmill stress test  [05/11/2014]: Indications: Diagnosis of  Coronary Artery Disease. Conclusions: Negative for ischemia. The patient exercised according to the Bruce protocol, Total time recorded 4 Min. 22 sec. achieving a max heart rate of 128 which was 85% of MPHR for age and 7.3 METS of work. Hypertensive. Baseline NIBP was 130/90. Peak NIBP was 162/102 MaxSysp was: 162 MaxDiasp  was: 102. The baseline ECG showed NSR,Non specific ST depression. During exercise there was No additional ST segment depressions in the Inferior and lateral leads. Symptoms: Claudication right leg. Achieved 85% MPHR. Arrhythmia: Occasional PAC. Continue primary prevention. Eval for claudication.  Lower extremity arterial duplex 03/26/2016: No hemodynamically significant stenoses are identified in the bilateral lower extremity arterial system. This exam reveals mildly decreased perfusion of the right lower extremity, with ABI 1.02 and left lower extremity with ABI 0.99.  Study suggests patent right SFA and left iliac stent. No significant change compared to 03/27/2015.  Abdominal aortic duplex 12/30/2016: Diffuse plaque noted in the proximal, mid and distal aorta. Normal iliac velocity. No AAA observed.  Peripheral arteriogram 04/13/2017 Patent stents placed 11/12/2015- RCIA 9x19 mm Omnilink Elite balloon expandable stent, Left External iliac 62m CSI atherectomy followed by 9x60 mm self expanding Absolute Pro stent. Right LE arteriogram: No significant disease.  Carotid artery duplex 06/17/2021: Duplex suggests stenosis in the right internal carotid artery (16-49%). Duplex suggests stenosis in the left internal carotid artery (50-69%). Duplex suggests stenosis in the left external carotid artery (<50%). Antegrade right vertebral artery flow.  No significant change from 12/10/2020. Follow up in six months is appropriate if clinically indicated.  EKG    EKG 12/23/2020: Normal sinus rhythm at rate of 69 bpm, left atrial enlargement, normal axis.  No evidence of ischemia.  Normal EKG.   EKG 06/24/2020: Normal sinus rhythm at rate of 73 bpm, left atrial abnormality, nonspecific inferior and lateral coving ST depression.  Normal QT interval. No significant change from EKG 12/22/2019.   Assessment     ICD-10-CM   1. Coronary artery disease involving native coronary artery of native heart without  angina pectoris  I25.10 EKG 12-Lead    2. Asymptomatic bilateral carotid artery stenosis  I65.23 PCV CAROTID DUPLEX (BILATERAL)    3. PAD (peripheral artery disease) (HCC)  I73.9     4. Hypercholesteremia  E78.00        No orders of the defined types were placed in this encounter.   Medications Discontinued During This Encounter  Medication Reason   Investigational - Study Medication Error   Vitamin D, Ergocalciferol, (DRISDOL) 50000 UNITS CAPS capsule Error    Recommendations:   [name] is a  80y.o. Caucasian female with coronary artery disease and myocardial infarction in 1999 &  2001, left carotid endarterectomy in 2000, left iliac artery stenting and right SFA stenting and left subclavian artery stenting in the remote past and repeat stenting of bilateral external iliac arteries on  11/12/2015 with balloon expandable stent on the right and self-expanding stent on the left. Repeat peripheral arteriogram on 05/10/2017 which revealed widely patent stents. She is now on Repatha and tolerating this well, started this sometime in September 2020.  She is also being followed in the Esper[facility] Trial as she was on study Rx until recently.   No change in physical exam or EKG.  Symptoms of claudication appear to suggest pseudoclaudication she has been recommended surgery by neurosurgery but patient has been reluctant to having surgery as she has social obligations.   Carotid artery stenosis has remained stable.  Continue  routine surveillance.  Blood pressure is well controlled, she is presently on Repatha for hyperlipidemia, LDL is slightly high at 79 however she has not been able to tolerate any statins or even Zetia.  Hence we will continue present therapy.  From cardiac standpoint there is no recurrence of angina pectoris or clinical heart failure.  She does have very coarse crackles in bilateral bases of her lungs, chest x-ray was performed 6 months ago which did not reveal any significant  abnormality.  Suspect underlying COPD from prior tobacco use history.  Office visit in a year continue carotid surveillance in 6 months and see her back sooner if any other issues were to arise.  Drue Stager, PA-C 06/26/2021, 1:32 PM Office: [phone]

## 2021-07-02 ENCOUNTER — Other Ambulatory Visit: Payer: Self-pay | Admitting: Family Medicine

## 2021-07-02 DIAGNOSIS — M5416 Radiculopathy, lumbar region: Secondary | ICD-10-CM | POA: Diagnosis not present

## 2021-07-02 DIAGNOSIS — M1990 Unspecified osteoarthritis, unspecified site: Secondary | ICD-10-CM | POA: Diagnosis not present

## 2021-07-02 DIAGNOSIS — Z1231 Encounter for screening mammogram for malignant neoplasm of breast: Secondary | ICD-10-CM

## 2021-07-02 DIAGNOSIS — I10 Essential (primary) hypertension: Secondary | ICD-10-CM | POA: Diagnosis not present

## 2021-07-02 DIAGNOSIS — I739 Peripheral vascular disease, unspecified: Secondary | ICD-10-CM | POA: Diagnosis not present

## 2021-07-02 DIAGNOSIS — N289 Disorder of kidney and ureter, unspecified: Secondary | ICD-10-CM | POA: Diagnosis not present

## 2021-07-02 DIAGNOSIS — M8589 Other specified disorders of bone density and structure, multiple sites: Secondary | ICD-10-CM | POA: Diagnosis not present

## 2021-07-02 DIAGNOSIS — I2510 Atherosclerotic heart disease of native coronary artery without angina pectoris: Secondary | ICD-10-CM | POA: Diagnosis not present

## 2021-07-02 DIAGNOSIS — E785 Hyperlipidemia, unspecified: Secondary | ICD-10-CM | POA: Diagnosis not present

## 2021-07-02 DIAGNOSIS — Z23 Encounter for immunization: Secondary | ICD-10-CM | POA: Diagnosis not present

## 2021-07-02 DIAGNOSIS — M316 Other giant cell arteritis: Secondary | ICD-10-CM | POA: Diagnosis not present

## 2021-07-02 DIAGNOSIS — M7060 Trochanteric bursitis, unspecified hip: Secondary | ICD-10-CM | POA: Diagnosis not present

## 2021-07-02 DIAGNOSIS — I6529 Occlusion and stenosis of unspecified carotid artery: Secondary | ICD-10-CM | POA: Diagnosis not present

## 2021-07-10 ENCOUNTER — Other Ambulatory Visit: Payer: Self-pay | Admitting: Cardiology

## 2021-07-10 DIAGNOSIS — I2510 Atherosclerotic heart disease of native coronary artery without angina pectoris: Secondary | ICD-10-CM

## 2021-07-10 DIAGNOSIS — I10 Essential (primary) hypertension: Secondary | ICD-10-CM

## 2021-07-31 DIAGNOSIS — R5383 Other fatigue: Secondary | ICD-10-CM | POA: Diagnosis not present

## 2021-07-31 DIAGNOSIS — N390 Urinary tract infection, site not specified: Secondary | ICD-10-CM | POA: Diagnosis not present

## 2021-07-31 DIAGNOSIS — R052 Subacute cough: Secondary | ICD-10-CM | POA: Diagnosis not present

## 2021-07-31 DIAGNOSIS — R197 Diarrhea, unspecified: Secondary | ICD-10-CM | POA: Diagnosis not present

## 2021-08-07 ENCOUNTER — Ambulatory Visit: Payer: Medicare Other

## 2021-08-07 DIAGNOSIS — R5383 Other fatigue: Secondary | ICD-10-CM | POA: Diagnosis not present

## 2021-08-07 DIAGNOSIS — R052 Subacute cough: Secondary | ICD-10-CM | POA: Diagnosis not present

## 2021-08-07 DIAGNOSIS — R197 Diarrhea, unspecified: Secondary | ICD-10-CM | POA: Diagnosis not present

## 2021-08-07 DIAGNOSIS — I10 Essential (primary) hypertension: Secondary | ICD-10-CM | POA: Diagnosis not present

## 2021-08-14 ENCOUNTER — Ambulatory Visit
Admission: RE | Admit: 2021-08-14 | Discharge: 2021-08-14 | Disposition: A | Discharge status: Home / Self Care | Payer: Medicare Other | Source: Ambulatory Visit | Attending: Family Medicine | Admitting: Family Medicine

## 2021-08-14 DIAGNOSIS — Z1231 Encounter for screening mammogram for malignant neoplasm of breast: Secondary | ICD-10-CM | POA: Diagnosis not present

## 2021-08-25 DIAGNOSIS — Z23 Encounter for immunization: Secondary | ICD-10-CM | POA: Diagnosis not present

## 2021-09-26 ENCOUNTER — Emergency Department (HOSPITAL_COMMUNITY)
Admission: EM | Admit: 2021-09-26 | Discharge: 2021-09-27 | Disposition: A | Discharge status: Home / Self Care | Payer: Medicare Other | Attending: Physician Assistant | Admitting: Physician Assistant

## 2021-09-26 ENCOUNTER — Emergency Department (HOSPITAL_COMMUNITY): Payer: Medicare Other

## 2021-09-26 ENCOUNTER — Encounter (HOSPITAL_COMMUNITY): Payer: Self-pay

## 2021-09-26 ENCOUNTER — Other Ambulatory Visit: Payer: Self-pay

## 2021-09-26 ENCOUNTER — Ambulatory Visit
Admission: EM | Admit: 2021-09-26 | Discharge: 2021-09-26 | Disposition: A | Discharge status: Other Acute Inpatient Hospital | Payer: Medicare Other

## 2021-09-26 DIAGNOSIS — G4489 Other headache syndrome: Secondary | ICD-10-CM | POA: Diagnosis not present

## 2021-09-26 DIAGNOSIS — R519 Headache, unspecified: Secondary | ICD-10-CM | POA: Insufficient documentation

## 2021-09-26 DIAGNOSIS — R531 Weakness: Secondary | ICD-10-CM | POA: Insufficient documentation

## 2021-09-26 DIAGNOSIS — I2510 Atherosclerotic heart disease of native coronary artery without angina pectoris: Secondary | ICD-10-CM | POA: Insufficient documentation

## 2021-09-26 DIAGNOSIS — Z20822 Contact with and (suspected) exposure to covid-19: Secondary | ICD-10-CM | POA: Insufficient documentation

## 2021-09-26 DIAGNOSIS — Z87891 Personal history of nicotine dependence: Secondary | ICD-10-CM | POA: Insufficient documentation

## 2021-09-26 DIAGNOSIS — I959 Hypotension, unspecified: Secondary | ICD-10-CM | POA: Insufficient documentation

## 2021-09-26 DIAGNOSIS — I10 Essential (primary) hypertension: Secondary | ICD-10-CM | POA: Insufficient documentation

## 2021-09-26 DIAGNOSIS — H9201 Otalgia, right ear: Secondary | ICD-10-CM | POA: Diagnosis not present

## 2021-09-26 DIAGNOSIS — J069 Acute upper respiratory infection, unspecified: Secondary | ICD-10-CM

## 2021-09-26 DIAGNOSIS — N179 Acute kidney failure, unspecified: Secondary | ICD-10-CM | POA: Diagnosis not present

## 2021-09-26 DIAGNOSIS — R5383 Other fatigue: Secondary | ICD-10-CM | POA: Diagnosis not present

## 2021-09-26 DIAGNOSIS — R031 Nonspecific low blood-pressure reading: Secondary | ICD-10-CM | POA: Diagnosis not present

## 2021-09-26 DIAGNOSIS — R059 Cough, unspecified: Secondary | ICD-10-CM | POA: Insufficient documentation

## 2021-09-26 DIAGNOSIS — R0989 Other specified symptoms and signs involving the circulatory and respiratory systems: Secondary | ICD-10-CM

## 2021-09-26 DIAGNOSIS — R0902 Hypoxemia: Secondary | ICD-10-CM | POA: Diagnosis not present

## 2021-09-26 DIAGNOSIS — R42 Dizziness and giddiness: Secondary | ICD-10-CM | POA: Insufficient documentation

## 2021-09-26 DIAGNOSIS — Z79899 Other long term (current) drug therapy: Secondary | ICD-10-CM | POA: Diagnosis not present

## 2021-09-26 DIAGNOSIS — E860 Dehydration: Secondary | ICD-10-CM | POA: Diagnosis not present

## 2021-09-26 DIAGNOSIS — R0981 Nasal congestion: Secondary | ICD-10-CM | POA: Insufficient documentation

## 2021-09-26 DIAGNOSIS — Z7982 Long term (current) use of aspirin: Secondary | ICD-10-CM | POA: Insufficient documentation

## 2021-09-26 DIAGNOSIS — M791 Myalgia, unspecified site: Secondary | ICD-10-CM | POA: Diagnosis not present

## 2021-09-26 LAB — URINALYSIS, ROUTINE W REFLEX MICROSCOPIC
Glucose, UA: NEGATIVE mg/dL
Hgb urine dipstick: NEGATIVE
Ketones, ur: 5 mg/dL — AB
Nitrite: NEGATIVE
Protein, ur: 30 mg/dL — AB
Specific Gravity, Urine: 1.03 (ref 1.005–1.030)
pH: 6 (ref 5.0–8.0)

## 2021-09-26 LAB — CBC WITH DIFFERENTIAL/PLATELET
Abs Immature Granulocytes: 0.03 10*3/uL (ref 0.00–0.07)
Basophils Absolute: 0 10*3/uL (ref 0.0–0.1)
Basophils Relative: 1 %
Eosinophils Absolute: 0.1 10*3/uL (ref 0.0–0.5)
Eosinophils Relative: 1 %
HCT: 38 % (ref 36.0–46.0)
Hemoglobin: 11.6 g/dL — ABNORMAL LOW (ref 12.0–15.0)
Immature Granulocytes: 1 %
Lymphocytes Relative: 12 %
Lymphs Abs: 0.7 10*3/uL (ref 0.7–4.0)
MCH: 26.5 pg (ref 26.0–34.0)
MCHC: 30.5 g/dL (ref 30.0–36.0)
MCV: 86.8 fL (ref 80.0–100.0)
Monocytes Absolute: 0.6 10*3/uL (ref 0.1–1.0)
Monocytes Relative: 11 %
Neutro Abs: 4.5 10*3/uL (ref 1.7–7.7)
Neutrophils Relative %: 74 %
Platelets: 353 10*3/uL (ref 150–400)
RBC: 4.38 MIL/uL (ref 3.87–5.11)
RDW: 14.9 % (ref 11.5–15.5)
WBC: 6 10*3/uL (ref 4.0–10.5)
nRBC: 0 % (ref 0.0–0.2)

## 2021-09-26 LAB — COMPREHENSIVE METABOLIC PANEL WITH GFR
ALT: 17 U/L (ref 0–44)
AST: 31 U/L (ref 15–41)
Albumin: 3.6 g/dL (ref 3.5–5.0)
Alkaline Phosphatase: 50 U/L (ref 38–126)
Anion gap: 10 (ref 5–15)
BUN: 19 mg/dL (ref 8–23)
CO2: 21 mmol/L — ABNORMAL LOW (ref 22–32)
Calcium: 8.5 mg/dL — ABNORMAL LOW (ref 8.9–10.3)
Chloride: 101 mmol/L (ref 98–111)
Creatinine, Ser: 1.53 mg/dL — ABNORMAL HIGH (ref 0.44–1.00)
GFR, Estimated: 35 mL/min — ABNORMAL LOW
Glucose, Bld: 91 mg/dL (ref 70–99)
Potassium: 3.7 mmol/L (ref 3.5–5.1)
Sodium: 132 mmol/L — ABNORMAL LOW (ref 135–145)
Total Bilirubin: 0.6 mg/dL (ref 0.3–1.2)
Total Protein: 7 g/dL (ref 6.5–8.1)

## 2021-09-26 LAB — LIPASE, BLOOD: Lipase: 41 U/L (ref 11–51)

## 2021-09-26 LAB — RESP PANEL BY RT-PCR (FLU A&B, COVID) ARPGX2
Influenza A by PCR: NEGATIVE
Influenza B by PCR: NEGATIVE
SARS Coronavirus 2 by RT PCR: NEGATIVE

## 2021-09-26 NOTE — ED Provider Notes (Signed)
Emergency Medicine Provider Triage Evaluation Note  [name] , a 80 y.o. female  was evaluated in triage.  Pt complains of uri sxs, nv, generalized weakness, fatigue  Review of Systems  Positive:  uri sxs, nv, generalized weakness, fatigue Negative: Diarrhea, fever  Physical Exam  BP (!) 92/55 (BP Location: Left Arm)    Pulse 78    Temp 97.9 F (36.6 C) (Oral)    Resp 16    Ht 5\' 4"  (1.626 m)    Wt 64.9 kg    SpO2 95%    BMI 24.55 kg/m  Gen:   Awake, no distress   Resp:  Normal effort  MSK:   Moves extremities without difficulty  Other:  Clear speech  Medical Decision Making  Medically screening exam initiated at 2:48 PM.  Appropriate orders placed.  [name] was informed that the remainder of the evaluation will be completed by another provider, this initial triage assessment does not replace that evaluation, and the importance of remaining in the ED until their evaluation is complete.     Melissa Booze, PA-C 09/26/21 1448    Melissa Rasmussen, MD 09/26/21 [phone]

## 2021-09-26 NOTE — ED Notes (Signed)
Ems called per provider  

## 2021-09-26 NOTE — Discharge Instructions (Addendum)
Patient sent to the hospital via EMS. 

## 2021-09-26 NOTE — ED Triage Notes (Signed)
Per EMS-Patient went to [facility]  with c/o weakness, cough, headache, fatigue, right ear pain, dizziness x 3 days. [facility] called EMS for a BP 80/48. EMS did 2 manual BP's - 110/70 and 130/82- HR-92- Sats 96% on room air and CBG-106.

## 2021-09-26 NOTE — ED Triage Notes (Signed)
Pt c/o emesis, cough, nasal congestion, headache, body aches, fatigue, right ear pain, dizzy, lightheaded.   Denies sore throat, chills, diarrhea, constipation.  Onset Wednesday. Says she thinks she has a UTI. No specific symptoms but states they are recurring.

## 2021-09-26 NOTE — ED Provider Notes (Addendum)
[facility]    CSN: [id number] Arrival date & time: 09/26/21  1133      History   Chief Complaint Chief Complaint  Patient presents with   Cough    HPI [name] is a 80 y.o. female.   Patient presents with nausea, vomiting, cough, right ear pain, nasal congestion, body aches, fatigue, dizziness that started a few days prior.  Denies chest pain, shortness of breath, diarrhea, abdominal pain, sore throat.  Patient is also requesting testing for urinary tract infection but denies any current symptoms.  Patient reports that she wants to be checked to make sure as she recurrently has urinary tract infections with no symptoms.   Cough  Past Medical History:  Diagnosis Date   Anemia    Chronic bronchitis (HCC)    Coronary artery disease    Difficulty swallowing pills    Difficulty swallowing solids    GERD (gastroesophageal reflux disease)    H/O temporal arteritis 08/2014-01/2015   HA (headache)    "related to temporal arteritis 08/2014-01/2015"   Hypercholesteremia    Hypertension    Hypoxia    Kidney stones    Left carotid artery stenosis    Myocardial infarct ([place]) 1999; 2001   On home oxygen therapy    "2L at night; don't always use it" (11/12/2015)   Pneumonia 1970s X 1; ~ 2006; ~ 2014   PVD (peripheral vascular disease) ([place])    Rheumatoid arthritis ([place])    Syncope and collapse 10/05/2015    Patient Active Problem List   Diagnosis Date Noted   Hypercholesterolemia 11/13/2019   Heart disease 11/13/2019   Gastroesophageal reflux disease 11/13/2019   Degeneration of lumbar intervertebral disc 11/13/2019   Lumbar adjacent segment disease with spondylolisthesis 11/13/2019   Lumbar radiculopathy 11/13/2019   Myocardial infarction ([place]) 11/13/2019   Vitamin B6 deficiency 11/13/2019   Rheumatism 11/13/2019   Temporal arteritis ([place]) 11/12/2015   Claudication ([place]) 11/12/2015   Claudication in peripheral vascular disease ([place]) 10/07/2015   Right  hip pain 04/10/2015   HA (headache)     Past Surgical History:  Procedure Laterality Date   CAROTID ANGIOGRAM N/A 10/10/2013   Procedure: CAROTID ANGIOGRAM;  Surgeon: Laverda Page, MD;  Location: [facility] C[facility] LAB;  Service: Cardiovascular;  Laterality: N/A;   CAROTID ENDARTERECTOMY Left 2000   CATARACT EXTRACTION W/ INTRAOCULAR LENS  IMPLANT, BILATERAL Bilateral    CORONARY ANGIOPLASTY WITH STENT PLACEMENT  1999   CYSTOSCOPY  X 2   "after I passed kidney stones"   DILATION AND CURETTAGE OF UTERUS     FEMORAL ARTERY STENT Right 2009   SFA   ILIAC ARTERY STENT Left    ILIAC ARTERY STENT Bilateral 11/12/2015   "left external; right lower iliac"   LOWER EXTREMITY ANGIOGRAPHY N/A 04/13/2017   Procedure: Lower Extremity Angiography;  Surgeon: Adrian Prows, MD;  Location: [place] CV LAB;  Service: Cardiovascular;  Laterality: N/A;   PERIPHERAL VASCULAR CATHETERIZATION N/A 11/12/2015   Procedure: Lower Extremity Angiography;  Surgeon: Adrian Prows, MD;  Location: [place] [facility];  Service: Cardiovascular;  Laterality: N/A;   SUBCLAVIAN ARTERY STENT Left 2000   Possible same time Right SFA stent/notes 11/10/2015   TUBAL LIGATION     YAG LASER APPLICATION Left    "for floaters"    OB History   No obstetric history on file.      Home Medications    Prior to Admission medications   Medication Sig Start Date End  Date Taking? Authorizing Provider  aspirin 81 MG tablet Take 162 mg by mouth daily.     [provider]  bisoprolol (ZEBETA) 10 MG tablet Take 1 tablet by mouth once daily 07/10/21   Adrian Prows, MD  cetirizine (ZYRTEC) 10 MG tablet Take 10 mg by mouth daily as needed for allergies.    [provider]  Cholecalciferol (VITAMIN D) 2000 UNITS CAPS Take 2,000 Units by mouth daily.    [provider]  Coenzyme Q10 (COQ10) 200 MG CAPS Take 1 capsule by mouth daily.    [provider]  Cyanocobalamin (VITAMIN B12) 1000 MCG TBCR Take 1 tablet by mouth  daily.    [provider]  losartan (COZAAR) 25 MG tablet Take 12.5 mg by mouth daily. 1/2 tablet daily    [provider]  pantoprazole (PROTONIX) 40 MG tablet Take 40 mg by mouth daily.    [provider]  predniSONE (DELTASONE) 5 MG tablet Take 5 mg by mouth daily.     [provider]  Probiotic Product (PROBIOTIC DAILY PO) Take 1 capsule by mouth daily.    [provider]  Pyridoxine HCl (VITAMIN B6) 250 MG TABS Take 1 tablet by mouth daily.    [provider]  REPATHA SURECLICK 174 MG/ML SOAJ Inject 1 Syringe into the skin every 14 (fourteen) days. 06/25/20   Adrian Prows, MD    Family History Family History  Problem Relation Age of Onset   Diabetes Mother    Hypertension Mother    Breast cancer Sister    Heart attack Father     Social History Social History   Tobacco Use   Smoking status: Former    Packs/day: 2.00    Years: 42.00    Pack years: 84.00    Types: Cigarettes    Quit date: 2001    Years since quitting: 21.9   Smokeless tobacco: Never   Tobacco comments:    Quit in 2000  Vaping Use   Vaping Use: Never used  Substance Use Topics   Alcohol use: No    Alcohol/week: 0.0 standard drinks   Drug use: No     Allergies   Bactrim [sulfamethoxazole-trimethoprim], Sulfa antibiotics, Crestor [rosuvastatin], Ibuprofen, Ivp dye [iodinated diagnostic agents], Lipitor [atorvastatin], Macrodantin, Naprosyn [naproxen], Niaspan [niacin er], Vioxx [rofecoxib], Zetia [ezetimibe], Bacitracin, Colesevelam hcl, Fluvastatin sodium, Meloxicam, Tramadol hcl, Ciprofloxacin, Clarithromycin, Neosporin [neomycin-bacitracin zn-polymyx], Phenergan [promethazine hcl], Pletal [cilostazol], and Ramipril   Review of Systems Review of Systems Per HPI  Physical Exam Triage Vital Signs ED Triage Vitals  Enc Vitals Group     BP 09/26/21 1157 (!) 80/48     Pulse Rate 09/26/21 1157 79     Resp 09/26/21 1157 18     Temp 09/26/21 1157 97.9  F (36.6 C)     Temp Source 09/26/21 1157 Oral     SpO2 09/26/21 1157 98 %     Weight --      Height --      Head Circumference --      Peak Flow --      Pain Score 09/26/21 1159 0     Pain Loc --      Pain Edu? --      Excl. in [place]? --    No data found.  Updated Vital Signs BP 111/70    Pulse 79    Temp 97.9 F (36.6 C) (Oral)    Resp 18    SpO2 98%  Visual Acuity Right Eye Distance:   Left Eye Distance:   Bilateral Distance:    Right Eye Near:   Left Eye Near:    Bilateral Near:     Physical Exam Constitutional:      General: She is not in acute distress.    Appearance: Normal appearance. She is not toxic-appearing or diaphoretic.  HENT:     Head: Normocephalic and atraumatic.     Right Ear: Tympanic membrane and ear canal normal.     Left Ear: Tympanic membrane and ear canal normal.     Nose: Congestion present.     Mouth/Throat:     Mouth: Mucous membranes are moist.     Pharynx: No posterior oropharyngeal erythema.  Eyes:     Extraocular Movements: Extraocular movements intact.     Conjunctiva/sclera: Conjunctivae normal.     Pupils: Pupils are equal, round, and reactive to light.  Cardiovascular:     Rate and Rhythm: Normal rate and regular rhythm.     Pulses: Normal pulses.     Heart sounds: Normal heart sounds.  Pulmonary:     Effort: Pulmonary effort is normal. No respiratory distress.     Breath sounds: No stridor. Rhonchi present. No wheezing or rales.  Abdominal:     General: Abdomen is flat. Bowel sounds are normal.     Palpations: Abdomen is soft.  Musculoskeletal:        General: Normal range of motion.     Cervical back: Normal range of motion.  Skin:    General: Skin is warm and dry.  Neurological:     General: No focal deficit present.     Mental Status: She is alert and oriented to person, place, and time. Mental status is at baseline.  Psychiatric:        Mood and Affect: Mood normal.        Behavior: Behavior normal.     UC  Treatments / Results  Labs (all labs ordered are listed, but only abnormal results are displayed) Labs Reviewed - No data to display  EKG   Radiology No results found.  Procedures Procedures (including critical care time)  Medications Ordered in UC Medications - No data to display  Initial Impression / Assessment and Plan / UC Course  I have reviewed the triage vital signs and the nursing notes.  Pertinent labs & imaging results that were available during my care of the patient were reviewed by me and considered in my medical decision making (see chart for details).     It appears the patient may have a viral upper respiratory infection with cough.  Although, patient's blood pressure was 80 systolic on arrival to triage.  Advised patient that she will need to go to the hospital for further evaluation and management given this vital sign reading with associated dizziness and adventitious lung sounds.  Patient was agreeable with plan.  Patient left via EMS.  Will defer all other evaluation to the ED given that this needs prompt evaluation. Final Clinical Impressions(s) / UC Diagnoses   Final diagnoses:  Low blood pressure reading  Viral upper respiratory tract infection with cough  Dizziness and giddiness  Abnormal lung sounds     Discharge Instructions      Patient sent to the hospital via EMS.    ED Prescriptions   None    PDMP not reviewed this encounter.   Teodora Medici, [place] 09/26/21 [place], [place], [place] 09/26/21 [phone]

## 2021-09-27 ENCOUNTER — Other Ambulatory Visit: Payer: Self-pay

## 2021-09-27 ENCOUNTER — Emergency Department (HOSPITAL_BASED_OUTPATIENT_CLINIC_OR_DEPARTMENT_OTHER)
Admission: EM | Admit: 2021-09-27 | Discharge: 2021-09-27 | Disposition: A | Discharge status: Home / Self Care | Payer: Medicare Other | Source: Home / Self Care | Attending: Emergency Medicine | Admitting: Emergency Medicine

## 2021-09-27 ENCOUNTER — Encounter (HOSPITAL_BASED_OUTPATIENT_CLINIC_OR_DEPARTMENT_OTHER): Payer: Self-pay | Admitting: Emergency Medicine

## 2021-09-27 DIAGNOSIS — R059 Cough, unspecified: Secondary | ICD-10-CM | POA: Insufficient documentation

## 2021-09-27 DIAGNOSIS — R42 Dizziness and giddiness: Secondary | ICD-10-CM | POA: Insufficient documentation

## 2021-09-27 DIAGNOSIS — I959 Hypotension, unspecified: Secondary | ICD-10-CM | POA: Insufficient documentation

## 2021-09-27 DIAGNOSIS — J069 Acute upper respiratory infection, unspecified: Secondary | ICD-10-CM

## 2021-09-27 DIAGNOSIS — N179 Acute kidney failure, unspecified: Secondary | ICD-10-CM | POA: Diagnosis not present

## 2021-09-27 DIAGNOSIS — R300 Dysuria: Secondary | ICD-10-CM | POA: Diagnosis not present

## 2021-09-27 DIAGNOSIS — R0981 Nasal congestion: Secondary | ICD-10-CM | POA: Insufficient documentation

## 2021-09-27 DIAGNOSIS — I2510 Atherosclerotic heart disease of native coronary artery without angina pectoris: Secondary | ICD-10-CM | POA: Insufficient documentation

## 2021-09-27 DIAGNOSIS — Z79899 Other long term (current) drug therapy: Secondary | ICD-10-CM | POA: Insufficient documentation

## 2021-09-27 DIAGNOSIS — E860 Dehydration: Secondary | ICD-10-CM | POA: Diagnosis not present

## 2021-09-27 DIAGNOSIS — I10 Essential (primary) hypertension: Secondary | ICD-10-CM | POA: Insufficient documentation

## 2021-09-27 DIAGNOSIS — M791 Myalgia, unspecified site: Secondary | ICD-10-CM | POA: Insufficient documentation

## 2021-09-27 DIAGNOSIS — Z87891 Personal history of nicotine dependence: Secondary | ICD-10-CM | POA: Insufficient documentation

## 2021-09-27 DIAGNOSIS — R0602 Shortness of breath: Secondary | ICD-10-CM | POA: Diagnosis not present

## 2021-09-27 DIAGNOSIS — R5383 Other fatigue: Secondary | ICD-10-CM | POA: Insufficient documentation

## 2021-09-27 DIAGNOSIS — R110 Nausea: Secondary | ICD-10-CM | POA: Insufficient documentation

## 2021-09-27 DIAGNOSIS — Z7982 Long term (current) use of aspirin: Secondary | ICD-10-CM | POA: Insufficient documentation

## 2021-09-27 DIAGNOSIS — R531 Weakness: Secondary | ICD-10-CM | POA: Diagnosis not present

## 2021-09-27 MED ORDER — BENZONATATE 100 MG PO CAPS: 100.0000 mg | ORAL_CAPSULE | Freq: Three times a day (TID) | ORAL | 0 refills | Status: DC | PRN

## 2021-09-27 MED ORDER — ONDANSETRON HCL 4 MG PO TABS
4.0000 mg | ORAL_TABLET | Freq: Once | ORAL | 0 refills | Status: AC
Start: 2021-09-27 — End: 2021-09-27

## 2021-09-27 MED ORDER — SODIUM CHLORIDE 0.9 % IV BOLUS
1000.0000 mL | Freq: Once | INTRAVENOUS | Status: AC
  Administered 2021-09-27: 1000 mL via INTRAVENOUS

## 2021-09-27 MED ORDER — ONDANSETRON HCL 4 MG/2ML IJ SOLN
4.0000 mg | Freq: Once | INTRAMUSCULAR | Status: DC
  Filled 2021-09-27: qty 2

## 2021-09-27 NOTE — ED Provider Notes (Signed)
[facility] EM[facility] DEPART[facility] Provider Note   CSN: [id number] Arrival date & time: 09/27/21  1522     History Chief Complaint  Patient presents with   Hypotension    [name] is a 80 y.o. female.  HPI 35 year with PMHx below presenting after being evaluated in urgent care and [facility] ED yesterday and [facility] urgent care today for URI symptoms including nausea, cough, nasal congestion, myalgias, fatigue, and dizziness. She reports poor PO intake x 3-5 days 2/2 decreased appetite. She denies recent vomiting, dysuria, abdominal pain, suprapubic pain, diarrhea, and confusion. No SHOB, CP, headache, changes in vision, recent falls or trauma. She has been taking OTC cough and cold medications and patient and daughter state that symptoms have been improving since onset ~3 days ago. She has has several labs, UA, and CXR yesterday at [facility].  She is counseled on importance of proper PO intake and she expresses understanding.     Past Medical History:  Diagnosis Date   Anemia    Chronic bronchitis (HCC)    Coronary artery disease    Difficulty swallowing pills    Difficulty swallowing solids    GERD (gastroesophageal reflux disease)    H/O temporal arteritis 08/2014-01/2015   HA (headache)    "related to temporal arteritis 08/2014-01/2015"   Hypercholesteremia    Hypertension    Hypoxia    Kidney stones    Left carotid artery stenosis    Myocardial infarct ([place]) 1999; 2001   On home oxygen therapy    "2L at night; don't always use it" (11/12/2015)   Pneumonia 1970s X 1; ~ 2006; ~ 2014   PVD (peripheral vascular disease) ([place])    Rheumatoid arthritis ([place])    Syncope and collapse 10/05/2015    Patient Active Problem List   Diagnosis Date Noted   Hypercholesterolemia 11/13/2019   Heart disease 11/13/2019   Gastroesophageal reflux disease 11/13/2019   Degeneration of lumbar intervertebral disc 11/13/2019   Lumbar adjacent segment disease with spondylolisthesis  11/13/2019   Lumbar radiculopathy 11/13/2019   Myocardial infarction ([place]) 11/13/2019   Vitamin B6 deficiency 11/13/2019   Rheumatism 11/13/2019   Temporal arteritis ([place]) 11/12/2015   Claudication ([place]) 11/12/2015   Claudication in peripheral vascular disease ([place]) 10/07/2015   Right hip pain 04/10/2015   HA (headache)     Past Surgical History:  Procedure Laterality Date   CAROTID ANGIOGRAM N/A 10/10/2013   Procedure: CAROTID ANGIOGRAM;  Surgeon: Laverda Page, MD;  Location: [facility] CATH LAB;  Service: Cardiovascular;  Laterality: N/A;   CAROTID ENDARTERECTOMY Left 2000   CATARACT EXTRACTION W/ INTRAOCULAR LENS  IMPLANT, BILATERAL Bilateral    CORONARY ANGIOPLASTY WITH STENT PLACEMENT  1999   CYSTOSCOPY  X 2   "after I passed kidney stones"   DILATION AND CURETTAGE OF UTERUS     FEMORAL ARTERY STENT Right 2009   SFA   ILIAC ARTERY STENT Left    ILIAC ARTERY STENT Bilateral 11/12/2015   "left external; right lower iliac"   LOWER EXTREMITY ANGIOGRAPHY N/A 04/13/2017   Procedure: Lower Extremity Angiography;  Surgeon: Adrian Prows, MD;  Location: [place] [facility];  Service: Cardiovascular;  Laterality: N/A;   PERIPHERAL VASCULAR CATHETERIZATION N/A 11/12/2015   Procedure: Lower Extremity Angiography;  Surgeon: Adrian Prows, MD;  Location: [place] CV LAB;  Service: Cardiovascular;  Laterality: N/A;   SUBCLAVIAN ARTERY STENT Left 2000   Possible same time Right SFA stent/notes 11/10/2015   TUBAL LIGATION  YAG LASER APPLICATION Left    "for floaters"     OB History   No obstetric history on file.     Family History  Problem Relation Age of Onset   Diabetes Mother    Hypertension Mother    Breast cancer Sister    Heart attack Father     Social History   Tobacco Use   Smoking status: Former    Packs/day: 2.00    Years: 42.00    Pack years: 84.00    Types: Cigarettes    Quit date: 2001    Years since quitting: 21.9   Smokeless tobacco: Never   Tobacco comments:     Quit in 2000  Vaping Use   Vaping Use: Never used  Substance Use Topics   Alcohol use: No    Alcohol/week: 0.0 standard drinks   Drug use: No    Home Medications Prior to Admission medications   Medication Sig Start Date End Date Taking? Authorizing Provider  aspirin 81 MG tablet Take 162 mg by mouth daily.     [provider]  bisoprolol (ZEBETA) 10 MG tablet Take 1 tablet by mouth once daily 07/10/21   Adrian Prows, MD  cetirizine (ZYRTEC) 10 MG tablet Take 10 mg by mouth daily as needed for allergies.    [provider]  Cholecalciferol (VITAMIN D) 2000 UNITS CAPS Take 2,000 Units by mouth daily.    [provider]  Coenzyme Q10 (COQ10) 200 MG CAPS Take 1 capsule by mouth daily.    [provider]  Cyanocobalamin (VITAMIN B12) 1000 MCG TBCR Take 1 tablet by mouth daily.    [provider]  losartan (COZAAR) 25 MG tablet Take 12.5 mg by mouth daily. 1/2 tablet daily    [provider]  pantoprazole (PROTONIX) 40 MG tablet Take 40 mg by mouth daily.    [provider]  predniSONE (DELTASONE) 5 MG tablet Take 5 mg by mouth daily.     [provider]  Probiotic Product (PROBIOTIC DAILY PO) Take 1 capsule by mouth daily.    [provider]  Pyridoxine HCl (VITAMIN B6) 250 MG TABS Take 1 tablet by mouth daily.    [provider]  REPATHA SURECLICK 099 MG/ML SOAJ Inject 1 Syringe into the skin every 14 (fourteen) days. 06/25/20   Adrian Prows, MD    Allergies    Bactrim [sulfamethoxazole-trimethoprim], Sulfa antibiotics, Crestor [rosuvastatin], Ibuprofen, Ivp dye [iodinated diagnostic agents], Lipitor [atorvastatin], Macrodantin, Naprosyn [naproxen], Niaspan [niacin er], Vioxx [rofecoxib], Zetia [ezetimibe], Bacitracin, Colesevelam hcl, Fluvastatin sodium, Meloxicam, Tramadol hcl, Ciprofloxacin, Clarithromycin, Neosporin [neomycin-bacitracin zn-polymyx], Phenergan [promethazine hcl], Pletal [cilostazol],  and Ramipril  Review of Systems   Review of Systems  Constitutional:  Positive for fatigue. Negative for fever.  HENT:  Positive for congestion and rhinorrhea.   Respiratory:  Positive for cough. Negative for chest tightness and shortness of breath.   Cardiovascular:  Negative for chest pain and leg swelling.  Gastrointestinal:  Negative for abdominal distention, abdominal pain, constipation and vomiting.  Endocrine: Negative for polyuria.  Genitourinary:  Positive for difficulty urinating. Negative for dysuria, flank pain and frequency.  Musculoskeletal:  Positive for myalgias.  Neurological:  Positive for weakness. Negative for light-headedness and headaches.  Psychiatric/Behavioral:  Negative for confusion.    Physical Exam Updated Vital Signs BP 138/63 (BP Location: Right Arm)    Pulse 87    Temp 97.7 F (36.5 C) (Oral)    Resp (!) 24    Ht  5\' 4"  (1.626 m)    Wt 64 kg    SpO2 100%    BMI 24.20 kg/m   Physical Exam Constitutional:      General: She is not in acute distress.    Appearance: Normal appearance. She is normal weight. She is not ill-appearing.  HENT:     Head: Normocephalic and atraumatic.     Nose: Congestion present.  Eyes:     Pupils: Pupils are equal, round, and reactive to light.  Cardiovascular:     Rate and Rhythm: Normal rate and regular rhythm.     Pulses: Normal pulses.     Heart sounds: Normal heart sounds.  Pulmonary:     Effort: Pulmonary effort is normal. No respiratory distress.     Breath sounds: Normal breath sounds. No wheezing.  Abdominal:     General: Abdomen is flat.     Palpations: Abdomen is soft.  Musculoskeletal:     Cervical back: Normal range of motion.  Skin:    General: Skin is warm and dry.  Neurological:     General: No focal deficit present.     Mental Status: She is alert and oriented to person, place, and time. Mental status is at baseline.  Psychiatric:        Behavior: Behavior normal.    ED Results / Procedures /  Treatments   Labs (all labs ordered are listed, but only abnormal results are displayed) Labs Reviewed - No data to display  EKG EKG Interpretation  Date/Time:  Saturday September 27 2021 15:38:33 EST Ventricular Rate:  82 PR Interval:  109 QRS Duration: 90 QT Interval:  374 QTC Calculation: 437 R Axis:   46 Text Interpretation: Sinus rhythm Ventricular premature complex Short PR interval Borderline ST depression, diffuse leads Baseline wander TECHNICALLY DIFFICULT Confirmed by Deno Etienne [phone]) on 09/27/2021 3:45:48 PM  Radiology DG CHEST PORT 1 VIEW  Result Date: 09/26/2021 CLINICAL DATA:  Cough. EXAM: PORTABLE CHEST 1 VIEW COMPARISON:  12/23/2020 FINDINGS: The cardiomediastinal silhouette is unchanged with normal heart size. Aortic atherosclerosis is noted. A vascular stent projects over the superior mediastinum. There is slight elevation of the left hemidiaphragm. No confluent airspace opacity, edema, sizable pleural effusion, or pneumothorax is identified. Surgical clips are noted in the left neck. No acute osseous abnormality is seen. IMPRESSION: No active disease. Electronically Signed   By: Logan Bores M.D.   On: 09/26/2021 15:13    Procedures Procedures   Medications Ordered in ED Medications - No data to display  ED Course  I have reviewed the triage vital signs and the nursing notes.  Pertinent labs & imaging results that were available during my care of the patient were reviewed by me and considered in my medical decision making (see chart for details).    MDM Rules/Calculators/A&P                         Labs and imaging obtained at [facility] ED yesterday. COVID and Influenza negative, CXR negative for cardiopulmonary disease, CMP with AKI of 1.53 with Na 132 in s/o poor PO intake and intermittent nausea. UA with trace leukocytes and rare bacteria. Overall low clinical suspicion for UTI and U/A not convincing; she denies abdominal or suprapubic pain, dysuria, and  urinary frequency. She was hypotensive yesterday at [facility] with SBP 80's. Vitals stable today with SBP ranging 120-150s and HR 87. She remains A&O x 4, denies SHOB, CP, and lightheadedness. She is  euvolemic on exam with no abdominal or suprapubic pain. She reports feeling much better since onset of sxs 3 days ago with OTC cough and cold medications. She is counseled on the importance of PO intake to prevent dehydration which can result in falls and trauma; she expresses understanding and reports she will maintain appropriate PO intake. 1L bolus of IVF given; Zofran offered, pt declined as she reports not feeling nauseous. Pt reports feeling "much better" after receiving fluids. Stable for d/c; prescribed tessalon and Zofran at time of d/c. Strict return precautions provided and pt reports understanding.      Final Clinical Impression(s) / ED Diagnoses Final diagnoses:  None    Rx / DC Orders ED Discharge Orders     None        Lajean Manes, MD 09/27/21 [place], [place], DO 09/27/21 1755

## 2021-09-27 NOTE — ED Notes (Signed)
Pt discharged to home. Discharge instructions have been discussed with patient and/or family members. Pt verbally acknowledges understanding d/c instructions, and endorses comprehension to checkout at registration before leaving.  °

## 2021-09-27 NOTE — ED Notes (Signed)
Pt stated that she will be unable to wait any longer and is leaving.

## 2021-09-27 NOTE — ED Triage Notes (Signed)
Pt arrives pov, reports feeling bad x 4 days. Treated at [facility], reports hypotension. Was sent to [facility] by EMS. Left AMA d/t not being seen. Was treated at [facility] today, reports orthostatic hypotension.  Pt denies CP, endorses shob with activity

## 2021-09-27 NOTE — ED Notes (Signed)
ED Provider at bedside. 

## 2021-09-27 NOTE — ED Notes (Signed)
Pt ambulatory with steady gait, standby assist to bathroom

## 2021-09-27 NOTE — Discharge Instructions (Addendum)
Please schedule an appointment with your regular provider next week.  Please continue taking over the counter cough and cold medications  Please take zofran for nausea and tessalon for cough as needed.

## 2021-09-29 ENCOUNTER — Emergency Department (HOSPITAL_BASED_OUTPATIENT_CLINIC_OR_DEPARTMENT_OTHER): Payer: Medicare Other

## 2021-09-29 ENCOUNTER — Emergency Department (HOSPITAL_BASED_OUTPATIENT_CLINIC_OR_DEPARTMENT_OTHER)
Admission: EM | Admit: 2021-09-29 | Discharge: 2021-09-29 | Disposition: A | Discharge status: Home / Self Care | Payer: Medicare Other | Attending: Emergency Medicine | Admitting: Emergency Medicine

## 2021-09-29 ENCOUNTER — Other Ambulatory Visit: Payer: Self-pay

## 2021-09-29 ENCOUNTER — Encounter (HOSPITAL_BASED_OUTPATIENT_CLINIC_OR_DEPARTMENT_OTHER): Payer: Self-pay

## 2021-09-29 DIAGNOSIS — R059 Cough, unspecified: Secondary | ICD-10-CM | POA: Diagnosis not present

## 2021-09-29 DIAGNOSIS — Z955 Presence of coronary angioplasty implant and graft: Secondary | ICD-10-CM | POA: Diagnosis not present

## 2021-09-29 DIAGNOSIS — I2510 Atherosclerotic heart disease of native coronary artery without angina pectoris: Secondary | ICD-10-CM | POA: Insufficient documentation

## 2021-09-29 DIAGNOSIS — J3489 Other specified disorders of nose and nasal sinuses: Secondary | ICD-10-CM | POA: Diagnosis not present

## 2021-09-29 DIAGNOSIS — J069 Acute upper respiratory infection, unspecified: Secondary | ICD-10-CM | POA: Diagnosis not present

## 2021-09-29 DIAGNOSIS — R42 Dizziness and giddiness: Secondary | ICD-10-CM | POA: Diagnosis not present

## 2021-09-29 DIAGNOSIS — Z7982 Long term (current) use of aspirin: Secondary | ICD-10-CM | POA: Diagnosis not present

## 2021-09-29 DIAGNOSIS — Z79899 Other long term (current) drug therapy: Secondary | ICD-10-CM | POA: Diagnosis not present

## 2021-09-29 DIAGNOSIS — I10 Essential (primary) hypertension: Secondary | ICD-10-CM | POA: Insufficient documentation

## 2021-09-29 DIAGNOSIS — B9789 Other viral agents as the cause of diseases classified elsewhere: Secondary | ICD-10-CM | POA: Diagnosis not present

## 2021-09-29 DIAGNOSIS — Z87891 Personal history of nicotine dependence: Secondary | ICD-10-CM | POA: Insufficient documentation

## 2021-09-29 DIAGNOSIS — R531 Weakness: Secondary | ICD-10-CM | POA: Diagnosis not present

## 2021-09-29 LAB — COMPREHENSIVE METABOLIC PANEL
ALT: 17 U/L (ref 0–44)
AST: 31 U/L (ref 15–41)
Albumin: 3.2 g/dL — ABNORMAL LOW (ref 3.5–5.0)
Alkaline Phosphatase: 51 U/L (ref 38–126)
Anion gap: 9 (ref 5–15)
BUN: 8 mg/dL (ref 8–23)
CO2: 25 mmol/L (ref 22–32)
Calcium: 8.6 mg/dL — ABNORMAL LOW (ref 8.9–10.3)
Chloride: 97 mmol/L — ABNORMAL LOW (ref 98–111)
Creatinine, Ser: 0.93 mg/dL (ref 0.44–1.00)
GFR, Estimated: >60 mL/min (ref >60)
Glucose, Bld: 99 mg/dL (ref 70–99)
Potassium: 3.7 mmol/L (ref 3.5–5.1)
Sodium: 131 mmol/L — ABNORMAL LOW (ref 135–145)
Total Bilirubin: 1.1 mg/dL (ref 0.3–1.2)
Total Protein: 6.8 g/dL (ref 6.5–8.1)

## 2021-09-29 LAB — CBC WITH DIFFERENTIAL/PLATELET
Abs Immature Granulocytes: 0.02 10*3/uL (ref 0.00–0.07)
Basophils Absolute: 0 10*3/uL (ref 0.0–0.1)
Basophils Relative: 0 %
Eosinophils Absolute: 0.1 10*3/uL (ref 0.0–0.5)
Eosinophils Relative: 1 %
HCT: 33.3 % — ABNORMAL LOW (ref 36.0–46.0)
Hemoglobin: 10.8 g/dL — ABNORMAL LOW (ref 12.0–15.0)
Immature Granulocytes: 0 %
Lymphocytes Relative: 17 %
Lymphs Abs: 0.8 10*3/uL (ref 0.7–4.0)
MCH: 26.5 pg (ref 26.0–34.0)
MCHC: 32.4 g/dL (ref 30.0–36.0)
MCV: 81.6 fL (ref 80.0–100.0)
Monocytes Absolute: 0.5 10*3/uL (ref 0.1–1.0)
Monocytes Relative: 11 %
Neutro Abs: 3.3 10*3/uL (ref 1.7–7.7)
Neutrophils Relative %: 71 %
Platelets: 286 10*3/uL (ref 150–400)
RBC: 4.08 MIL/uL (ref 3.87–5.11)
RDW: 14.6 % (ref 11.5–15.5)
WBC: 4.8 10*3/uL (ref 4.0–10.5)
nRBC: 0 % (ref 0.0–0.2)

## 2021-09-29 MED ORDER — SODIUM CHLORIDE 0.9 % IV BOLUS
1000.0000 mL | Freq: Once | INTRAVENOUS | Status: AC
  Administered 2021-09-29: 1000 mL via INTRAVENOUS

## 2021-09-29 NOTE — Discharge Instructions (Signed)
Please continue to take your medications as needed. Please schedule an appointment with your PCP for next available date.

## 2021-09-29 NOTE — ED Provider Notes (Signed)
[facility] EMERGENCY DEPARTMENT Provider Note   CSN: [id number] Arrival date & time: 09/29/21  1118     History Chief Complaint  Patient presents with   Cough    [name] is a 80 y.o. female seen in the ED 2 days prior for viral URI sxs presenting today for cough, fatigue and dizziness. Cough is non-productive; no hemoptysis. She denies having fevers. Imaging and labs leading up to today include negative COVID and flu, negative CXR on 12/16 , BMP with Cr 1.53 and Na 132, and UA non-convincing for UTI. She was treated with IVF on 12/17 and sent home with prescription for Zofran and Tessalon once she reported feeling better. She complains of similar sxs again today, most bothersome being cough and dizziness when she stands up. She reports drinking 1-2 bottles of water a day and eating jello and soups as tolerated due to diminished appetite.    Cough Associated symptoms: chills, diaphoresis and rhinorrhea   Associated symptoms: no chest pain, no fever and no headaches       Past Medical History:  Diagnosis Date   Anemia    Chronic bronchitis (HCC)    Coronary artery disease    Difficulty swallowing pills    Difficulty swallowing solids    GERD (gastroesophageal reflux disease)    H/O temporal arteritis 08/2014-01/2015   HA (headache)    "related to temporal arteritis 08/2014-01/2015"   Hypercholesteremia    Hypertension    Hypoxia    Kidney stones    Left carotid artery stenosis    Myocardial infarct ([place]) 1999; 2001   On home oxygen therapy    "2L at night; don't always use it" (11/12/2015)   Pneumonia 1970s X 1; ~ 2006; ~ 2014   PVD (peripheral vascular disease) ([place])    Rheumatoid arthritis ([place])    Syncope and collapse 10/05/2015    Patient Active Problem List   Diagnosis Date Noted   Hypercholesterolemia 11/13/2019   Heart disease 11/13/2019   Gastroesophageal reflux disease 11/13/2019   Degeneration of lumbar intervertebral disc 11/13/2019    Lumbar adjacent segment disease with spondylolisthesis 11/13/2019   Lumbar radiculopathy 11/13/2019   Myocardial infarction ([place]) 11/13/2019   Vitamin B6 deficiency 11/13/2019   Rheumatism 11/13/2019   Temporal arteritis ([place]) 11/12/2015   Claudication ([place]) 11/12/2015   Claudication in peripheral vascular disease ([place]) 10/07/2015   Right hip pain 04/10/2015   HA (headache)     Past Surgical History:  Procedure Laterality Date   CAROTID ANGIOGRAM N/A 10/10/2013   Procedure: CAROTID ANGIOGRAM;  Surgeon: Laverda Page, MD;  Location: [facility] CATH LAB;  Service: Cardiovascular;  Laterality: N/A;   CAROTID ENDARTERECTOMY Left 2000   CATARACT EXTRACTION W/ INTRAOCULAR LENS  IMPLANT, BILATERAL Bilateral    CORONARY ANGIOPLASTY WITH STENT PLACEMENT  1999   CYSTOSCOPY  X 2   "after I passed kidney stones"   DILATION AND CURETTAGE OF UTERUS     FEMORAL ARTERY STENT Right 2009   SFA   ILIAC ARTERY STENT Left    ILIAC ARTERY STENT Bilateral 11/12/2015   "left external; right lower iliac"   LOWER EXTREMITY ANGIOGRAPHY N/A 04/13/2017   Procedure: Lower Extremity Angiography;  Surgeon: Adrian Prows, MD;  Location: [facility] CV LAB;  Service: Cardiovascular;  Laterality: N/A;   PERIPHERAL VASCULAR CATHETERIZATION N/A 11/12/2015   Procedure: Lower Extremity Angiography;  Surgeon: Adrian Prows, MD;  Location: [facility] [place] CV LAB;  Service: Cardiovascular;  Laterality: N/A;   SUBCLAVIAN  ARTERY STENT Left 2000   Possible same time Right SFA stent/notes 11/10/2015   TUBAL LIGATION     YAG LASER APPLICATION Left    "for floaters"     OB History   No obstetric history on file.     Family History  Problem Relation Age of Onset   Diabetes Mother    Hypertension Mother    Breast cancer Sister    Heart attack Father     Social History   Tobacco Use   Smoking status: Former    Packs/day: 2.00    Years: 42.00    Pack years: 84.00    Types: Cigarettes    Quit date: 2001    Years since quitting:  21.9   Smokeless tobacco: Never   Tobacco comments:    Quit in 2000  Vaping Use   Vaping Use: Never used  Substance Use Topics   Alcohol use: No    Alcohol/week: 0.0 standard drinks   Drug use: No    Home Medications Prior to Admission medications   Medication Sig Start Date End Date Taking? Authorizing Provider  aspirin 81 MG tablet Take 162 mg by mouth daily.     [provider]  benzonatate (TESSALON) 100 MG capsule Take 1 capsule (100 mg total) by mouth 3 (three) times daily as needed for cough. 09/27/21   Lajean Manes, MD  bisoprolol (ZEBETA) 10 MG tablet Take 1 tablet by mouth once daily 07/10/21   Adrian Prows, MD  cetirizine (ZYRTEC) 10 MG tablet Take 10 mg by mouth daily as needed for allergies.    [provider]  Cholecalciferol (VITAMIN D) 2000 UNITS CAPS Take 2,000 Units by mouth daily.    [provider]  Coenzyme Q10 (COQ10) 200 MG CAPS Take 1 capsule by mouth daily.    [provider]  Cyanocobalamin (VITAMIN B12) 1000 MCG TBCR Take 1 tablet by mouth daily.    [provider]  losartan (COZAAR) 25 MG tablet Take 12.5 mg by mouth daily. 1/2 tablet daily    [provider]  pantoprazole (PROTONIX) 40 MG tablet Take 40 mg by mouth daily.    [provider]  predniSONE (DELTASONE) 5 MG tablet Take 5 mg by mouth daily.     [provider]  Probiotic Product (PROBIOTIC DAILY PO) Take 1 capsule by mouth daily.    [provider]  Pyridoxine HCl (VITAMIN B6) 250 MG TABS Take 1 tablet by mouth daily.    [provider]  REPATHA SURECLICK 194 MG/ML SOAJ Inject 1 Syringe into the skin every 14 (fourteen) days. 06/25/20   Adrian Prows, MD    Allergies    Bactrim [sulfamethoxazole-trimethoprim], Sulfa antibiotics, Crestor [rosuvastatin], Ibuprofen, Ivp dye [iodinated diagnostic agents], Lipitor [atorvastatin], Macrodantin, Naprosyn [naproxen], Niaspan [niacin er], Vioxx [rofecoxib], Zetia  [ezetimibe], Bacitracin, Colesevelam hcl, Fluvastatin sodium, Meloxicam, Tramadol hcl, Ciprofloxacin, Clarithromycin, Neosporin [neomycin-bacitracin zn-polymyx], Phenergan [promethazine hcl], Pletal [cilostazol], and Ramipril  Review of Systems   Review of Systems  Constitutional:  Positive for appetite change, chills, diaphoresis and fatigue. Negative for fever.  HENT:  Positive for congestion and rhinorrhea.   Eyes:  Negative for visual disturbance.  Respiratory:  Positive for cough.   Cardiovascular:  Negative for chest pain, palpitations and leg swelling.  Gastrointestinal:  Negative for abdominal pain, constipation, diarrhea, nausea and vomiting.  Musculoskeletal:  Negative for arthralgias.  Neurological:  Positive for dizziness and weakness. Negative for tremors, light-headedness, numbness and headaches.  Psychiatric/Behavioral: Negative.  Physical Exam Updated Vital Signs BP 134/85    Pulse 92    Temp 97.7 F (36.5 C) (Oral)    Resp 16    SpO2 99%   Physical Exam Constitutional:      Appearance: Normal appearance. She is normal weight.  HENT:     Head: Normocephalic and atraumatic.     Mouth/Throat:     Pharynx: Oropharynx is clear.  Eyes:     Extraocular Movements: Extraocular movements intact.     Pupils: Pupils are equal, round, and reactive to light.  Cardiovascular:     Rate and Rhythm: Normal rate and regular rhythm.     Pulses: Normal pulses.     Heart sounds: Normal heart sounds.  Pulmonary:     Effort: Pulmonary effort is normal. No respiratory distress.     Breath sounds: Normal breath sounds. No wheezing.  Abdominal:     General: Abdomen is flat. There is no distension.     Palpations: Abdomen is soft.     Tenderness: There is no abdominal tenderness. There is no guarding.  Musculoskeletal:        General: No swelling or tenderness.     Cervical back: Normal range of motion and neck supple.  Skin:    General: Skin is warm and dry.  Neurological:      General: No focal deficit present.     Mental Status: She is alert and oriented to person, place, and time. Mental status is at baseline.  Psychiatric:        Mood and Affect: Mood normal.        Behavior: Behavior normal.    ED Results / Procedures / Treatments   Labs (all labs ordered are listed, but only abnormal results are displayed) Labs Reviewed - No data to display  EKG None  Radiology No results found.  Procedures Procedures   Medications Ordered in ED Medications - No data to display  ED Course  I have reviewed the triage vital signs and the nursing notes.  Pertinent labs & imaging results that were available during my care of the patient were reviewed by me and considered in my medical decision making (see chart for details).  Clinical Course as of 09/29/21 1551  Mon Sep 29, 2021  1550 Comprehensive metabolic panel [MP]  2122 CBC with Differential/Platelet [MP]    Clinical Course User Index [MP] Lajean Manes, MD   MDM Rules/Calculators/A&P                         Fourth visit to an acute care setting for viral URI sxs x 5 days. There was concern for dehydration during previous visit. Pt reported very poor PO intake during visit 2 days ago, and today she reports drinking 1-2 bottle of water and minimal solid foods. She reports that sxs of cough and dizziness are most bothersome. Vitals stable, and exam benging with lungs CTAB without wheezing or rales. CBC and CMP collected. Orthostatic vitals ordered. 1L IVF given, and CXR for superimposed bacterial PNA done.   CBC with Hgb 10.8 compared to 11.6 three days ago; values consistent with hemodilution given decrease in all 3 cell lines in s/o receiving IVF. CMP with stable hyponatremia 131 and low albumin 3.2. CXR negative. Orthostatics negative. Respiratory panel offered, pt declined. Pt reports feeling well after IVF.   Constellation of sxs continue to point towards viral infection. Given it is day 5 of sxs,  anticipate improvement  over the next week. She is educated on the expected course of viral URI sxs in elderly patients, especially post-viral cough, and advised to schedule a follow up appt with her PCP.        Final Clinical Impression(s) / ED Diagnoses Final diagnoses:  None    Rx / DC Orders ED Discharge Orders     None        Lajean Manes, MD 09/29/21 1737    Drenda Freeze, MD 10/03/21 [phone]

## 2021-09-29 NOTE — ED Triage Notes (Addendum)
Pt c/o cough, sweats, dizziness, fatigue-states sx started 12/14-seen at [facility] and ED x 2 for same-states she was dx "with a virus"/ feels worse-NAD-to triage in w/c

## 2021-09-29 NOTE — ED Notes (Signed)
No acute distress noted upon this RN's departure of patient. Verified discharge paperwork with name and DOB. Vital signs stable. Patient taken to checkout window. Discharge paperwork discussed with patient and patients son. No further questions voiced upon discharge.

## 2021-09-29 NOTE — ED Notes (Signed)
First contact with patient. Patient arrived via triage from home with complaints of cough x several days - Patient has been seen multiple times by multiple locations for same. Patient states her PCP would not see her and sent her to the ED. Patient states she is having episodes of dizziness. Strong nonproductive cough noted. Patient is A&OX 4. Respirations even/unlabored- Patient speaking in full sentences. Patient changed into gown and placed on monitor and call light within reach. Patient updated on plan of care. Will continue to monitor patient.

## 2021-10-08 DIAGNOSIS — R059 Cough, unspecified: Secondary | ICD-10-CM | POA: Diagnosis not present

## 2021-10-08 DIAGNOSIS — N644 Mastodynia: Secondary | ICD-10-CM | POA: Diagnosis not present

## 2021-10-14 DIAGNOSIS — N644 Mastodynia: Secondary | ICD-10-CM | POA: Diagnosis not present

## 2021-10-14 DIAGNOSIS — J988 Other specified respiratory disorders: Secondary | ICD-10-CM | POA: Diagnosis not present

## 2021-10-14 DIAGNOSIS — R059 Cough, unspecified: Secondary | ICD-10-CM | POA: Diagnosis not present

## 2021-10-22 DIAGNOSIS — I2510 Atherosclerotic heart disease of native coronary artery without angina pectoris: Secondary | ICD-10-CM | POA: Diagnosis not present

## 2021-10-22 DIAGNOSIS — I10 Essential (primary) hypertension: Secondary | ICD-10-CM | POA: Diagnosis not present

## 2021-10-22 DIAGNOSIS — M791 Myalgia, unspecified site: Secondary | ICD-10-CM | POA: Diagnosis not present

## 2021-10-22 DIAGNOSIS — E785 Hyperlipidemia, unspecified: Secondary | ICD-10-CM | POA: Diagnosis not present

## 2021-10-22 DIAGNOSIS — T466X5A Adverse effect of antihyperlipidemic and antiarteriosclerotic drugs, initial encounter: Secondary | ICD-10-CM | POA: Diagnosis not present

## 2021-10-24 ENCOUNTER — Other Ambulatory Visit: Payer: Self-pay | Admitting: Cardiology

## 2021-10-24 DIAGNOSIS — I2510 Atherosclerotic heart disease of native coronary artery without angina pectoris: Secondary | ICD-10-CM

## 2021-10-24 DIAGNOSIS — I10 Essential (primary) hypertension: Secondary | ICD-10-CM

## 2021-10-30 DIAGNOSIS — M1990 Unspecified osteoarthritis, unspecified site: Secondary | ICD-10-CM | POA: Diagnosis not present

## 2021-10-30 DIAGNOSIS — M5416 Radiculopathy, lumbar region: Secondary | ICD-10-CM | POA: Diagnosis not present

## 2021-10-30 DIAGNOSIS — I6529 Occlusion and stenosis of unspecified carotid artery: Secondary | ICD-10-CM | POA: Diagnosis not present

## 2021-10-30 DIAGNOSIS — M316 Other giant cell arteritis: Secondary | ICD-10-CM | POA: Diagnosis not present

## 2021-10-30 DIAGNOSIS — E785 Hyperlipidemia, unspecified: Secondary | ICD-10-CM | POA: Diagnosis not present

## 2021-10-30 DIAGNOSIS — M7060 Trochanteric bursitis, unspecified hip: Secondary | ICD-10-CM | POA: Diagnosis not present

## 2021-10-30 DIAGNOSIS — I10 Essential (primary) hypertension: Secondary | ICD-10-CM | POA: Diagnosis not present

## 2021-10-30 DIAGNOSIS — I2510 Atherosclerotic heart disease of native coronary artery without angina pectoris: Secondary | ICD-10-CM | POA: Diagnosis not present

## 2021-10-30 DIAGNOSIS — I739 Peripheral vascular disease, unspecified: Secondary | ICD-10-CM | POA: Diagnosis not present

## 2021-10-30 DIAGNOSIS — M8589 Other specified disorders of bone density and structure, multiple sites: Secondary | ICD-10-CM | POA: Diagnosis not present

## 2021-10-30 DIAGNOSIS — R5383 Other fatigue: Secondary | ICD-10-CM | POA: Diagnosis not present

## 2021-10-30 DIAGNOSIS — N289 Disorder of kidney and ureter, unspecified: Secondary | ICD-10-CM | POA: Diagnosis not present

## 2021-11-05 ENCOUNTER — Encounter: Payer: Medicare Other | Admitting: Cardiology

## 2021-11-19 ENCOUNTER — Encounter: Payer: Self-pay | Admitting: Cardiology

## 2021-11-19 ENCOUNTER — Other Ambulatory Visit: Payer: Self-pay

## 2021-11-19 DIAGNOSIS — I6523 Occlusion and stenosis of bilateral carotid arteries: Secondary | ICD-10-CM

## 2021-11-19 DIAGNOSIS — Z95828 Presence of other vascular implants and grafts: Secondary | ICD-10-CM

## 2021-11-19 DIAGNOSIS — I739 Peripheral vascular disease, unspecified: Secondary | ICD-10-CM

## 2021-11-19 NOTE — Progress Notes (Signed)
Chief Complaint  Patient presents with   Research Consent     PREVAIL-ASCVD Trial     ICD-10-CM   1. PAD (peripheral artery disease) (HCC)  I73.9 PCV LOWER ARTERIAL (BILATERAL)    2. Asymptomatic bilateral carotid artery stenosis  I65.23     3. S/P insertion of iliac artery stent  Z95.828 PCV LOWER ARTERIAL (BILATERAL)

## 2021-12-19 DIAGNOSIS — H52203 Unspecified astigmatism, bilateral: Secondary | ICD-10-CM | POA: Diagnosis not present

## 2021-12-19 DIAGNOSIS — H18593 Other hereditary corneal dystrophies, bilateral: Secondary | ICD-10-CM | POA: Diagnosis not present

## 2021-12-19 DIAGNOSIS — H524 Presbyopia: Secondary | ICD-10-CM | POA: Diagnosis not present

## 2021-12-19 DIAGNOSIS — Z961 Presence of intraocular lens: Secondary | ICD-10-CM | POA: Diagnosis not present

## 2021-12-23 ENCOUNTER — Other Ambulatory Visit: Payer: Self-pay

## 2021-12-23 ENCOUNTER — Ambulatory Visit: Payer: Medicare Other

## 2021-12-23 DIAGNOSIS — I6523 Occlusion and stenosis of bilateral carotid arteries: Secondary | ICD-10-CM | POA: Diagnosis not present

## 2021-12-23 DIAGNOSIS — I739 Peripheral vascular disease, unspecified: Secondary | ICD-10-CM | POA: Diagnosis not present

## 2021-12-23 DIAGNOSIS — Z95828 Presence of other vascular implants and grafts: Secondary | ICD-10-CM | POA: Diagnosis not present

## 2021-12-24 ENCOUNTER — Other Ambulatory Visit: Payer: Medicare Other

## 2021-12-31 ENCOUNTER — Ambulatory Visit: Payer: Medicare Other | Admitting: Cardiology

## 2021-12-31 ENCOUNTER — Encounter: Payer: Self-pay | Admitting: Cardiology

## 2021-12-31 ENCOUNTER — Other Ambulatory Visit: Payer: Self-pay

## 2021-12-31 VITALS — BP 136/84 | HR 73 | Temp 97.8°F | Resp 16 | Ht 64.0 in | Wt 146.2 lb

## 2021-12-31 DIAGNOSIS — I2510 Atherosclerotic heart disease of native coronary artery without angina pectoris: Secondary | ICD-10-CM | POA: Diagnosis not present

## 2021-12-31 DIAGNOSIS — I739 Peripheral vascular disease, unspecified: Secondary | ICD-10-CM | POA: Diagnosis not present

## 2021-12-31 DIAGNOSIS — Z95828 Presence of other vascular implants and grafts: Secondary | ICD-10-CM | POA: Diagnosis not present

## 2021-12-31 DIAGNOSIS — I6523 Occlusion and stenosis of bilateral carotid arteries: Secondary | ICD-10-CM

## 2021-12-31 DIAGNOSIS — M48062 Spinal stenosis, lumbar region with neurogenic claudication: Secondary | ICD-10-CM

## 2021-12-31 NOTE — Progress Notes (Signed)
? ?Primary Physician/Referring:  Melissa Morning, DO ? ?Patient ID: [name], female    DOB: Nov 22, 1940, 81 y.o.   MRN: [id number] ? ?Chief Complaint  ?Patient presents with  ? PAD  ? Carotid Stenosis  ? Follow-up  ? ?HPI:   ? ?[name]  is a 81 y.o. Caucasian female with coronary artery disease and myocardial infarction in 1999 &  2001, left carotid endarterectomy in 2000, left iliac artery stenting and right SFA stenting and left subclavian artery stenting in the remote past and repeat stenting of bilateral external iliac arteries on  11/12/2015 with balloon expandable stent on the right and self-expanding stent on the left. Repeat peripheral arteriogram on 05/10/2017 which revealed widely patent stents. She is now on Repatha and tolerating this well, started this sometime in September 2020.   Past medical history significant for hypertension, hyperlipidemia, stage IIIa-B chronic kidney disease. ? ?She was visiting [place] for ASCVD clinical trial screening and complained of worsening symptoms of claudication and had obtained lower extremity arterial duplex and also her carotid surveillance and she now presents for follow-up. ? ?States that her activity has markedly decreased as she feels "tired".  Denies specifically dyspnea or chest pain or palpitations.  Continues to have severe back pain that limits her activities as well and pain shoots down her leg.    ? ?Past Medical History:  ?Diagnosis Date  ? Anemia   ? Chronic bronchitis ([place])   ? Coronary artery disease   ? Difficulty swallowing pills   ? Difficulty swallowing solids   ? GERD (gastroesophageal reflux disease)   ? H/O temporal arteritis 08/2014-01/2015  ? HA (headache)   ? "related to temporal arteritis 08/2014-01/2015"  ? Hypercholesteremia   ? Hypertension   ? Hypoxia   ? Kidney stones   ? Left carotid artery stenosis   ? Myocardial infarct [facility]) 1999; 2001  ? On home oxygen therapy   ? "2L at night; don't always use it" (11/12/2015)  ? Pneumonia  1970s X 1; ~ 2006; ~ 2014  ? PVD (peripheral vascular disease) ([place])   ? Rheumatoid arthritis ([place])   ? Syncope and collapse 10/05/2015  ? ?Social History  ? ?Tobacco Use  ? Smoking status: Former  ?  Packs/day: 2.00  ?  Years: 42.00  ?  Pack years: 84.00  ?  Types: Cigarettes  ?  Quit date: 2001  ?  Years since quitting: 22.2  ? Smokeless tobacco: Never  ? Tobacco comments:  ?  Quit in 2000  ?Substance Use Topics  ? Alcohol use: No  ?  Alcohol/week: 0.0 standard drinks  ?Marital status: Widowed ? ?ROS  ?Review of Systems  ?Cardiovascular:  Positive for claudication (mild, stable) and dyspnea on exertion. Negative for chest pain, leg swelling, orthopnea, paroxysmal nocturnal dyspnea and syncope.  ?Musculoskeletal:  Positive for muscle cramps.  ?Gastrointestinal:  Negative for melena.  ?Objective  ?Blood pressure 136/84, pulse 73, temperature 97.8 ?F (36.6 ?C), temperature source Temporal, resp. rate 16, height $RemoveBe'5\' 4"'dAkFbFFDF$  (1.626 m), weight 146 lb 3.2 oz (66.3 kg), SpO2 97 %.  ? ?  12/31/2021  ?  2:18 PM 12/31/2021  ?  2:17 PM 09/29/2021  ?  5:00 PM  ?Vitals with BMI  ?Height  $Remove'5\' 4"'ImXcihH$    ?Weight  146 lbs 3 oz   ?BMI  25.08   ?Systolic [phone]  ?Diastolic 84 75 75  ?Pulse 73 72 92  ?  ? Physical Exam ?Neck:  ?  Vascular: Carotid bruit (bilateral) present. No JVD.  ?Cardiovascular:  ?   Rate and Rhythm: Normal rate and regular rhythm.  ?   Pulses: Intact distal pulses.     ?     Femoral pulses are 0 on the right side with bruit and 0 on the left side with bruit. ?     Popliteal pulses are 0 on the right side and 1+ on the left side.  ?     Dorsalis pedis pulses are 2+ on the right side and 2+ on the left side.  ?     Posterior tibial pulses are 1+ on the right side and 0 on the left side.  ?   Heart sounds: Normal heart sounds. No murmur heard. ?  No gallop.  ?   Comments: No leg edema, no JVD. ?Pulmonary:  ?   Effort: Pulmonary effort is normal.  ?   Breath sounds: Rhonchi (bilateral diffuse) present.  ?Abdominal:  ?    General: Bowel sounds are normal.  ?   Palpations: Abdomen is soft.  ?Musculoskeletal:  ?   Right lower leg: No edema.  ?   Left lower leg: No edema.  ? ?Laboratory examination:  ? ? ?  Latest Ref Rng & Units 09/29/2021  ?  3:47 PM 09/26/2021  ?  3:23 PM 05/16/2008  ?  6:00 AM  ?CMP  ?Glucose 70 - 99 mg/dL 99   91   120    ?BUN 8 - 23 mg/dL $Remove'8   19   9    'urjXnrr$ ?Creatinine 0.44 - 1.00 mg/dL 0.93   1.53   0.74    ?Sodium 135 - 145 mmol/L 131   132   138    ?Potassium 3.5 - 5.1 mmol/L 3.7   3.7   4.5 HEMOLYZED SPECIMEN, RESULTS MAY BE AFFECTED    ?Chloride 98 - 111 mmol/L 97   101   107    ?CO2 22 - 32 mmol/L $RemoveB'25   21   25    'WMiWEhpm$ ?Calcium 8.9 - 10.3 mg/dL 8.6   8.5   9.0    ?Total Protein 6.5 - 8.1 g/dL 6.8   7.0     ?Total Bilirubin 0.3 - 1.2 mg/dL 1.1   0.6     ?Alkaline Phos 38 - 126 U/L 51   50     ?AST 15 - 41 U/L 31   31     ?ALT 0 - 44 U/L 17   17     ? ? ?  Latest Ref Rng & Units 09/29/2021  ?  3:47 PM 09/26/2021  ?  3:23 PM 05/16/2008  ?  6:00 AM  ?CBC  ?WBC 4.0 - 10.5 K/uL 4.8   6.0   6.9    ?Hemoglobin 12.0 - 15.0 g/dL 10.8   11.6   12.6    ?Hematocrit 36.0 - 46.0 % 33.3   38.0   37.1    ?Platelets 150 - 400 K/uL 286   353   327    ? ?External labs:  ? ?Labs 07/31/2021: ? ?BUN 9, creatinine 1.38, EGFR 37 mL, potassium 5.1. ? ?TSH normal at 3.87.  Vitamin D 73.1.  A1c 6.1%. ? ?Total cholesterol 177, triglycerides 143, HDL 75, LDL 73. ? ?Hb 11.9/HCT 37.3, platelets 435.  ? ?Medications and allergies  ? ?Allergies  ?Allergen Reactions  ? Bactrim [Sulfamethoxazole-Trimethoprim] Anaphylaxis  ? Sulfa Antibiotics Anaphylaxis  ? Crestor [Rosuvastatin] Cough  ?  "deathly ill"  ?  Ibuprofen Nausea And Vomiting  ? Ivp Dye [Iodinated Contrast Media] Nausea And Vomiting and Other (See Comments)  ?  "I may have had a rash too" "They normally give me benadryl and steroids before contrast dye"  ? Lipitor [Atorvastatin] Other (See Comments)  ?  Legs hurt so bad she couldn't walk  ? Macrodantin Nausea And Vomiting  ?  faint  ? Naprosyn  [Naproxen] Nausea Only and Other (See Comments)  ?  faint  ? Niaspan [Niacin Er] Other (See Comments)  ?  Flushing, headache  ? Vioxx [Rofecoxib] Other (See Comments)  ?  "deathly ill"  ? Zetia [Ezetimibe] Other (See Comments)  ?  Deathly ill   ? Bacitracin   ?  Other reaction(s): Unknown  ? Colesevelam Hcl   ?  Other reaction(s): Unknown  ? Fluvastatin Sodium   ?  Other reaction(s): Unknown  ? Meloxicam   ?  Can't remember  ? Tramadol Hcl Nausea Only  ? Ciprofloxacin Rash  ? Clarithromycin Rash  ? Neosporin [Neomycin-Bacitracin Zn-Polymyx] Rash  ? Phenergan [Promethazine Hcl] Rash  ? Pletal [Cilostazol] Other (See Comments)  ?  headache  ? Ramipril Cough  ?  ? ?Current Outpatient Medications:  ?  aspirin 81 MG tablet, Take 162 mg by mouth daily. , Disp: , Rfl:  ?  bisoprolol (ZEBETA) 10 MG tablet, Take 1 tablet by mouth once daily, Disp: 90 tablet, Rfl: 0 ?  cetirizine (ZYRTEC) 10 MG tablet, Take 10 mg by mouth daily as needed for allergies., Disp: , Rfl:  ?  Cholecalciferol (VITAMIN D) 2000 UNITS CAPS, Take 2,000 Units by mouth daily., Disp: , Rfl:  ?  Coenzyme Q10 (COQ10) 200 MG CAPS, Take 1 capsule by mouth daily., Disp: , Rfl:  ?  Cyanocobalamin (VITAMIN B12) 1000 MCG TBCR, Take 1 tablet by mouth daily., Disp: , Rfl:  ?  losartan (COZAAR) 25 MG tablet, Take 12.5 mg by mouth daily. 1/2 tablet daily, Disp: , Rfl:  ?  pantoprazole (PROTONIX) 40 MG tablet, Take 40 mg by mouth daily., Disp: , Rfl:  ?  predniSONE (DELTASONE) 5 MG tablet, Take 5 mg by mouth daily. , Disp: , Rfl:  ?  Probiotic Product (PROBIOTIC DAILY PO), Take 1 capsule by mouth daily., Disp: , Rfl:  ?  Pyridoxine HCl (VITAMIN B6) 250 MG TABS, Take 1 tablet by mouth daily., Disp: , Rfl:  ?  REPATHA SURECLICK 953 MG/ML SOAJ, Inject 1 Syringe into the skin every 14 (fourteen) days., Disp: 6 mL, Rfl: 3 ?  benzonatate (TESSALON) 100 MG capsule, Take 1 capsule (100 mg total) by mouth 3 (three) times daily as needed for cough., Disp: 20 capsule, Rfl: 0  ?   ?Radiology:  ? ?Chest x-ray PA and lateral view 12/24/2020: ?The heart size and mediastinal contours are within normal limits. ?Both lungs are clear. The visualized skeletal structures are ?unremarkable. Aortic ca

## 2022-01-29 DIAGNOSIS — R5383 Other fatigue: Secondary | ICD-10-CM | POA: Diagnosis not present

## 2022-01-29 DIAGNOSIS — M5416 Radiculopathy, lumbar region: Secondary | ICD-10-CM | POA: Diagnosis not present

## 2022-01-29 DIAGNOSIS — Z8739 Personal history of other diseases of the musculoskeletal system and connective tissue: Secondary | ICD-10-CM | POA: Diagnosis not present

## 2022-01-29 DIAGNOSIS — M7060 Trochanteric bursitis, unspecified hip: Secondary | ICD-10-CM | POA: Diagnosis not present

## 2022-01-29 DIAGNOSIS — I2510 Atherosclerotic heart disease of native coronary artery without angina pectoris: Secondary | ICD-10-CM | POA: Diagnosis not present

## 2022-01-29 DIAGNOSIS — M8589 Other specified disorders of bone density and structure, multiple sites: Secondary | ICD-10-CM | POA: Diagnosis not present

## 2022-01-29 DIAGNOSIS — N289 Disorder of kidney and ureter, unspecified: Secondary | ICD-10-CM | POA: Diagnosis not present

## 2022-01-29 DIAGNOSIS — M316 Other giant cell arteritis: Secondary | ICD-10-CM | POA: Diagnosis not present

## 2022-01-29 DIAGNOSIS — M1990 Unspecified osteoarthritis, unspecified site: Secondary | ICD-10-CM | POA: Diagnosis not present

## 2022-01-29 DIAGNOSIS — R42 Dizziness and giddiness: Secondary | ICD-10-CM | POA: Diagnosis not present

## 2022-01-29 DIAGNOSIS — I10 Essential (primary) hypertension: Secondary | ICD-10-CM | POA: Diagnosis not present

## 2022-01-29 DIAGNOSIS — I6529 Occlusion and stenosis of unspecified carotid artery: Secondary | ICD-10-CM | POA: Diagnosis not present

## 2022-01-30 ENCOUNTER — Other Ambulatory Visit: Payer: Self-pay | Admitting: Cardiology

## 2022-01-30 DIAGNOSIS — I10 Essential (primary) hypertension: Secondary | ICD-10-CM

## 2022-01-30 DIAGNOSIS — I2510 Atherosclerotic heart disease of native coronary artery without angina pectoris: Secondary | ICD-10-CM

## 2022-02-03 ENCOUNTER — Telehealth: Payer: Self-pay

## 2022-02-03 DIAGNOSIS — R0989 Other specified symptoms and signs involving the circulatory and respiratory systems: Secondary | ICD-10-CM

## 2022-02-03 DIAGNOSIS — R0609 Other forms of dyspnea: Secondary | ICD-10-CM

## 2022-02-03 DIAGNOSIS — R6889 Other general symptoms and signs: Secondary | ICD-10-CM

## 2022-02-03 NOTE — Telephone Encounter (Signed)
Pt called to inform us that she would like to start pulmonary therapy due that her PCP mention hip therapy. Pt would like to start on the pulmonary therapy bore the hi therapy. Can you put the referral please  ?

## 2022-02-03 NOTE — Telephone Encounter (Signed)
Abnormal lung sounds - Plan: Pulmonary rehab therapeutic exercise ? ?Dyspnea on exertion - Plan: Pulmonary rehab therapeutic exercise ? ?Decreased exercise tolerance - Plan: Pulmonary rehab therapeutic exercise ? ?Orders Placed This Encounter  ?Procedures  ? Pulmonary rehab therapeutic exercise  ?  Standing Status:   Future  ?  Standing Expiration Date:   02/04/2023  ?  ?

## 2022-02-05 DIAGNOSIS — H8110 Benign paroxysmal vertigo, unspecified ear: Secondary | ICD-10-CM | POA: Diagnosis not present

## 2022-02-05 DIAGNOSIS — I10 Essential (primary) hypertension: Secondary | ICD-10-CM | POA: Diagnosis not present

## 2022-02-13 ENCOUNTER — Other Ambulatory Visit (HOSPITAL_COMMUNITY): Payer: Self-pay | Admitting: *Deleted

## 2022-02-13 ENCOUNTER — Other Ambulatory Visit: Payer: Self-pay | Admitting: Cardiology

## 2022-02-13 DIAGNOSIS — R0989 Other specified symptoms and signs involving the circulatory and respiratory systems: Secondary | ICD-10-CM

## 2022-02-13 DIAGNOSIS — R0609 Other forms of dyspnea: Secondary | ICD-10-CM

## 2022-02-13 DIAGNOSIS — R6889 Other general symptoms and signs: Secondary | ICD-10-CM

## 2022-02-26 ENCOUNTER — Encounter (HOSPITAL_COMMUNITY): Payer: Self-pay

## 2022-02-26 ENCOUNTER — Telehealth (HOSPITAL_COMMUNITY): Payer: Self-pay

## 2022-02-26 NOTE — Telephone Encounter (Signed)
Attempted to call patient in regards to Pulmonary Rehab - LM on VM Mailed letter 

## 2022-02-27 ENCOUNTER — Telehealth (HOSPITAL_COMMUNITY): Payer: Self-pay

## 2022-02-27 NOTE — Telephone Encounter (Signed)
  Pt returned PR phone call and stated she is interested in PR.

## 2022-02-27 NOTE — Telephone Encounter (Signed)
Pt insurance is active and benefits verified through Medicare A/B. Co-pay $0.00, DED $226.00/$226.00 met, out of pocket $0.00/$0.00 met, co-insurance 20%. No pre-authorization required. 02/27/22   2ndary insurance is active and benefits verified through [employer]. Co-pay $0.00, DED $0.00/$0.00 met, out of pocket $0.00/$0.00 met, co-insurance 0%. No pre-authorization required. Manuela Schwartz R./[employer], 02/27/22 @ 10:51AM, CWT#[id number]

## 2022-04-01 IMAGING — CR DG SACRUM/COCCYX 2+V
3 series · 3 of 3 positions shown · non-contrast
Comparison: CT lumbar spine 06/11/2017

CLINICAL DATA: Fall with hip and low back pain

EXAM:
SACRUM AND COCCYX - 2+ VIEW

[t sacrum ap]
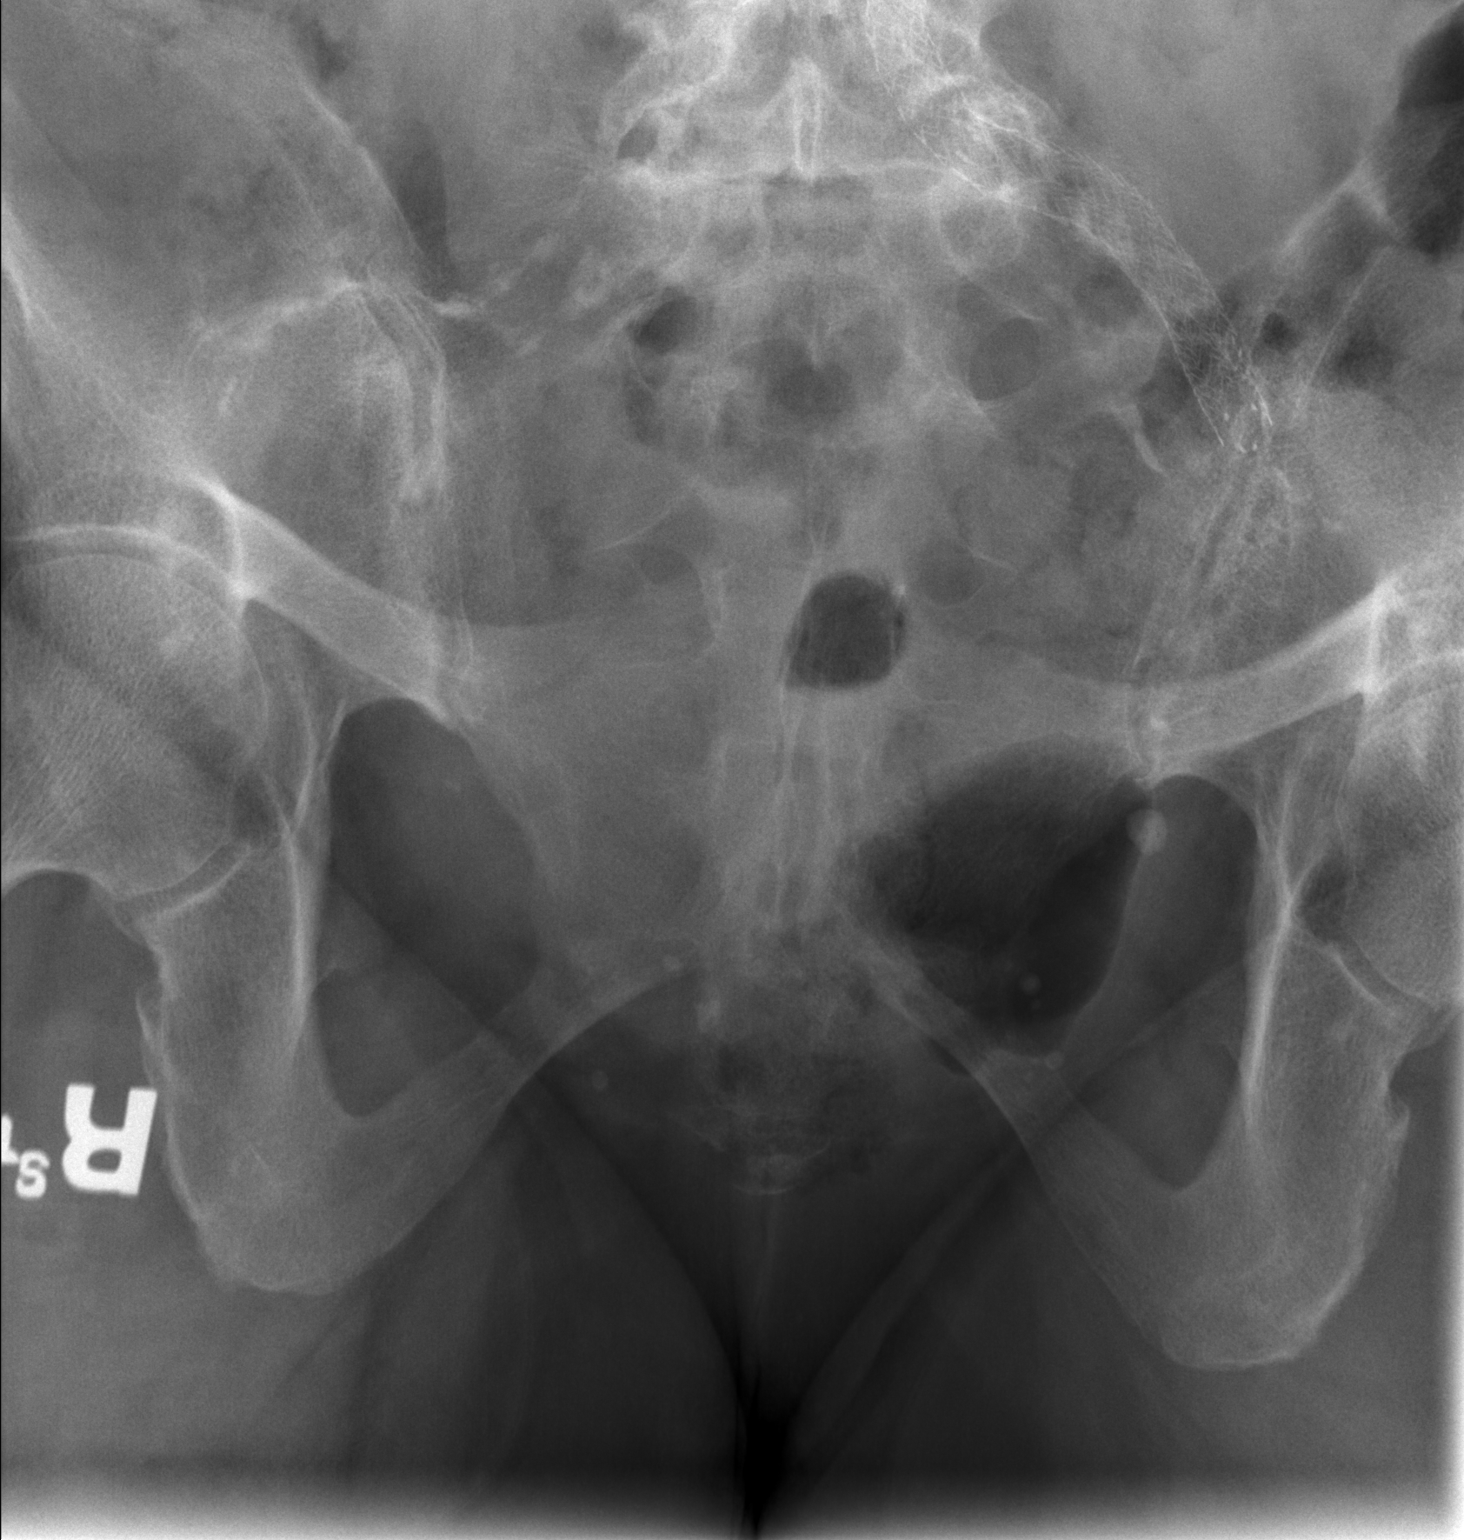

[t coccyx ap]
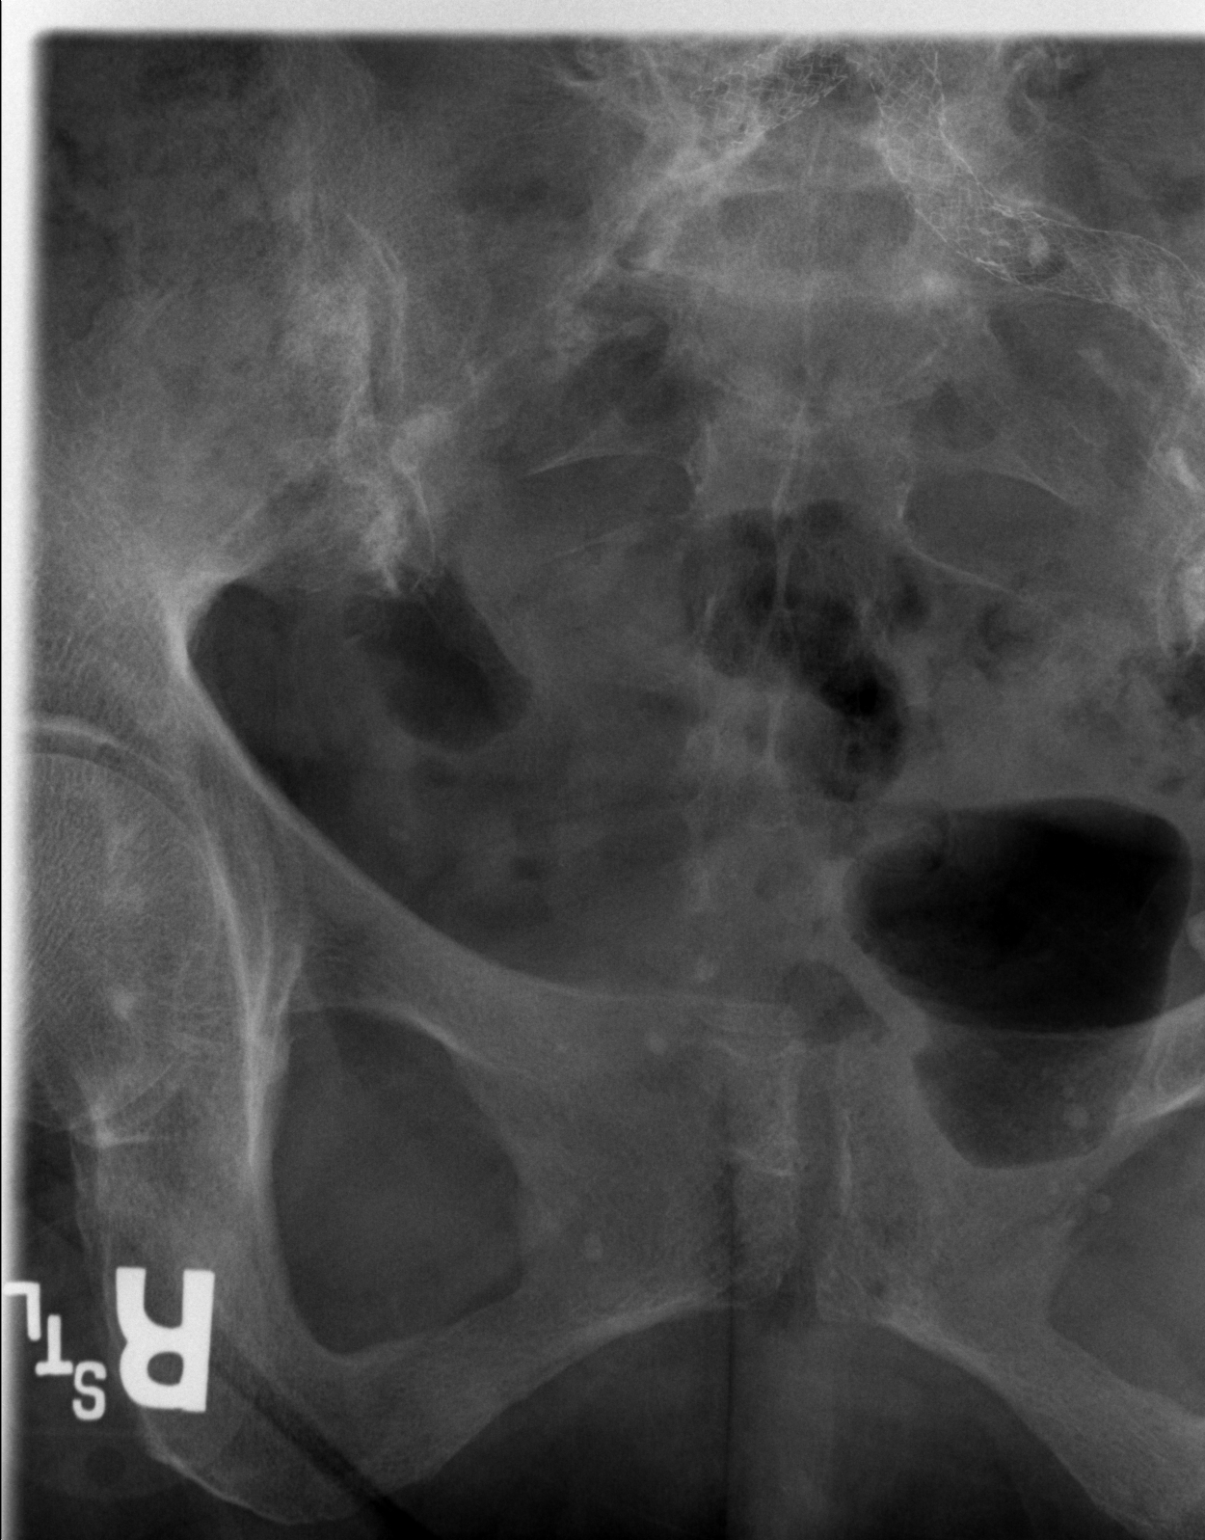

[t sacrum coccyx lat]
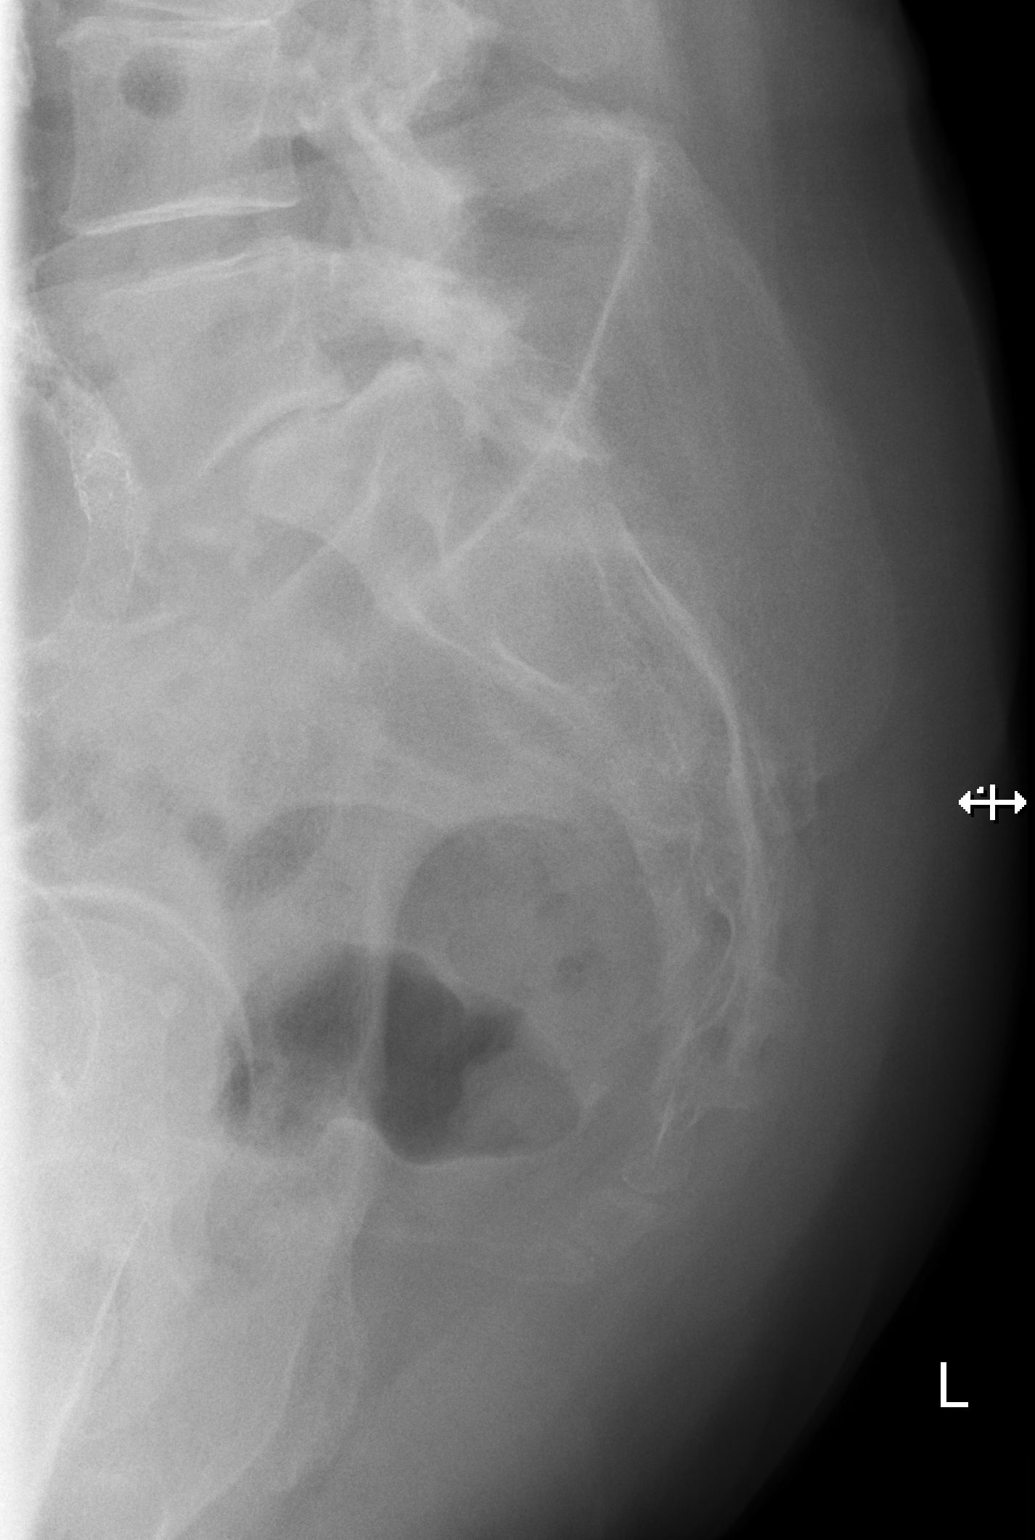

[3 of 3 positions shown; findings below may reference images not displayed]

FINDINGS: Left iliac stent. Pubic symphysis appears intact. 11 mm
anterolisthesis L5 on S1 with degenerative changes. No acute
fracture identified.
IMPRESSION: 1. No acute osseous abnormality.
2. 11 mm anterolisthesis L5 on S1.

## 2022-04-24 ENCOUNTER — Encounter (HOSPITAL_COMMUNITY): Payer: Self-pay

## 2022-05-03 ENCOUNTER — Other Ambulatory Visit: Payer: Self-pay | Admitting: Cardiology

## 2022-05-03 DIAGNOSIS — I2510 Atherosclerotic heart disease of native coronary artery without angina pectoris: Secondary | ICD-10-CM

## 2022-05-03 DIAGNOSIS — I10 Essential (primary) hypertension: Secondary | ICD-10-CM

## 2022-05-06 DIAGNOSIS — M1990 Unspecified osteoarthritis, unspecified site: Secondary | ICD-10-CM | POA: Diagnosis not present

## 2022-05-06 DIAGNOSIS — R42 Dizziness and giddiness: Secondary | ICD-10-CM | POA: Diagnosis not present

## 2022-05-06 DIAGNOSIS — M8589 Other specified disorders of bone density and structure, multiple sites: Secondary | ICD-10-CM | POA: Diagnosis not present

## 2022-05-06 DIAGNOSIS — I2510 Atherosclerotic heart disease of native coronary artery without angina pectoris: Secondary | ICD-10-CM | POA: Diagnosis not present

## 2022-05-06 DIAGNOSIS — I6529 Occlusion and stenosis of unspecified carotid artery: Secondary | ICD-10-CM | POA: Diagnosis not present

## 2022-05-06 DIAGNOSIS — M316 Other giant cell arteritis: Secondary | ICD-10-CM | POA: Diagnosis not present

## 2022-05-06 DIAGNOSIS — Z8739 Personal history of other diseases of the musculoskeletal system and connective tissue: Secondary | ICD-10-CM | POA: Diagnosis not present

## 2022-05-06 DIAGNOSIS — R5383 Other fatigue: Secondary | ICD-10-CM | POA: Diagnosis not present

## 2022-05-06 DIAGNOSIS — I739 Peripheral vascular disease, unspecified: Secondary | ICD-10-CM | POA: Diagnosis not present

## 2022-05-06 DIAGNOSIS — M7060 Trochanteric bursitis, unspecified hip: Secondary | ICD-10-CM | POA: Diagnosis not present

## 2022-05-06 DIAGNOSIS — M5416 Radiculopathy, lumbar region: Secondary | ICD-10-CM | POA: Diagnosis not present

## 2022-05-06 DIAGNOSIS — N289 Disorder of kidney and ureter, unspecified: Secondary | ICD-10-CM | POA: Diagnosis not present

## 2022-05-25 DIAGNOSIS — E785 Hyperlipidemia, unspecified: Secondary | ICD-10-CM | POA: Diagnosis not present

## 2022-05-25 DIAGNOSIS — N1832 Chronic kidney disease, stage 3b: Secondary | ICD-10-CM | POA: Diagnosis not present

## 2022-05-25 DIAGNOSIS — I6529 Occlusion and stenosis of unspecified carotid artery: Secondary | ICD-10-CM | POA: Diagnosis not present

## 2022-05-25 DIAGNOSIS — R7303 Prediabetes: Secondary | ICD-10-CM | POA: Diagnosis not present

## 2022-05-25 DIAGNOSIS — R7989 Other specified abnormal findings of blood chemistry: Secondary | ICD-10-CM | POA: Diagnosis not present

## 2022-05-25 DIAGNOSIS — I10 Essential (primary) hypertension: Secondary | ICD-10-CM | POA: Diagnosis not present

## 2022-05-25 DIAGNOSIS — E559 Vitamin D deficiency, unspecified: Secondary | ICD-10-CM | POA: Diagnosis not present

## 2022-05-27 DIAGNOSIS — I10 Essential (primary) hypertension: Secondary | ICD-10-CM | POA: Diagnosis not present

## 2022-05-27 DIAGNOSIS — R7303 Prediabetes: Secondary | ICD-10-CM | POA: Diagnosis not present

## 2022-05-27 DIAGNOSIS — N1832 Chronic kidney disease, stage 3b: Secondary | ICD-10-CM | POA: Diagnosis not present

## 2022-05-27 DIAGNOSIS — E785 Hyperlipidemia, unspecified: Secondary | ICD-10-CM | POA: Diagnosis not present

## 2022-05-27 DIAGNOSIS — R7989 Other specified abnormal findings of blood chemistry: Secondary | ICD-10-CM | POA: Diagnosis not present

## 2022-05-27 DIAGNOSIS — I6529 Occlusion and stenosis of unspecified carotid artery: Secondary | ICD-10-CM | POA: Diagnosis not present

## 2022-05-27 DIAGNOSIS — E559 Vitamin D deficiency, unspecified: Secondary | ICD-10-CM | POA: Diagnosis not present

## 2022-06-01 DIAGNOSIS — D649 Anemia, unspecified: Secondary | ICD-10-CM | POA: Diagnosis not present

## 2022-06-01 DIAGNOSIS — I6529 Occlusion and stenosis of unspecified carotid artery: Secondary | ICD-10-CM | POA: Diagnosis not present

## 2022-06-01 DIAGNOSIS — Z95828 Presence of other vascular implants and grafts: Secondary | ICD-10-CM | POA: Diagnosis not present

## 2022-06-01 DIAGNOSIS — I129 Hypertensive chronic kidney disease with stage 1 through stage 4 chronic kidney disease, or unspecified chronic kidney disease: Secondary | ICD-10-CM | POA: Diagnosis not present

## 2022-06-01 DIAGNOSIS — N1832 Chronic kidney disease, stage 3b: Secondary | ICD-10-CM | POA: Diagnosis not present

## 2022-06-01 DIAGNOSIS — I2510 Atherosclerotic heart disease of native coronary artery without angina pectoris: Secondary | ICD-10-CM | POA: Diagnosis not present

## 2022-06-01 DIAGNOSIS — E785 Hyperlipidemia, unspecified: Secondary | ICD-10-CM | POA: Diagnosis not present

## 2022-06-01 DIAGNOSIS — Z0000 Encounter for general adult medical examination without abnormal findings: Secondary | ICD-10-CM | POA: Diagnosis not present

## 2022-06-01 DIAGNOSIS — I739 Peripheral vascular disease, unspecified: Secondary | ICD-10-CM | POA: Diagnosis not present

## 2022-06-01 DIAGNOSIS — K219 Gastro-esophageal reflux disease without esophagitis: Secondary | ICD-10-CM | POA: Diagnosis not present

## 2022-06-01 DIAGNOSIS — M5136 Other intervertebral disc degeneration, lumbar region: Secondary | ICD-10-CM | POA: Diagnosis not present

## 2022-06-01 DIAGNOSIS — R7303 Prediabetes: Secondary | ICD-10-CM | POA: Diagnosis not present

## 2022-06-02 ENCOUNTER — Telehealth: Payer: Self-pay

## 2022-06-02 NOTE — Telephone Encounter (Signed)
Patient called and stated that she received a letter requesting a PA for her [place].

## 2022-06-02 NOTE — Telephone Encounter (Signed)
Noted patient needs a PA will work on it and keep patient informed

## 2022-06-23 ENCOUNTER — Encounter (HOSPITAL_COMMUNITY): Payer: Self-pay

## 2022-06-26 ENCOUNTER — Ambulatory Visit: Payer: Medicare Other | Admitting: Cardiology

## 2022-06-26 ENCOUNTER — Encounter: Payer: Self-pay | Admitting: Cardiology

## 2022-06-26 VITALS — BP 107/58 | HR 61 | Temp 97.8°F | Resp 16 | Ht 64.0 in | Wt 140.0 lb

## 2022-06-26 DIAGNOSIS — E7800 Pure hypercholesterolemia, unspecified: Secondary | ICD-10-CM | POA: Diagnosis not present

## 2022-06-26 DIAGNOSIS — R1084 Generalized abdominal pain: Secondary | ICD-10-CM | POA: Diagnosis not present

## 2022-06-26 DIAGNOSIS — I6523 Occlusion and stenosis of bilateral carotid arteries: Secondary | ICD-10-CM | POA: Diagnosis not present

## 2022-06-26 DIAGNOSIS — I739 Peripheral vascular disease, unspecified: Secondary | ICD-10-CM

## 2022-06-26 DIAGNOSIS — I2510 Atherosclerotic heart disease of native coronary artery without angina pectoris: Secondary | ICD-10-CM | POA: Diagnosis not present

## 2022-06-26 DIAGNOSIS — I10 Essential (primary) hypertension: Secondary | ICD-10-CM | POA: Diagnosis not present

## 2022-06-26 DIAGNOSIS — Z87440 Personal history of urinary (tract) infections: Secondary | ICD-10-CM

## 2022-06-26 MED ORDER — REPATHA SURECLICK 140 MG/ML ~~LOC~~ SOAJ: 1.0000 | SUBCUTANEOUS | 3 refills | Status: DC

## 2022-06-26 NOTE — Progress Notes (Unsigned)
Primary Physician/Referring:  Irena Reichmann, DO  Patient ID: [name], female    DOB: 1941-03-31, 81 y.o.   MRN: [id number]  No chief complaint on file.  HPI:    [name]  is a 81 y.o. Caucasian female with coronary artery disease and myocardial infarction in 1999 &  2001, left carotid endarterectomy in 2000, left iliac artery stenting and right SFA stenting and left subclavian artery stenting in the remote past and repeat stenting of bilateral external iliac arteries on  11/12/2015 with balloon expandable stent on the right and self-expanding stent on the left. Repeat peripheral arteriogram on 05/10/2017 which revealed widely patent stents. She is now on Repatha and tolerating this well, started this sometime in September 2020.   Past medical history significant for hypertension, hyperlipidemia, stage IIIa-B chronic kidney disease.  Denies specifically dyspnea or chest pain or palpitations.  Continues to have severe back pain that limits her activities as well and pain shoots down her leg.   Over the past 3 days she has been having abdominal discomfort and thinks she may have a UTI.  No fever or chills.  Past Medical History:  Diagnosis Date   Anemia    Chronic bronchitis (HCC)    Coronary artery disease    Difficulty swallowing pills    Difficulty swallowing solids    GERD (gastroesophageal reflux disease)    H/O temporal arteritis 08/2014-01/2015   HA (headache)    "related to temporal arteritis 08/2014-01/2015"   Hypercholesteremia    Hypertension    Hypoxia    Kidney stones    Left carotid artery stenosis    Myocardial infarct (HCC) 1999; 2001   On home oxygen therapy    "2L at night; don't always use it" (11/12/2015)   Pneumonia 1970s X 1; ~ 2006; ~ 2014   PVD (peripheral vascular disease) (HCC)    Rheumatoid arthritis (HCC)    Syncope and collapse 10/05/2015   Social History   Tobacco Use   Smoking status: Former    Packs/day: 2.00    Years: 42.00    Total  pack years: 84.00    Types: Cigarettes    Quit date: 2001    Years since quitting: 22.7   Smokeless tobacco: Never   Tobacco comments:    Quit in 2000  Substance Use Topics   Alcohol use: No    Alcohol/week: 0.0 standard drinks of alcohol  Marital status: Widowed  ROS  Review of Systems  Cardiovascular:  Positive for claudication (mild, stable) and dyspnea on exertion. Negative for chest pain, leg swelling and orthopnea.   Objective  Blood pressure (!) 107/58, pulse 61, temperature 97.8 F (36.6 C), temperature source Temporal, resp. rate 16, height 5\' 4"  (1.626 m), weight 140 lb (63.5 kg), SpO2 100 %.     06/26/2022    1:15 PM 06/26/2022    1:12 PM 12/31/2021    2:18 PM  Vitals with BMI  Height  5\' 4"    Weight  140 lbs   BMI  24.02   Systolic 107 114 01/02/2022  Diastolic 58 47 84  Pulse 61 55 73     Physical Exam Neck:     Vascular: Carotid bruit (bilateral) present. No JVD.  Cardiovascular:     Rate and Rhythm: Normal rate and regular rhythm.     Pulses: Intact distal pulses.          Femoral pulses are 0 on the right side with bruit and 0 on  the left side with bruit.      Popliteal pulses are 0 on the right side and 1+ on the left side.       Dorsalis pedis pulses are 2+ on the right side and 2+ on the left side.       Posterior tibial pulses are 1+ on the right side and 0 on the left side.     Heart sounds: Normal heart sounds. No murmur heard.    No gallop.  Pulmonary:     Effort: Pulmonary effort is normal.     Breath sounds: Rhonchi (Right base >>left chronic) present.  Abdominal:     General: Bowel sounds are normal.     Palpations: Abdomen is soft.  Musculoskeletal:     Right lower leg: No edema.     Left lower leg: No edema.    Laboratory examination:   External labs:   Labs 05/29/2022:  Hb 10.8/HCT 34.3, platelets 452.  Normal indicis.  Sodium 138, potassium 4.1, BUN 17, creatinine 1.48, EGFR 33.  mL.  Labs 07/31/2021:  BUN 9, creatinine 1.38, EGFR  37 mL, potassium 5.1.  TSH normal at 3.87.  Vitamin D 73.1.  A1c 6.1%.  Total cholesterol 177, triglycerides 143, HDL 75, LDL 73.  Hb 11.9/HCT 37.3, platelets 435.   Medications and allergies   Allergies  Allergen Reactions   Bactrim [Sulfamethoxazole-Trimethoprim] Anaphylaxis   Sulfa Antibiotics Anaphylaxis   Crestor [Rosuvastatin] Cough    "deathly ill"   Ibuprofen Nausea And Vomiting   Ivp Dye [Iodinated Contrast Media] Nausea And Vomiting and Other (See Comments)    "I may have had a rash too" "They normally give me benadryl and steroids before contrast dye"   Lipitor [Atorvastatin] Other (See Comments)    Legs hurt so bad she couldn't walk   Macrodantin Nausea And Vomiting    faint   Naprosyn [Naproxen] Nausea Only and Other (See Comments)    faint   Niaspan [Niacin Er] Other (See Comments)    Flushing, headache   Vioxx [Rofecoxib] Other (See Comments)    "deathly ill"   Zetia [Ezetimibe] Other (See Comments)    Deathly ill    Bacitracin     Other reaction(s): Unknown   Colesevelam Hcl     Other reaction(s): Unknown   Fluvastatin Sodium     Other reaction(s): Unknown   Meloxicam     Can't remember   Tramadol Hcl Nausea Only   Ciprofloxacin Rash   Clarithromycin Rash   Neosporin [Neomycin-Bacitracin Zn-Polymyx] Rash   Phenergan [Promethazine Hcl] Rash   Pletal [Cilostazol] Other (See Comments)    headache   Ramipril Cough     Current Outpatient Medications:    aspirin 81 MG tablet, Take 162 mg by mouth daily. , Disp: , Rfl:    bisoprolol (ZEBETA) 10 MG tablet, Take 1 tablet by mouth once daily, Disp: 90 tablet, Rfl: 0   cetirizine (ZYRTEC) 10 MG tablet, Take 10 mg by mouth daily as needed for allergies., Disp: , Rfl:    Cholecalciferol (VITAMIN D) 2000 UNITS CAPS, Take 2,000 Units by mouth daily., Disp: , Rfl:    Coenzyme Q10 (COQ10) 200 MG CAPS, Take 1 capsule by mouth daily., Disp: , Rfl:    Cyanocobalamin (VITAMIN B12) 1000 MCG TBCR, Take 1 tablet by  mouth daily., Disp: , Rfl:    losartan (COZAAR) 25 MG tablet, Take 12.5 mg by mouth daily. 1/2 tablet daily, Disp: , Rfl:    Omega-3 Fatty Acids (FISH  OIL) 1000 MG CAPS, Take by mouth., Disp: , Rfl:    pantoprazole (PROTONIX) 40 MG tablet, Take 40 mg by mouth daily., Disp: , Rfl:    predniSONE (DELTASONE) 5 MG tablet, Take 5 mg by mouth daily. , Disp: , Rfl:    Probiotic Product (PROBIOTIC DAILY PO), Take 1 capsule by mouth daily., Disp: , Rfl:    Pyridoxine HCl (VITAMIN B6) 250 MG TABS, Take 1 tablet by mouth daily., Disp: , Rfl:    REPATHA SURECLICK 140 MG/ML SOAJ, Inject 1 Syringe into the skin every 14 (fourteen) days., Disp: 19.28 mL, Rfl: 3    Radiology:   Chest x-ray PA and lateral view 12/24/2020: The heart size and mediastinal contours are within normal limits. Both lungs are clear. The visualized skeletal structures are unremarkable. Aortic calcifications are noted. There appears to be a left subclavian artery stent in place. There are advanced degenerative changes of the left glenohumeral joint with remodeling of the left humeral head.   IMPRESSION: No active cardiopulmonary disease.  Cardiac Studies:   Coronary Angiography MI in 1999; Stent in right coronary artery in 1999; MI in 2001; Patent stent at that time  Treadmill stress test  [05/11/2014]: Indications: Diagnosis of Coronary Artery Disease. Conclusions: Negative for ischemia. The patient exercised according to the Bruce protocol, Total time recorded 4 Min. 22 sec. achieving a max heart rate of 128 which was 85% of MPHR for age and 7.3 METS of work. Hypertensive. Baseline NIBP was 130/90. Peak NIBP was 162/102 MaxSysp was: 162 MaxDiasp was: 102. The baseline ECG showed NSR,Non specific ST depression. During exercise there was No additional ST segment depressions in the Inferior and lateral leads. Symptoms: Claudication right leg. Achieved 85% MPHR. Arrhythmia: Occasional PAC. Continue primary prevention. Eval for  claudication.  Abdominal aortic duplex 12/30/2016: Diffuse plaque noted in the proximal, mid and distal aorta. Normal iliac velocity. No AAA observed.  Peripheral arteriogram 04/13/2017 Patent stents placed 11/12/2015- RCIA 9x19 mm Omnilink Elite balloon expandable stent, Left External iliac 74mm CSI atherectomy followed by 9x60 mm self expanding Absolute Pro stent. Right LE arteriogram: No significant disease.  Lower Extremity Arterial Duplex 12/23/2021: No hemodynamically significant stenosis are identified in the bilateral lower extremity arterial system. This exam reveals mildly decreased perfusion of the right and left lower extremity with right ABI 1.32 and left ABI 1.33 with mildly abnormal biphasic waveform at the ankles. Study suggests patent right SFA and left iliac stent.  There is diffuse calcific plaque throughout the bilateral lower extremity arterial tree.  No significant change compared to 03/26/2016.Lower Extremity Arterial Duplex 12/23/2021: No hemodynamically significant stenosis are identified in the bilateral lower extremity arterial system. This exam reveals mildly decreased perfusion of the right and left lower extremity with right ABI 1.32 and left ABI 1.33 with mildly abnormal biphasic waveform at the ankles. Study suggests patent right SFA and left iliac stent.  There is diffuse calcific plaque throughout the bilateral lower extremity arterial tree.  No significant change compared to 03/26/2016.  Carotid artery duplex 12/23/2021: Duplex suggests stenosis in the right internal carotid artery (16-49%). Duplex suggests stenosis in the left carotid artery bulb of >50%. Antegrade right vertebral artery flow. Antegrade left vertebral artery flow. No significant change since 06/17/2021. Follow up in one year is appropriate if clinically indicated.  EKG  EKG 06/26/2022: Normal sinus rhythm at rate of 71 bpm, left atrial enlargement, normal axis.  PVCs (2).  Compared to 12/23/2020,  PVCs are new.  Assessment  ICD-10-CM   1. PAD (peripheral artery disease) (HCC)  I73.9 EKG 12-Lead    EKG 12-Lead    REPATHA SURECLICK 295 MG/ML SOAJ    2. Coronary artery disease involving native coronary artery of native heart without angina pectoris  J88.41 REPATHA SURECLICK 660 MG/ML SOAJ    3. Hypercholesteremia  E78.00 Lipid Panel With LDL/HDL Ratio    REPATHA SURECLICK 630 MG/ML SOAJ    4. Primary hypertension  Z60 Basic metabolic panel    5. Generalized abdominal pain  R10.84 UA/M w/rflx Culture, Routine    6. H/O recurrent urinary tract infection  Z87.440 UA/M w/rflx Culture, Routine       Meds ordered this encounter  Medications   REPATHA SURECLICK 109 MG/ML SOAJ    Sig: Inject 1 Syringe into the skin every 14 (fourteen) days.    Dispense:  19.28 mL    Refill:  3    6 prefilled syringes    Medications Discontinued During This Encounter  Medication Reason   benzonatate (TESSALON) 100 MG capsule    REPATHA SURECLICK 323 MG/ML SOAJ Reorder     Recommendations:   SIARAH DELEO  is a  81 y.o. Caucasian female with coronary artery disease and myocardial infarction in 1999 &  2001, left carotid endarterectomy in 2000, left iliac artery stenting and right SFA stenting and left subclavian artery stenting in the remote past and repeat stenting of bilateral external iliac arteries on  11/12/2015 with balloon expandable stent on the right and self-expanding stent on the left. Repeat peripheral arteriogram on 05/10/2017 which revealed widely patent stents. She is now on Repatha and tolerating this well, started this sometime in September 2020.   Past medical history significant for hypertension, hyperlipidemia, stage IIIa-B chronic kidney disease.  No change in physical exam or EKG,  she has not had any anginal symptoms.  No change in symptoms of claudication.  She also has neurogenic claudication and has been recommended surgery but she is hesitant to go through with this.   States that overall she is doing well and would like to continue medical therapy.  Blood pressure is well controlled, no changes in the medications were done today.  Over the past 3 to 4 days, she has had abdominal discomfort.  She thinks that she may have a UTI as she has had frequent UTIs in the past and she presents atypically.  I have ordered for UA and C&S.  I will certainly forward that to her PCP.  Once resulted.  Carotid artery stenosis has remained stable.  Continue routine surveillance.  She is presently on Repatha for hyperlipidemia, LDL is slightly high at 79 however she has not been able to tolerate any statins or even Zetia.  I would like to recheck lipids today.  I will see him back in 6 months.   Adrian Prows, PA-C 06/26/2022, 5:07 PM Office: [phone]

## 2022-06-29 DIAGNOSIS — E7800 Pure hypercholesterolemia, unspecified: Secondary | ICD-10-CM | POA: Diagnosis not present

## 2022-06-29 DIAGNOSIS — I10 Essential (primary) hypertension: Secondary | ICD-10-CM | POA: Diagnosis not present

## 2022-06-30 LAB — BASIC METABOLIC PANEL
BUN/Creatinine Ratio: 12 (ref 12–28)
BUN: 16 mg/dL (ref 8–27)
CO2: 22 mmol/L (ref 20–29)
Calcium: 9.5 mg/dL (ref 8.7–10.3)
Chloride: 100 mmol/L (ref 96–106)
Creatinine, Ser: 1.32 mg/dL — ABNORMAL HIGH (ref 0.57–1.00)
Glucose: 83 mg/dL (ref 70–99)
Potassium: 4.8 mmol/L (ref 3.5–5.2)
Sodium: 137 mmol/L (ref 134–144)
eGFR: 41 mL/min/{1.73_m2} — ABNORMAL LOW (ref >59)

## 2022-06-30 LAB — LIPID PANEL WITH LDL/HDL RATIO
Cholesterol, Total: 172 mg/dL (ref 100–199)
HDL: 72 mg/dL (ref >39)
LDL Chol Calc (NIH): 76 mg/dL (ref 0–99)
LDL/HDL Ratio: 1.1 ratio (ref 0.0–3.2)
Triglycerides: 144 mg/dL (ref 0–149)
VLDL Cholesterol Cal: 24 mg/dL (ref 5–40)

## 2022-07-01 LAB — MICROSCOPIC EXAMINATION
Bacteria, UA: NONE SEEN
Casts: NONE SEEN /lpf
RBC, Urine: NONE SEEN /hpf (ref 0–2)

## 2022-07-01 LAB — UA/M W/RFLX CULTURE, ROUTINE
Bilirubin, UA: NEGATIVE
Glucose, UA: NEGATIVE
Ketones, UA: NEGATIVE
Nitrite, UA: NEGATIVE
Protein,UA: NEGATIVE
RBC, UA: NEGATIVE
Specific Gravity, UA: 1.014 (ref 1.005–1.030)
Urobilinogen, Ur: 1 mg/dL (ref 0.2–1.0)
pH, UA: 6 (ref 5.0–7.5)

## 2022-07-01 LAB — URINE CULTURE, REFLEX

## 2022-07-02 DIAGNOSIS — R7989 Other specified abnormal findings of blood chemistry: Secondary | ICD-10-CM | POA: Diagnosis not present

## 2022-07-02 DIAGNOSIS — I2510 Atherosclerotic heart disease of native coronary artery without angina pectoris: Secondary | ICD-10-CM | POA: Diagnosis not present

## 2022-07-02 DIAGNOSIS — N1832 Chronic kidney disease, stage 3b: Secondary | ICD-10-CM | POA: Diagnosis not present

## 2022-07-02 DIAGNOSIS — D649 Anemia, unspecified: Secondary | ICD-10-CM | POA: Diagnosis not present

## 2022-07-02 DIAGNOSIS — E785 Hyperlipidemia, unspecified: Secondary | ICD-10-CM | POA: Diagnosis not present

## 2022-07-02 DIAGNOSIS — R7303 Prediabetes: Secondary | ICD-10-CM | POA: Diagnosis not present

## 2022-07-02 DIAGNOSIS — E559 Vitamin D deficiency, unspecified: Secondary | ICD-10-CM | POA: Diagnosis not present

## 2022-07-02 DIAGNOSIS — I6529 Occlusion and stenosis of unspecified carotid artery: Secondary | ICD-10-CM | POA: Diagnosis not present

## 2022-07-13 ENCOUNTER — Other Ambulatory Visit: Payer: Self-pay | Admitting: Family Medicine

## 2022-07-13 DIAGNOSIS — Z1231 Encounter for screening mammogram for malignant neoplasm of breast: Secondary | ICD-10-CM

## 2022-07-15 ENCOUNTER — Telehealth (HOSPITAL_COMMUNITY): Payer: Self-pay

## 2022-07-15 NOTE — Telephone Encounter (Signed)
No response from pt regarding CR.  Closed referral.  

## 2022-07-16 DIAGNOSIS — M5032 Other cervical disc degeneration, mid-cervical region, unspecified level: Secondary | ICD-10-CM | POA: Diagnosis not present

## 2022-07-16 DIAGNOSIS — M9901 Segmental and somatic dysfunction of cervical region: Secondary | ICD-10-CM | POA: Diagnosis not present

## 2022-07-16 DIAGNOSIS — M9902 Segmental and somatic dysfunction of thoracic region: Secondary | ICD-10-CM | POA: Diagnosis not present

## 2022-07-16 DIAGNOSIS — S29012A Strain of muscle and tendon of back wall of thorax, initial encounter: Secondary | ICD-10-CM | POA: Diagnosis not present

## 2022-07-21 DIAGNOSIS — M5032 Other cervical disc degeneration, mid-cervical region, unspecified level: Secondary | ICD-10-CM | POA: Diagnosis not present

## 2022-07-21 DIAGNOSIS — M9901 Segmental and somatic dysfunction of cervical region: Secondary | ICD-10-CM | POA: Diagnosis not present

## 2022-07-21 DIAGNOSIS — M9902 Segmental and somatic dysfunction of thoracic region: Secondary | ICD-10-CM | POA: Diagnosis not present

## 2022-07-21 DIAGNOSIS — S29012A Strain of muscle and tendon of back wall of thorax, initial encounter: Secondary | ICD-10-CM | POA: Diagnosis not present

## 2022-07-24 DIAGNOSIS — M5032 Other cervical disc degeneration, mid-cervical region, unspecified level: Secondary | ICD-10-CM | POA: Diagnosis not present

## 2022-07-24 DIAGNOSIS — S29012A Strain of muscle and tendon of back wall of thorax, initial encounter: Secondary | ICD-10-CM | POA: Diagnosis not present

## 2022-07-24 DIAGNOSIS — M9902 Segmental and somatic dysfunction of thoracic region: Secondary | ICD-10-CM | POA: Diagnosis not present

## 2022-07-24 DIAGNOSIS — M9901 Segmental and somatic dysfunction of cervical region: Secondary | ICD-10-CM | POA: Diagnosis not present

## 2022-07-24 DIAGNOSIS — D649 Anemia, unspecified: Secondary | ICD-10-CM | POA: Diagnosis not present

## 2022-07-25 ENCOUNTER — Other Ambulatory Visit: Payer: Self-pay | Admitting: Cardiology

## 2022-07-25 DIAGNOSIS — I10 Essential (primary) hypertension: Secondary | ICD-10-CM

## 2022-07-25 DIAGNOSIS — I2510 Atherosclerotic heart disease of native coronary artery without angina pectoris: Secondary | ICD-10-CM

## 2022-07-28 DIAGNOSIS — M9902 Segmental and somatic dysfunction of thoracic region: Secondary | ICD-10-CM | POA: Diagnosis not present

## 2022-07-28 DIAGNOSIS — M9901 Segmental and somatic dysfunction of cervical region: Secondary | ICD-10-CM | POA: Diagnosis not present

## 2022-07-28 DIAGNOSIS — M5032 Other cervical disc degeneration, mid-cervical region, unspecified level: Secondary | ICD-10-CM | POA: Diagnosis not present

## 2022-07-28 DIAGNOSIS — S29012A Strain of muscle and tendon of back wall of thorax, initial encounter: Secondary | ICD-10-CM | POA: Diagnosis not present

## 2022-07-31 DIAGNOSIS — M5032 Other cervical disc degeneration, mid-cervical region, unspecified level: Secondary | ICD-10-CM | POA: Diagnosis not present

## 2022-07-31 DIAGNOSIS — S29012A Strain of muscle and tendon of back wall of thorax, initial encounter: Secondary | ICD-10-CM | POA: Diagnosis not present

## 2022-07-31 DIAGNOSIS — M9901 Segmental and somatic dysfunction of cervical region: Secondary | ICD-10-CM | POA: Diagnosis not present

## 2022-07-31 DIAGNOSIS — M9902 Segmental and somatic dysfunction of thoracic region: Secondary | ICD-10-CM | POA: Diagnosis not present

## 2022-07-31 DIAGNOSIS — Z23 Encounter for immunization: Secondary | ICD-10-CM | POA: Diagnosis not present

## 2022-08-04 ENCOUNTER — Other Ambulatory Visit: Payer: Self-pay | Admitting: Family Medicine

## 2022-08-04 ENCOUNTER — Other Ambulatory Visit: Payer: Self-pay

## 2022-08-04 ENCOUNTER — Other Ambulatory Visit: Payer: Self-pay | Admitting: Cardiology

## 2022-08-04 DIAGNOSIS — I739 Peripheral vascular disease, unspecified: Secondary | ICD-10-CM

## 2022-08-04 DIAGNOSIS — Z006 Encounter for examination for normal comparison and control in clinical research program: Secondary | ICD-10-CM

## 2022-08-04 DIAGNOSIS — I2510 Atherosclerotic heart disease of native coronary artery without angina pectoris: Secondary | ICD-10-CM

## 2022-08-05 DIAGNOSIS — S29012A Strain of muscle and tendon of back wall of thorax, initial encounter: Secondary | ICD-10-CM | POA: Diagnosis not present

## 2022-08-05 DIAGNOSIS — M9902 Segmental and somatic dysfunction of thoracic region: Secondary | ICD-10-CM | POA: Diagnosis not present

## 2022-08-05 DIAGNOSIS — M9901 Segmental and somatic dysfunction of cervical region: Secondary | ICD-10-CM | POA: Diagnosis not present

## 2022-08-05 DIAGNOSIS — M5032 Other cervical disc degeneration, mid-cervical region, unspecified level: Secondary | ICD-10-CM | POA: Diagnosis not present

## 2022-08-07 DIAGNOSIS — M5032 Other cervical disc degeneration, mid-cervical region, unspecified level: Secondary | ICD-10-CM | POA: Diagnosis not present

## 2022-08-07 DIAGNOSIS — M9901 Segmental and somatic dysfunction of cervical region: Secondary | ICD-10-CM | POA: Diagnosis not present

## 2022-08-07 DIAGNOSIS — S29012A Strain of muscle and tendon of back wall of thorax, initial encounter: Secondary | ICD-10-CM | POA: Diagnosis not present

## 2022-08-07 DIAGNOSIS — M9902 Segmental and somatic dysfunction of thoracic region: Secondary | ICD-10-CM | POA: Diagnosis not present

## 2022-08-10 ENCOUNTER — Ambulatory Visit: Payer: Medicare Other

## 2022-08-10 DIAGNOSIS — Z006 Encounter for examination for normal comparison and control in clinical research program: Secondary | ICD-10-CM

## 2022-08-11 NOTE — Progress Notes (Signed)
ICD-10-CM   1. Exam for clinical research  Z00.6 PCV ECHOCARDIOGRAM COMPLETE   Novo [employer]isk [phone]    2. PAD (peripheral artery disease) (HCC)  I73.9 PCV ECHOCARDIOGRAM COMPLETE    3. Coronary artery disease involving native coronary artery of native heart without angina pectoris  I25.10 PCV ECHOCARDIOGRAM COMPLETE      Orders Placed This Encounter  Procedures   PCV ECHOCARDIOGRAM COMPLETE    "Prevail-ASCVD" study using Cholestryl Esther transfer protein inhibitor Obecertrapib 10 mg daily in patients with ASCVD on maximal lipid-lowering therapy, LDL >80 mg and CV risk reduction.    Standing Status:   Future    Standing Expiration Date:   08/05/2023    Order Specific Question:   Who will be the designated reader for this study?    Answer:   Adrian Prows    No orders of the defined types were placed in this encounter.

## 2022-08-13 DIAGNOSIS — M9901 Segmental and somatic dysfunction of cervical region: Secondary | ICD-10-CM | POA: Diagnosis not present

## 2022-08-13 DIAGNOSIS — S29012A Strain of muscle and tendon of back wall of thorax, initial encounter: Secondary | ICD-10-CM | POA: Diagnosis not present

## 2022-08-13 DIAGNOSIS — M9902 Segmental and somatic dysfunction of thoracic region: Secondary | ICD-10-CM | POA: Diagnosis not present

## 2022-08-13 DIAGNOSIS — M5032 Other cervical disc degeneration, mid-cervical region, unspecified level: Secondary | ICD-10-CM | POA: Diagnosis not present

## 2022-08-17 ENCOUNTER — Ambulatory Visit: Payer: Medicare Other

## 2022-08-20 DIAGNOSIS — M9902 Segmental and somatic dysfunction of thoracic region: Secondary | ICD-10-CM | POA: Diagnosis not present

## 2022-08-20 DIAGNOSIS — M5032 Other cervical disc degeneration, mid-cervical region, unspecified level: Secondary | ICD-10-CM | POA: Diagnosis not present

## 2022-08-20 DIAGNOSIS — M9901 Segmental and somatic dysfunction of cervical region: Secondary | ICD-10-CM | POA: Diagnosis not present

## 2022-08-20 DIAGNOSIS — S29012A Strain of muscle and tendon of back wall of thorax, initial encounter: Secondary | ICD-10-CM | POA: Diagnosis not present

## 2022-08-27 DIAGNOSIS — M9902 Segmental and somatic dysfunction of thoracic region: Secondary | ICD-10-CM | POA: Diagnosis not present

## 2022-08-27 DIAGNOSIS — M5032 Other cervical disc degeneration, mid-cervical region, unspecified level: Secondary | ICD-10-CM | POA: Diagnosis not present

## 2022-08-27 DIAGNOSIS — M9901 Segmental and somatic dysfunction of cervical region: Secondary | ICD-10-CM | POA: Diagnosis not present

## 2022-08-27 DIAGNOSIS — S29012A Strain of muscle and tendon of back wall of thorax, initial encounter: Secondary | ICD-10-CM | POA: Diagnosis not present

## 2022-09-01 DIAGNOSIS — S29012A Strain of muscle and tendon of back wall of thorax, initial encounter: Secondary | ICD-10-CM | POA: Diagnosis not present

## 2022-09-01 DIAGNOSIS — M5032 Other cervical disc degeneration, mid-cervical region, unspecified level: Secondary | ICD-10-CM | POA: Diagnosis not present

## 2022-09-01 DIAGNOSIS — M9902 Segmental and somatic dysfunction of thoracic region: Secondary | ICD-10-CM | POA: Diagnosis not present

## 2022-09-01 DIAGNOSIS — M9901 Segmental and somatic dysfunction of cervical region: Secondary | ICD-10-CM | POA: Diagnosis not present

## 2022-09-08 ENCOUNTER — Ambulatory Visit
Admission: RE | Admit: 2022-09-08 | Discharge: 2022-09-08 | Disposition: A | Discharge status: Home / Self Care | Payer: Medicare Other | Source: Ambulatory Visit | Attending: Family Medicine | Admitting: Family Medicine

## 2022-09-08 DIAGNOSIS — Z1231 Encounter for screening mammogram for malignant neoplasm of breast: Secondary | ICD-10-CM | POA: Diagnosis not present

## 2022-09-10 DIAGNOSIS — M9902 Segmental and somatic dysfunction of thoracic region: Secondary | ICD-10-CM | POA: Diagnosis not present

## 2022-09-10 DIAGNOSIS — M5032 Other cervical disc degeneration, mid-cervical region, unspecified level: Secondary | ICD-10-CM | POA: Diagnosis not present

## 2022-09-10 DIAGNOSIS — M9901 Segmental and somatic dysfunction of cervical region: Secondary | ICD-10-CM | POA: Diagnosis not present

## 2022-09-10 DIAGNOSIS — S29012A Strain of muscle and tendon of back wall of thorax, initial encounter: Secondary | ICD-10-CM | POA: Diagnosis not present

## 2022-10-07 ENCOUNTER — Emergency Department (HOSPITAL_COMMUNITY)
Admission: EM | Admit: 2022-10-07 | Discharge: 2022-10-07 | Disposition: A | Discharge status: Home / Self Care | Payer: Medicare Other | Attending: Emergency Medicine | Admitting: Emergency Medicine

## 2022-10-07 ENCOUNTER — Emergency Department (HOSPITAL_COMMUNITY): Payer: Medicare Other

## 2022-10-07 ENCOUNTER — Other Ambulatory Visit: Payer: Self-pay

## 2022-10-07 ENCOUNTER — Encounter (HOSPITAL_COMMUNITY): Payer: Self-pay

## 2022-10-07 DIAGNOSIS — Z8679 Personal history of other diseases of the circulatory system: Secondary | ICD-10-CM | POA: Diagnosis not present

## 2022-10-07 DIAGNOSIS — Z7982 Long term (current) use of aspirin: Secondary | ICD-10-CM | POA: Diagnosis not present

## 2022-10-07 DIAGNOSIS — I10 Essential (primary) hypertension: Secondary | ICD-10-CM | POA: Insufficient documentation

## 2022-10-07 DIAGNOSIS — R55 Syncope and collapse: Secondary | ICD-10-CM | POA: Diagnosis not present

## 2022-10-07 DIAGNOSIS — Z79899 Other long term (current) drug therapy: Secondary | ICD-10-CM | POA: Diagnosis not present

## 2022-10-07 DIAGNOSIS — R1111 Vomiting without nausea: Secondary | ICD-10-CM | POA: Diagnosis not present

## 2022-10-07 DIAGNOSIS — W01198A Fall on same level from slipping, tripping and stumbling with subsequent striking against other object, initial encounter: Secondary | ICD-10-CM | POA: Diagnosis not present

## 2022-10-07 DIAGNOSIS — R059 Cough, unspecified: Secondary | ICD-10-CM | POA: Diagnosis not present

## 2022-10-07 DIAGNOSIS — I951 Orthostatic hypotension: Secondary | ICD-10-CM

## 2022-10-07 DIAGNOSIS — U071 COVID-19: Secondary | ICD-10-CM | POA: Insufficient documentation

## 2022-10-07 DIAGNOSIS — R231 Pallor: Secondary | ICD-10-CM | POA: Diagnosis not present

## 2022-10-07 DIAGNOSIS — W19XXXA Unspecified fall, initial encounter: Secondary | ICD-10-CM | POA: Diagnosis not present

## 2022-10-07 DIAGNOSIS — S0003XA Contusion of scalp, initial encounter: Secondary | ICD-10-CM | POA: Diagnosis not present

## 2022-10-07 DIAGNOSIS — I491 Atrial premature depolarization: Secondary | ICD-10-CM | POA: Diagnosis not present

## 2022-10-07 DIAGNOSIS — S0990XA Unspecified injury of head, initial encounter: Secondary | ICD-10-CM | POA: Diagnosis not present

## 2022-10-07 LAB — CBC WITH DIFFERENTIAL/PLATELET
Abs Immature Granulocytes: 0.01 10*3/uL (ref 0.00–0.07)
Basophils Absolute: 0 10*3/uL (ref 0.0–0.1)
Basophils Relative: 0 %
Eosinophils Absolute: 0 10*3/uL (ref 0.0–0.5)
Eosinophils Relative: 0 %
HCT: 34 % — ABNORMAL LOW (ref 36.0–46.0)
Hemoglobin: 10.4 g/dL — ABNORMAL LOW (ref 12.0–15.0)
Immature Granulocytes: 0 %
Lymphocytes Relative: 10 %
Lymphs Abs: 0.5 10*3/uL — ABNORMAL LOW (ref 0.7–4.0)
MCH: 24.6 pg — ABNORMAL LOW (ref 26.0–34.0)
MCHC: 30.6 g/dL (ref 30.0–36.0)
MCV: 80.6 fL (ref 80.0–100.0)
Monocytes Absolute: 0.6 10*3/uL (ref 0.1–1.0)
Monocytes Relative: 11 %
Neutro Abs: 4.1 10*3/uL (ref 1.7–7.7)
Neutrophils Relative %: 79 %
Platelets: 326 10*3/uL (ref 150–400)
RBC: 4.22 MIL/uL (ref 3.87–5.11)
RDW: 15.2 % (ref 11.5–15.5)
WBC: 5.2 10*3/uL (ref 4.0–10.5)
nRBC: 0 % (ref 0.0–0.2)

## 2022-10-07 LAB — COMPREHENSIVE METABOLIC PANEL
ALT: 16 U/L (ref 0–44)
AST: 30 U/L (ref 15–41)
Albumin: 3.4 g/dL — ABNORMAL LOW (ref 3.5–5.0)
Alkaline Phosphatase: 51 U/L (ref 38–126)
Anion gap: 10 (ref 5–15)
BUN: 19 mg/dL (ref 8–23)
CO2: 23 mmol/L (ref 22–32)
Calcium: 8.7 mg/dL — ABNORMAL LOW (ref 8.9–10.3)
Chloride: 98 mmol/L (ref 98–111)
Creatinine, Ser: 1.3 mg/dL — ABNORMAL HIGH (ref 0.44–1.00)
GFR, Estimated: 42 mL/min — ABNORMAL LOW (ref >60)
Glucose, Bld: 86 mg/dL (ref 70–99)
Potassium: 3.9 mmol/L (ref 3.5–5.1)
Sodium: 131 mmol/L — ABNORMAL LOW (ref 135–145)
Total Bilirubin: 1.1 mg/dL (ref 0.3–1.2)
Total Protein: 6.7 g/dL (ref 6.5–8.1)

## 2022-10-07 LAB — RESP PANEL BY RT-PCR (RSV, FLU A&B, COVID)  RVPGX2
Influenza A by PCR: NEGATIVE
Influenza B by PCR: NEGATIVE
Resp Syncytial Virus by PCR: NEGATIVE
SARS Coronavirus 2 by RT PCR: POSITIVE — AB

## 2022-10-07 NOTE — ED Triage Notes (Addendum)
Per EMS- patient reports that she had vomiting yesterday. Patient was at [facility] this afternoon and states she turned to fast and tripped. Patient reports that she hit her head on a metal door. Patient has a hematoma to the right side of her head.  EMS reports that when they arrived the patient's intial systolic was 60 and the patient was clammy/ Patient received LR 900 ml prior to arrival to the ED and BP is now 126/70. EMS also gave Zofran 4 mg IV prior to arrival to the ED.

## 2022-10-07 NOTE — ED Provider Notes (Signed)
[facility] DEPT Provider Note   CSN: [id number] Arrival date & time: 10/07/22  1512     History  Chief Complaint  Patient presents with   Fall   Emesis   Head Injury    [name] is a 81 y.o. female.  Patient is a 81 year old female with a history of CAD status post 7 stents, PVD, hypertension, hyperlipidemia, RA, syncope, temporal arteritis on home oxygen to use at night as needed who is [facility] after having a syncopal event.  Patient reports for the last 2 days since Christmas she has had a bad cough, several episodes of emesis, poor oral intake.  Patient reports she called her doctor because she was feeling unwell to get an appointment and they said they would not see her until she had a COVID test.  [facility].  She was standing in line when she suddenly felt very hot and knew she needed to go sit down.  However she did not make it to the chair and she passed out hitting her head on the corner of a metal door.  When EMS arrived patient was pale and clammy with a blood pressure of 60 systolic.  She received a liter of fluid and route and upon arrival here her blood pressure had improved to 301 systolic.  Patient reports she did take all of her medications this morning including all of her blood pressure medications.  She takes aspirin but no other anticoagulation.  She is still having a headache but had that before her syncopal event and fall.  She did receive a flu shot this season and did not get the last COVID shot but got the 1 before that.  The history is provided by the patient and the EMS personnel.  Fall  Emesis Head Injury Associated symptoms: vomiting        Home Medications Prior to Admission medications   Medication Sig Start Date End Date Taking? Authorizing Provider  aspirin 81 MG tablet Take 162 mg by mouth daily.     [provider]  bisoprolol (ZEBETA) 10 MG tablet Take 1  tablet by mouth once daily 07/27/22   Adrian Prows, MD  cetirizine (ZYRTEC) 10 MG tablet Take 10 mg by mouth daily as needed for allergies.    [provider]  Cholecalciferol (VITAMIN D) 2000 UNITS CAPS Take 2,000 Units by mouth daily.    [provider]  Coenzyme Q10 (COQ10) 200 MG CAPS Take 1 capsule by mouth daily.    [provider]  Cyanocobalamin (VITAMIN B12) 1000 MCG TBCR Take 1 tablet by mouth daily.    [provider]  losartan (COZAAR) 25 MG tablet Take 12.5 mg by mouth daily. 1/2 tablet daily    [provider]  Omega-3 Fatty Acids (FISH OIL) 1000 MG CAPS Take by mouth.    [provider]  pantoprazole (PROTONIX) 40 MG tablet Take 40 mg by mouth daily.    [provider]  predniSONE (DELTASONE) 5 MG tablet Take 5 mg by mouth daily.     [provider]  Probiotic Product (PROBIOTIC DAILY PO) Take 1 capsule by mouth daily.    [provider]  Pyridoxine HCl (VITAMIN B6) 250 MG TABS Take 1 tablet by mouth daily.    [provider]  REPATHA SURECLICK 601 MG/ML SOAJ Inject 1 Syringe into the skin every 14 (fourteen) days. 06/26/22   Adrian Prows,  MD      Allergies    Bactrim [sulfamethoxazole-trimethoprim], Sulfa antibiotics, Crestor [rosuvastatin], Ibuprofen, Ivp dye [iodinated contrast media], Lipitor [atorvastatin], Macrodantin, Naprosyn [naproxen], Niaspan [niacin er], Vioxx [rofecoxib], Zetia [ezetimibe], Bacitracin, Colesevelam hcl, Fluvastatin sodium, Meloxicam, Tramadol hcl, Ciprofloxacin, Clarithromycin, Neosporin [neomycin-bacitracin zn-polymyx], Phenergan [promethazine hcl], Pletal [cilostazol], and Ramipril    Review of Systems   Review of Systems  Gastrointestinal:  Positive for vomiting.    Physical Exam Updated Vital Signs BP 126/78   Pulse 83   Temp 97.6 F (36.4 C) (Oral)   Resp 14   Ht '5\' 4"'$  (1.626 m)   Wt 59 kg   SpO2 95%   BMI 22.31 kg/m  Physical Exam Vitals and nursing  note reviewed.  Constitutional:      General: She is not in acute distress.    Appearance: She is well-developed.  HENT:     Head: Normocephalic and atraumatic.     Mouth/Throat:     Mouth: Mucous membranes are dry.  Eyes:     Pupils: Pupils are equal, round, and reactive to light.  Cardiovascular:     Rate and Rhythm: Normal rate and regular rhythm.     Heart sounds: Normal heart sounds. No murmur heard.    No friction rub.  Pulmonary:     Effort: Pulmonary effort is normal.     Breath sounds: Normal breath sounds. No wheezing or rales.  Abdominal:     General: Bowel sounds are normal. There is no distension.     Palpations: Abdomen is soft.     Tenderness: There is no abdominal tenderness. There is no guarding or rebound.  Musculoskeletal:        General: No tenderness. Normal range of motion.     Right lower leg: No edema.     Left lower leg: No edema.     Comments: No edema  Skin:    General: Skin is warm and dry.     Findings: No rash.  Neurological:     Mental Status: She is alert and oriented to person, place, and time.     Cranial Nerves: No cranial nerve deficit.  Psychiatric:        Behavior: Behavior normal.     ED Results / Procedures / Treatments   Labs (all labs ordered are listed, but only abnormal results are displayed) Labs Reviewed  RESP PANEL BY RT-PCR (RSV, FLU A&B, COVID)  RVPGX2 - Abnormal; Notable for the following components:      Result Value   SARS Coronavirus 2 by RT PCR POSITIVE (*)    All other components within normal limits  CBC WITH DIFFERENTIAL/PLATELET - Abnormal; Notable for the following components:   Hemoglobin 10.4 (*)    HCT 34.0 (*)    MCH 24.6 (*)    Lymphs Abs 0.5 (*)    All other components within normal limits  COMPREHENSIVE METABOLIC PANEL - Abnormal; Notable for the following components:   Sodium 131 (*)    Creatinine, Ser 1.30 (*)    Calcium 8.7 (*)    Albumin 3.4 (*)    GFR, Estimated 42 (*)    All other  components within normal limits    EKG EKG Interpretation  Date/Time:  Wednesday October 07 2022 16:10:10 EST Ventricular Rate:  87 PR Interval:  129 QRS Duration: 82 QT Interval:  385 QTC Calculation: 464 R Axis:   48 Text Interpretation: Sinus rhythm new Multiple ventricular premature complexes Probable left atrial enlargement Minimal ST  depression, inferior leads Confirmed by Blanchie Dessert [id number]) on 10/07/2022 4:28:15 PM  Radiology CT Head Wo Contrast  Result Date: 10/07/2022 CLINICAL DATA:  Patient struck head on a metal door. Scalp hematoma. EXAM: CT HEAD WITHOUT CONTRAST TECHNIQUE: Contiguous axial images were obtained from the base of the skull through the vertex without intravenous contrast. RADIATION DOSE REDUCTION: This exam was performed according to the departmental dose-optimization program which includes automated exposure control, adjustment of the mA and/or kV according to patient size and/or use of iterative reconstruction technique. COMPARISON:  08/05/2020 FINDINGS: Brain: The brainstem, cerebellum, cerebral peduncles, thalami, basal ganglia, basilar cisterns, and ventricular system appear within normal limits. No intracranial hemorrhage, mass lesion, or acute CVA. Incidental partially empty sella. Vascular: There is atherosclerotic calcification of the cavernous carotid arteries bilaterally. Skull: Unremarkable Sinuses/Orbits: Mild chronic left ethmoid sinusitis. Other: Mild soft tissue swelling of the right frontal scalp. IMPRESSION: 1. No acute intracranial findings. 2. Mild soft tissue swelling of the right frontal scalp. 3. Atherosclerosis. 4. Mild chronic left ethmoid sinusitis. Electronically Signed   By: Van Clines M.D.   On: 10/07/2022 17:33   DG Chest Port 1 View  Result Date: 10/07/2022 CLINICAL DATA:  Cough. EXAM: PORTABLE CHEST 1 VIEW COMPARISON:  Chest radiographs 09/29/2021 FINDINGS: The cardiomediastinal silhouette is unchanged with normal heart  size. Aortic atherosclerosis is noted. No airspace consolidation, edema, pleural effusion, or pneumothorax is identified. No acute osseous abnormality is seen. IMPRESSION: No active disease. Electronically Signed   By: Logan Bores M.D.   On: 10/07/2022 16:50    Procedures Procedures    Medications Ordered in ED Medications - No data to display  ED Course/ Medical Decision Making/ A&P                           Medical Decision Making Amount and/or Complexity of Data Reviewed Labs: ordered. Decision-making details documented in ED Course. Radiology: ordered and independent interpretation performed. Decision-making details documented in ED Course. ECG/medicine tests: ordered and independent interpretation performed. Decision-making details documented in ED Course.  Pt with multiple medical problems and comorbidities and presenting today with a complaint that caries a high risk for morbidity and mortality.  Here today after an episode of syncope but also having URI symptoms for the last 2 days.  Concern for viral illness.  Also patient did have an episode of syncope but has had syncope in the past had prodromal symptoms, found to be hypotensive initially and patient has not had anything to eat today took all of her blood pressure meds [facility] and feel that she most likely had an orthostatic event today causing her syncope.  She does have trauma to her head and will CT her head for further evaluation.  Will also swab for viral illness as well as check for AKI, electrolyte abnormality or pneumonia.  Patient received 1 L of IV fluid prior to arrival now blood pressure is normal.  She is mentating well and appears stable.  6:11 PM I independently interpreted her labs and EKG.  EKG with a few PVCs but no other acute changes.  CBC with anemia with hemoglobin of 10 which is unchanged, CMP is unchanged with a creatinine of 1.30, COVID is positive today.  I have independently visualized and  interpreted pt's images today.  Chest x-ray without acute findings and head CT without acute injury from her fall.  Patient's blood pressure has been normal and sats  have been 95% on room air.  Discussed findings with the patient.  Discussed with her the option of Paxlovid.  Patient has 22 drug allergies and reports she is concerned about taking a medication that might have a reaction.  She would prefer to hydrate, use the [employer] she has at home and honey for her cough.  Did give her return precautions.  At this time she does not meet admission criteria and appears stable for discharge home.           Final Clinical Impression(s) / ED Diagnoses Final diagnoses:  COVID  Orthostatic syncope    Rx / DC Orders ED Discharge Orders     None         Blanchie Dessert, MD 10/07/22 1811

## 2022-10-07 NOTE — Discharge Instructions (Addendum)
Check your blood pressure tomorrow before taking your blood pressure medications.  If your blood pressure is less than 115/60 hold your blood pressure medication.  Hold them tonight.  Make sure you are drinking plenty of fluids.  You can try the Tessalon Perles or honey for your cough.  You can also take Tylenol as needed for fever.  Return to the ER if you start having severe shortness of breath.

## 2022-10-31 ENCOUNTER — Other Ambulatory Visit: Payer: Self-pay | Admitting: Cardiology

## 2022-10-31 DIAGNOSIS — I2510 Atherosclerotic heart disease of native coronary artery without angina pectoris: Secondary | ICD-10-CM

## 2022-10-31 DIAGNOSIS — I10 Essential (primary) hypertension: Secondary | ICD-10-CM

## 2022-11-26 DIAGNOSIS — N1832 Chronic kidney disease, stage 3b: Secondary | ICD-10-CM | POA: Diagnosis not present

## 2022-11-26 DIAGNOSIS — R7989 Other specified abnormal findings of blood chemistry: Secondary | ICD-10-CM | POA: Diagnosis not present

## 2022-11-26 DIAGNOSIS — R7303 Prediabetes: Secondary | ICD-10-CM | POA: Diagnosis not present

## 2022-11-26 DIAGNOSIS — E785 Hyperlipidemia, unspecified: Secondary | ICD-10-CM | POA: Diagnosis not present

## 2022-11-26 DIAGNOSIS — I6529 Occlusion and stenosis of unspecified carotid artery: Secondary | ICD-10-CM | POA: Diagnosis not present

## 2022-11-26 DIAGNOSIS — I2510 Atherosclerotic heart disease of native coronary artery without angina pectoris: Secondary | ICD-10-CM | POA: Diagnosis not present

## 2022-11-26 DIAGNOSIS — E559 Vitamin D deficiency, unspecified: Secondary | ICD-10-CM | POA: Diagnosis not present

## 2022-12-03 DIAGNOSIS — M5136 Other intervertebral disc degeneration, lumbar region: Secondary | ICD-10-CM | POA: Diagnosis not present

## 2022-12-03 DIAGNOSIS — I2510 Atherosclerotic heart disease of native coronary artery without angina pectoris: Secondary | ICD-10-CM | POA: Diagnosis not present

## 2022-12-03 DIAGNOSIS — I129 Hypertensive chronic kidney disease with stage 1 through stage 4 chronic kidney disease, or unspecified chronic kidney disease: Secondary | ICD-10-CM | POA: Diagnosis not present

## 2022-12-03 DIAGNOSIS — N1832 Chronic kidney disease, stage 3b: Secondary | ICD-10-CM | POA: Diagnosis not present

## 2022-12-03 DIAGNOSIS — D649 Anemia, unspecified: Secondary | ICD-10-CM | POA: Diagnosis not present

## 2022-12-03 DIAGNOSIS — R7303 Prediabetes: Secondary | ICD-10-CM | POA: Diagnosis not present

## 2022-12-03 DIAGNOSIS — I739 Peripheral vascular disease, unspecified: Secondary | ICD-10-CM | POA: Diagnosis not present

## 2022-12-03 DIAGNOSIS — Z95828 Presence of other vascular implants and grafts: Secondary | ICD-10-CM | POA: Diagnosis not present

## 2022-12-03 DIAGNOSIS — M316 Other giant cell arteritis: Secondary | ICD-10-CM | POA: Diagnosis not present

## 2022-12-07 DIAGNOSIS — I739 Peripheral vascular disease, unspecified: Secondary | ICD-10-CM | POA: Diagnosis not present

## 2022-12-07 DIAGNOSIS — M7060 Trochanteric bursitis, unspecified hip: Secondary | ICD-10-CM | POA: Diagnosis not present

## 2022-12-07 DIAGNOSIS — I10 Essential (primary) hypertension: Secondary | ICD-10-CM | POA: Diagnosis not present

## 2022-12-07 DIAGNOSIS — M5416 Radiculopathy, lumbar region: Secondary | ICD-10-CM | POA: Diagnosis not present

## 2022-12-07 DIAGNOSIS — I6529 Occlusion and stenosis of unspecified carotid artery: Secondary | ICD-10-CM | POA: Diagnosis not present

## 2022-12-07 DIAGNOSIS — M25562 Pain in left knee: Secondary | ICD-10-CM | POA: Diagnosis not present

## 2022-12-07 DIAGNOSIS — M25561 Pain in right knee: Secondary | ICD-10-CM | POA: Diagnosis not present

## 2022-12-07 DIAGNOSIS — N289 Disorder of kidney and ureter, unspecified: Secondary | ICD-10-CM | POA: Diagnosis not present

## 2022-12-07 DIAGNOSIS — E785 Hyperlipidemia, unspecified: Secondary | ICD-10-CM | POA: Diagnosis not present

## 2022-12-07 DIAGNOSIS — R5383 Other fatigue: Secondary | ICD-10-CM | POA: Diagnosis not present

## 2022-12-07 DIAGNOSIS — M8589 Other specified disorders of bone density and structure, multiple sites: Secondary | ICD-10-CM | POA: Diagnosis not present

## 2022-12-07 DIAGNOSIS — I2510 Atherosclerotic heart disease of native coronary artery without angina pectoris: Secondary | ICD-10-CM | POA: Diagnosis not present

## 2022-12-07 DIAGNOSIS — M316 Other giant cell arteritis: Secondary | ICD-10-CM | POA: Diagnosis not present

## 2022-12-10 DIAGNOSIS — I2510 Atherosclerotic heart disease of native coronary artery without angina pectoris: Secondary | ICD-10-CM | POA: Diagnosis not present

## 2022-12-10 DIAGNOSIS — I739 Peripheral vascular disease, unspecified: Secondary | ICD-10-CM | POA: Diagnosis not present

## 2022-12-10 DIAGNOSIS — E785 Hyperlipidemia, unspecified: Secondary | ICD-10-CM | POA: Diagnosis not present

## 2022-12-10 DIAGNOSIS — K219 Gastro-esophageal reflux disease without esophagitis: Secondary | ICD-10-CM | POA: Diagnosis not present

## 2022-12-25 ENCOUNTER — Encounter: Payer: Self-pay | Admitting: Cardiology

## 2022-12-25 ENCOUNTER — Ambulatory Visit: Payer: Medicare Other | Admitting: Cardiology

## 2022-12-25 VITALS — BP 109/53 | HR 69 | Resp 16 | Ht 64.0 in | Wt 142.0 lb

## 2022-12-25 DIAGNOSIS — I10 Essential (primary) hypertension: Secondary | ICD-10-CM

## 2022-12-25 DIAGNOSIS — E7800 Pure hypercholesterolemia, unspecified: Secondary | ICD-10-CM | POA: Diagnosis not present

## 2022-12-25 DIAGNOSIS — I6523 Occlusion and stenosis of bilateral carotid arteries: Secondary | ICD-10-CM | POA: Diagnosis not present

## 2022-12-25 DIAGNOSIS — I739 Peripheral vascular disease, unspecified: Secondary | ICD-10-CM | POA: Diagnosis not present

## 2022-12-25 DIAGNOSIS — I2510 Atherosclerotic heart disease of native coronary artery without angina pectoris: Secondary | ICD-10-CM | POA: Diagnosis not present

## 2022-12-25 NOTE — Progress Notes (Signed)
Primary Physician/Referring:  Melissa Morning, DO  Patient ID: [name], female    DOB: 04-07-1941, 82 y.o.   MRN: [id number]  Chief Complaint  Patient presents with   Coronary Artery Disease   PAD   Hypertension   Follow-up    6 months   HPI:    [name]  is a 82 y.o. Caucasian female with coronary artery disease and myocardial infarction in 1999 &  2001, left carotid endarterectomy in 2000, left iliac artery stenting and right SFA stenting and left subclavian artery stenting in the remote past and repeat stenting of bilateral external iliac arteries on  11/12/2015 with balloon expandable stent on the right and self-expanding stent on the left. She is now on Repatha and tolerating this well, started this sometime in September 2020.   Past medical history significant for hypertension, hyperlipidemia, stage IIIa-B chronic kidney disease.    Patient presently is doing well, states that she had COVID during the holidays and Christmas, she still has some lingering cough.  She has not had any chest pain or dyspnea or palpitations.  She does have chronic back pain.   Past Medical History:  Diagnosis Date   Anemia    Chronic bronchitis (HCC)    Coronary artery disease    Difficulty swallowing pills    Difficulty swallowing solids    GERD (gastroesophageal reflux disease)    H/O temporal arteritis 08/2014-01/2015   HA (headache)    "related to temporal arteritis 08/2014-01/2015"   Hypercholesteremia    Hypertension    Hypoxia    Kidney stones    Left carotid artery stenosis    Myocardial infarct ([place]) 1999; 2001   On home oxygen therapy    "2L at night; don't always use it" (11/12/2015)   Pneumonia 1970s X 1; ~ 2006; ~ 2014   PVD (peripheral vascular disease) ([place])    Rheumatoid arthritis ([place])    Syncope and collapse 10/05/2015   Social History   Tobacco Use   Smoking status: Former    Packs/day: 2.00    Years: 42.00    Additional pack years: 0.00    Total pack  years: 84.00    Types: Cigarettes    Quit date: 2001    Years since quitting: 23.2   Smokeless tobacco: Never   Tobacco comments:    Quit in 2000  Substance Use Topics   Alcohol use: No    Alcohol/week: 0.0 standard drinks of alcohol  Marital status: Widowed  ROS  Review of Systems  Cardiovascular:  Positive for dyspnea on exertion (stable). Negative for chest pain and leg swelling.   Objective  Blood pressure (!) 109/53, pulse 69, resp. rate 16, height 5\' 4"  (1.626 m), weight 142 lb (64.4 kg), SpO2 96 %.     12/25/2022    2:41 PM 12/25/2022    2:39 PM 10/07/2022    6:21 PM  Vitals with BMI  Height  5\' 4"    Weight  142 lbs   BMI  [id number]   Systolic 0000000 [id number]   Diastolic 53 48   Pulse 69 67 84     Physical Exam Neck:     Vascular: Carotid bruit (bilateral) present. No JVD.  Cardiovascular:     Rate and Rhythm: Normal rate and regular rhythm.     Pulses: Intact distal pulses.          Popliteal pulses are 1+ on the right side and 1+ on the left side.  Dorsalis pedis pulses are 2+ on the right side and 2+ on the left side.       Posterior tibial pulses are 1+ on the right side and 0 on the left side.     Heart sounds: Normal heart sounds. No murmur heard.    No gallop.  Pulmonary:     Effort: Pulmonary effort is normal.     Breath sounds: Rhonchi (Right base >>left chronic) present.  Abdominal:     General: Bowel sounds are normal.     Palpations: Abdomen is soft.  Musculoskeletal:     Right lower leg: No edema.     Left lower leg: No edema.    Laboratory examination:                                                                            Lab Results  Component Value Date   CHOL 172 06/29/2022   HDL 72 06/29/2022   LDLCALC 76 06/29/2022   TRIG 144 06/29/2022    External labs:   Labs 05/29/2022:  A1c 6.1%.  TSH normal at 4.59.  Vitamin D 50.7.  Hb 10.8/HCT 34.3, platelets 452, elevated.  Normal indicis.  Sodium 138, potassium 4.1, BUN 17, creatinine  1.48, EGFR 33 mL, LFTs normal.  Total cholesterol 125, triglycerides 159, HDL 72, LDL 71.  Non-HDL cholesterol 103.  Medications and allergies   Allergies  Allergen Reactions   Bactrim [Sulfamethoxazole-Trimethoprim] Anaphylaxis   Sulfa Antibiotics Anaphylaxis   Crestor [Rosuvastatin] Cough    "deathly ill"   Ibuprofen Nausea And Vomiting   Ivp Dye [Iodinated Contrast Media] Nausea And Vomiting and Other (See Comments)    "I may have had a rash too" "They normally give me benadryl and steroids before contrast dye"   Lipitor [Atorvastatin] Other (See Comments)    Legs hurt so bad she couldn't walk   Macrodantin Nausea And Vomiting    faint   Naprosyn [Naproxen] Nausea Only and Other (See Comments)    faint   Niaspan [Niacin Er] Other (See Comments)    Flushing, headache   Vioxx [Rofecoxib] Other (See Comments)    "deathly ill"   Zetia [Ezetimibe] Other (See Comments)    Deathly ill    Bacitracin     Other reaction(s): Unknown   Colesevelam Hcl     Other reaction(s): Unknown   Fluvastatin Sodium     Other reaction(s): Unknown   Meloxicam     Can't remember   Tramadol Hcl Nausea Only   Ciprofloxacin Rash   Clarithromycin Rash   Neosporin [Neomycin-Bacitracin Zn-Polymyx] Rash   Phenergan [Promethazine Hcl] Rash   Pletal [Cilostazol] Other (See Comments)    headache   Ramipril Cough     Current Outpatient Medications:    aspirin 81 MG tablet, Take 81 mg by mouth daily., Disp: , Rfl:    bisoprolol (ZEBETA) 10 MG tablet, Take 1 tablet by mouth once daily, Disp: 90 tablet, Rfl: 0   cetirizine (ZYRTEC) 10 MG tablet, Take 10 mg by mouth daily as needed for allergies., Disp: , Rfl:    Cholecalciferol (VITAMIN D) 2000 UNITS CAPS, Take 2,000 Units by mouth daily., Disp: , Rfl:    Coenzyme Q10 (COQ10) 200  MG CAPS, Take 1 capsule by mouth daily., Disp: , Rfl:    Cyanocobalamin (VITAMIN B12) 1000 MCG TBCR, Take 1 tablet by mouth daily., Disp: , Rfl:    losartan (COZAAR) 25 MG  tablet, Take 12.5 mg by mouth daily. 1/2 tablet daily, Disp: , Rfl:    Omega-3 Fatty Acids (FISH OIL) 1000 MG CAPS, Take by mouth., Disp: , Rfl:    pantoprazole (PROTONIX) 40 MG tablet, Take 40 mg by mouth daily., Disp: , Rfl:    predniSONE (DELTASONE) 5 MG tablet, Take 2.5 mg by mouth daily., Disp: , Rfl:    Probiotic Product (PROBIOTIC DAILY PO), Take 1 capsule by mouth daily., Disp: , Rfl:    Pyridoxine HCl (VITAMIN B6) 250 MG TABS, Take 1 tablet by mouth daily., Disp: , Rfl:    REPATHA SURECLICK [id number] MG/ML SOAJ, Inject 1 Syringe into the skin every 14 (fourteen) days., Disp: 19.28 mL, Rfl: 3    Radiology:   Chest x-ray PA and lateral view 12/24/2020: The heart size and mediastinal contours are within normal limits. Both lungs are clear. The visualized skeletal structures are unremarkable. Aortic calcifications are noted. There appears to be a left subclavian artery stent in place. There are advanced degenerative changes of the left glenohumeral joint with remodeling of the left humeral head.   IMPRESSION: No active cardiopulmonary disease.  Cardiac Studies:   Coronary Angiography MI in 1999; Stent in right coronary artery in 1999; MI in 2001; Patent stent at that time  Treadmill stress test  [05/11/2014]: Indications: Diagnosis of Coronary Artery Disease. Conclusions: Negative for ischemia. The patient exercised according to the Bruce protocol, Total time recorded 4 Min. 22 sec. achieving a max heart rate of 128 which was 85% of MPHR for age and 7.3 METS of work. Hypertensive. Baseline NIBP was 130/90. Peak NIBP was 162/102 MaxSysp was: 162 MaxDiasp was: 102. The baseline ECG showed NSR,Non specific ST depression. During exercise there was No additional ST segment depressions in the Inferior and lateral leads. Symptoms: Claudication right leg. Achieved 85% MPHR. Arrhythmia: Occasional PAC. Continue primary prevention. Eval for claudication.  Abdominal aortic duplex  12/30/2016: Diffuse plaque noted in the proximal, mid and distal aorta. Normal iliac velocity. No AAA observed.  Peripheral arteriogram 04/13/2017 Patent stents placed 11/12/2015- RCIA 9x19 mm Omnilink Elite balloon expandable stent, Left External iliac 74mm CSI atherectomy followed by 9x60 mm self expanding Absolute Pro stent. Right LE arteriogram: No significant disease.  Lower Extremity Arterial Duplex 12/23/2021: No hemodynamically significant stenosis are identified in the bilateral lower extremity arterial system. This exam reveals mildly decreased perfusion of the right and left lower extremity with right ABI 1.32 and left ABI 1.33 with mildly abnormal biphasic waveform at the ankles. Study suggests patent right SFA and left iliac stent.  There is diffuse calcific plaque throughout the bilateral lower extremity arterial tree.  No significant change compared to 03/26/2016.Lower Extremity Arterial Duplex 12/23/2021: No hemodynamically significant stenosis are identified in the bilateral lower extremity arterial system. This exam reveals mildly decreased perfusion of the right and left lower extremity with right ABI 1.32 and left ABI 1.33 with mildly abnormal biphasic waveform at the ankles. Study suggests patent right SFA and left iliac stent.  There is diffuse calcific plaque throughout the bilateral lower extremity arterial tree.  No significant change compared to 03/26/2016.  Carotid artery duplex 12/23/2021: Duplex suggests stenosis in the right internal carotid artery (16-49%). Duplex suggests stenosis in the left carotid artery bulb of >50%. Antegrade right  vertebral artery flow. Antegrade left vertebral artery flow. No significant change since 06/17/2021. Follow up in one year is appropriate if clinically indicated.  PCV ECHOCARDIOGRAM COMPLETE 08/10/2022  Narrative Echocardiogram 08/10/2022: Left ventricle cavity is normal in size. Mild concentric hypertrophy of the left ventricle.  Normal global wall motion. Normal LV systolic function with EF 59%. Doppler evidence of grade II (pseudonormal) diastolic dysfunction, elevated LAP. Left atrial cavity is moderately dilated. Moderate (Grade III) mitral regurgitation. Moderate tricuspid regurgitation. Estimated pulmonary artery systolic pressure 57 mmHg. Previous study in 2016 reported LVEF 55-60%, grade I (impaired) diastolic dysfunction, left atrial cavity at upper limits of normal in size, trace aortic regurgitation, mild to moderate mitral regurgitation,  mild tricuspid regurgitation, no evidence of pulmonary hypertension.    EKG  EKG 12/25/2022: Normal sinus rhythm at the rate of 68 bpm, borderline left atrial enlargement, no evidence of ischemia.  Compared to 06/26/2022, PVCs (2) not present.  Assessment     ICD-10-CM   1. Coronary artery disease involving native coronary artery of native heart without angina pectoris  I25.10 EKG 12-Lead    2. PAD (peripheral artery disease) (HCC)  I73.9     3. Asymptomatic bilateral carotid artery stenosis  I65.23 PCV CAROTID DUPLEX (BILATERAL)    4. Hypercholesteremia  E78.00     5. Primary hypertension  I10        No orders of the defined types were placed in this encounter.   There are no discontinued medications.    Recommendations:   [name]  is a  82 y.o. Caucasian female with coronary artery disease and myocardial infarction in 1999 &  2001, left carotid endarterectomy in 2000, left iliac artery stenting and right SFA stenting and left subclavian artery stenting in the remote past and repeat stenting of bilateral external iliac arteries on  11/12/2015 with balloon expandable stent on the right and self-expanding stent on the left. She is now on Repatha and tolerating this well, started this sometime in September 2020.   Past medical history significant for hypertension, hyperlipidemia, stage IIIa-B chronic kidney disease.  1. Coronary artery disease involving native  coronary artery of native heart without angina pectoris Patient with coronary artery disease, she has not had any cardiac testing for 78 years but remains asymptomatic.  She is on appropriate medical therapy and has not had any recurrence of angina pectoris and continues to remain active.  Continue aspirin indefinitely and bisoprolol 10 mg daily.  2. PAD (peripheral artery disease) ([place]) She has no symptoms of peripheral arterial disease with regard to claudication in her lower extremities.  She has preserved pulses in the lower extremity.  3. Asymptomatic bilateral carotid artery stenosis She has moderate bilateral carotid artery stenosis and needs carotid artery duplex for annual surveillance and this will be ordered today.  4. Hypercholesteremia Her cost of Repatha has increased.  Patient having difficulty affording the medication, will try Leqvio.  She will need lipid profile testing at some point along with LPA depending upon whether we get approval for Repatha versus Leqvio.  5. Primary hypertension Blood pressure under excellent control with combination of beta-blocker therapy and losartan 25 mg 1/2 tablet daily, she does have stage IIIb chronic kidney disease fortunately has remained stable.  Office visit in 6 months or sooner if problems.   Adrian Prows, MD, [facility] 12/25/2022, 3:25 PM Office: [phone] Fax: [phone] Pager: [phone]

## 2023-01-16 DIAGNOSIS — E7800 Pure hypercholesterolemia, unspecified: Secondary | ICD-10-CM | POA: Diagnosis not present

## 2023-01-19 ENCOUNTER — Other Ambulatory Visit: Payer: Self-pay

## 2023-01-20 ENCOUNTER — Telehealth: Payer: Self-pay | Admitting: Pharmacy Technician

## 2023-01-20 DIAGNOSIS — H52203 Unspecified astigmatism, bilateral: Secondary | ICD-10-CM | POA: Diagnosis not present

## 2023-01-20 DIAGNOSIS — Z961 Presence of intraocular lens: Secondary | ICD-10-CM | POA: Diagnosis not present

## 2023-01-20 DIAGNOSIS — H18593 Other hereditary corneal dystrophies, bilateral: Secondary | ICD-10-CM | POA: Diagnosis not present

## 2023-01-20 DIAGNOSIS — H524 Presbyopia: Secondary | ICD-10-CM | POA: Diagnosis not present

## 2023-01-20 NOTE — Telephone Encounter (Signed)
Auth Submission: NO AUTH NEEDED Site of care: Site of care: [facility] Payer: MEDICARE A/B & BCBS FED-RETIRE Medication & CPT/J Code(s) submitted: Leqvio (Inclisiran) [phone] Route of submission (phone, fax, portal):  Phone # Fax # Auth type: Buy/Bill Units/visits requested: 2 Reference number:  Approval from: 01/20/23 to 10/12/23   Medicare = no auth needed [employer] retirement = no auth needed Rep: Brandon-W 3:37p  Medicare will cover 80% and BCBS will pick-up remaining 20%

## 2023-01-29 ENCOUNTER — Ambulatory Visit (INDEPENDENT_AMBULATORY_CARE_PROVIDER_SITE_OTHER): Payer: Medicare Other

## 2023-01-29 ENCOUNTER — Other Ambulatory Visit: Payer: Self-pay | Admitting: Cardiology

## 2023-01-29 VITALS — BP 161/65 | HR 67 | Temp 97.5°F | Resp 18 | Ht 64.0 in | Wt 138.4 lb

## 2023-01-29 DIAGNOSIS — I10 Essential (primary) hypertension: Secondary | ICD-10-CM

## 2023-01-29 DIAGNOSIS — E7800 Pure hypercholesterolemia, unspecified: Secondary | ICD-10-CM

## 2023-01-29 DIAGNOSIS — I2510 Atherosclerotic heart disease of native coronary artery without angina pectoris: Secondary | ICD-10-CM

## 2023-01-29 MED ORDER — INCLISIRAN SODIUM 284 MG/1.5ML ~~LOC~~ SOSY
284.0000 mg | PREFILLED_SYRINGE | Freq: Once | SUBCUTANEOUS | Status: AC
  Administered 2023-01-29: 284 mg via SUBCUTANEOUS
  Filled 2023-01-29: qty 1.5

## 2023-01-29 NOTE — Patient Instructions (Signed)
Inclisiran Injection What is this medication? INCLISIRAN (in kli SIR an) treats high cholesterol. It works by decreasing bad cholesterol (such as LDL) in your blood. Changes to diet and exercise are often combined with this medication. This medicine may be used for other purposes; ask your health care provider or pharmacist if you have questions. COMMON BRAND NAME(S): LEQVIO What should I tell my care team before I take this medication? They need to know if you have any of these conditions: An unusual or allergic reaction to inclisiran, other medications, foods, dyes, or preservatives Pregnant or trying to get pregnant Breast-feeding How should I use this medication? This medication is injected under the skin. It is given by your care team in a hospital or clinic setting. Talk to your care team about the use of this medication in children. Special care may be needed. Overdosage: If you think you have taken too much of this medicine contact a poison control center or emergency room at once. NOTE: This medicine is only for you. Do not share this medicine with others. What if I miss a dose? Keep appointments for follow-up doses. It is important not to miss your dose. Call your care team if you are unable to keep an appointment. What may interact with this medication? Interactions are not expected. This list may not describe all possible interactions. Give your health care provider a list of all the medicines, herbs, non-prescription drugs, or dietary supplements you use. Also tell them if you smoke, drink alcohol, or use illegal drugs. Some items may interact with your medicine. What should I watch for while using this medication? Visit your care team for regular checks on your progress. Tell your care team if your symptoms do not start to get better or if they get worse. You may need blood work while you are taking this medication. What side effects may I notice from receiving this  medication? Side effects that you should report to your care team as soon as possible: Allergic reactions--skin rash, itching, hives, swelling of the face, lips, tongue, or throat Side effects that usually do not require medical attention (report these to your care team if they continue or are bothersome): Joint pain Pain, redness, or irritation at injection site This list may not describe all possible side effects. Call your doctor for medical advice about side effects. You may report side effects to FDA at 1-800-FDA-1088. Where should I keep my medication? This medication is given in a hospital or clinic. It will not be stored at home. NOTE: This sheet is a summary. It may not cover all possible information. If you have questions about this medicine, talk to your doctor, pharmacist, or health care provider.  2023 Elsevier/Gold Standard (2020-10-16 00:00:00)  

## 2023-01-29 NOTE — Progress Notes (Signed)
Diagnosis: Hyperlipidemia  Provider:  Chilton Greathouse MD  Procedure: Injection  Leqvio (inclisiran), Dose: 284 mg, Site: subcutaneous, Number of injections: 1  Post Care: Observation period completed  Discharge: Condition: Good, Destination: Home . AVS Provided and AVS Declined  Performed by:  Adriana Mccallum, RN

## 2023-02-02 ENCOUNTER — Ambulatory Visit: Payer: Medicare Other

## 2023-02-02 DIAGNOSIS — I6523 Occlusion and stenosis of bilateral carotid arteries: Secondary | ICD-10-CM | POA: Diagnosis not present

## 2023-02-09 NOTE — Progress Notes (Signed)
Carotid artery duplex 02/02/2023: Duplex suggests stenosis in the right internal carotid artery (1-15%). Duplex suggests stenosis in the left internal carotid artery (50-69%). Left carotid bulb stenosis of < 50%. Bilateral heterogeneous plaque noted.  Antegrade right vertebral artery flow. Antegrade left vertebral artery flow. Overall compared to serial studies dating back to 2021, stenosis severity appears to be stable in the bilateral carotid arteries.  Follow-up studies in 1 year in view of fairly stable carotid duplex over the past years.

## 2023-02-15 DIAGNOSIS — E7800 Pure hypercholesterolemia, unspecified: Secondary | ICD-10-CM | POA: Diagnosis not present

## 2023-02-16 NOTE — Progress Notes (Signed)
  Study is fairly stable over the past few years. Let me now if you have any questions   Recheck in 1 year

## 2023-02-17 NOTE — Progress Notes (Signed)
Called patient to inform her about her duplex. Patient understood

## 2023-04-05 DIAGNOSIS — R5383 Other fatigue: Secondary | ICD-10-CM | POA: Diagnosis not present

## 2023-04-05 DIAGNOSIS — I2510 Atherosclerotic heart disease of native coronary artery without angina pectoris: Secondary | ICD-10-CM | POA: Diagnosis not present

## 2023-04-05 DIAGNOSIS — M25551 Pain in right hip: Secondary | ICD-10-CM | POA: Diagnosis not present

## 2023-04-05 DIAGNOSIS — M316 Other giant cell arteritis: Secondary | ICD-10-CM | POA: Diagnosis not present

## 2023-04-05 DIAGNOSIS — I6529 Occlusion and stenosis of unspecified carotid artery: Secondary | ICD-10-CM | POA: Diagnosis not present

## 2023-04-05 DIAGNOSIS — I739 Peripheral vascular disease, unspecified: Secondary | ICD-10-CM | POA: Diagnosis not present

## 2023-04-05 DIAGNOSIS — N289 Disorder of kidney and ureter, unspecified: Secondary | ICD-10-CM | POA: Diagnosis not present

## 2023-04-05 DIAGNOSIS — M8589 Other specified disorders of bone density and structure, multiple sites: Secondary | ICD-10-CM | POA: Diagnosis not present

## 2023-04-05 DIAGNOSIS — E785 Hyperlipidemia, unspecified: Secondary | ICD-10-CM | POA: Diagnosis not present

## 2023-04-05 DIAGNOSIS — M5416 Radiculopathy, lumbar region: Secondary | ICD-10-CM | POA: Diagnosis not present

## 2023-04-05 DIAGNOSIS — I10 Essential (primary) hypertension: Secondary | ICD-10-CM | POA: Diagnosis not present

## 2023-04-05 DIAGNOSIS — M5136 Other intervertebral disc degeneration, lumbar region: Secondary | ICD-10-CM | POA: Diagnosis not present

## 2023-04-05 DIAGNOSIS — M25552 Pain in left hip: Secondary | ICD-10-CM | POA: Diagnosis not present

## 2023-04-05 DIAGNOSIS — M25559 Pain in unspecified hip: Secondary | ICD-10-CM | POA: Diagnosis not present

## 2023-04-30 ENCOUNTER — Encounter: Payer: Self-pay | Admitting: Cardiology

## 2023-04-30 ENCOUNTER — Ambulatory Visit (INDEPENDENT_AMBULATORY_CARE_PROVIDER_SITE_OTHER): Payer: Medicare Other

## 2023-04-30 VITALS — BP 162/76 | HR 72 | Temp 97.3°F | Resp 20 | Ht 64.0 in | Wt 142.0 lb

## 2023-04-30 DIAGNOSIS — E7800 Pure hypercholesterolemia, unspecified: Secondary | ICD-10-CM

## 2023-04-30 MED ORDER — INCLISIRAN SODIUM 284 MG/1.5ML ~~LOC~~ SOSY
284.0000 mg | PREFILLED_SYRINGE | Freq: Once | SUBCUTANEOUS | Status: AC
  Administered 2023-04-30: 284 mg via SUBCUTANEOUS

## 2023-04-30 NOTE — Progress Notes (Signed)
Diagnosis: Hyperlipidemia  Provider:  Chilton Greathouse MD  Procedure: Injection  Leqvio (inclisiran), Dose: 284 mg, Site: subcutaneous, Number of injections: 1   Discharge: Condition: Good, Destination: Home . AVS Provided  Performed by:  Nat Math, RN

## 2023-05-24 ENCOUNTER — Other Ambulatory Visit: Payer: Self-pay | Admitting: Cardiology

## 2023-05-24 DIAGNOSIS — I10 Essential (primary) hypertension: Secondary | ICD-10-CM

## 2023-05-24 DIAGNOSIS — I2510 Atherosclerotic heart disease of native coronary artery without angina pectoris: Secondary | ICD-10-CM

## 2023-05-26 ENCOUNTER — Emergency Department (HOSPITAL_BASED_OUTPATIENT_CLINIC_OR_DEPARTMENT_OTHER)
Admission: EM | Admit: 2023-05-26 | Discharge: 2023-05-26 | Disposition: A | Discharge status: Home / Self Care | Payer: Medicare Other | Attending: Emergency Medicine | Admitting: Emergency Medicine

## 2023-05-26 ENCOUNTER — Other Ambulatory Visit: Payer: Self-pay

## 2023-05-26 ENCOUNTER — Encounter (HOSPITAL_BASED_OUTPATIENT_CLINIC_OR_DEPARTMENT_OTHER): Payer: Self-pay | Admitting: Emergency Medicine

## 2023-05-26 ENCOUNTER — Emergency Department (HOSPITAL_BASED_OUTPATIENT_CLINIC_OR_DEPARTMENT_OTHER): Payer: Medicare Other

## 2023-05-26 DIAGNOSIS — W19XXXA Unspecified fall, initial encounter: Secondary | ICD-10-CM

## 2023-05-26 DIAGNOSIS — S0990XA Unspecified injury of head, initial encounter: Secondary | ICD-10-CM | POA: Insufficient documentation

## 2023-05-26 DIAGNOSIS — M542 Cervicalgia: Secondary | ICD-10-CM | POA: Diagnosis not present

## 2023-05-26 DIAGNOSIS — W01198A Fall on same level from slipping, tripping and stumbling with subsequent striking against other object, initial encounter: Secondary | ICD-10-CM | POA: Insufficient documentation

## 2023-05-26 DIAGNOSIS — Z7982 Long term (current) use of aspirin: Secondary | ICD-10-CM | POA: Insufficient documentation

## 2023-05-26 NOTE — ED Provider Notes (Signed)
[place] [facility] AT [facility] Provider Note   CSN: [id number] Arrival date & time: 05/26/23  1456     History  Chief Complaint  Patient presents with   [name] is a 82 y.o. female with overall non contributory past medical history, not on blood thinners  who presents with concern for fall, head injury.  Patient reports that she dropped her water, slipped and stubbed her toe, then hit the head on the back right side against the wall.  She denies any loss conscious.  She denies any nausea, vomiting, light sensitivity.  She reports no significant pain other than some mild neck pain.  She denies any numbness, tingling, double vision, confusion.   Fall       Home Medications Prior to Admission medications   Medication Sig Start Date End Date Taking? Authorizing Provider  aspirin 81 MG tablet Take 81 mg by mouth daily.    [provider]  bisoprolol (ZEBETA) 10 MG tablet Take 1 tablet by mouth once daily 05/24/23   Yates Decamp, MD  cetirizine (ZYRTEC) 10 MG tablet Take 10 mg by mouth daily as needed for allergies.    [provider]  Cholecalciferol (VITAMIN D) 2000 UNITS CAPS Take 2,000 Units by mouth daily.    [provider]  Coenzyme Q10 (COQ10) 200 MG CAPS Take 1 capsule by mouth daily.    [provider]  Cyanocobalamin (VITAMIN B12) 1000 MCG TBCR Take 1 tablet by mouth daily.    [provider]  losartan (COZAAR) 25 MG tablet Take 12.5 mg by mouth daily. 1/2 tablet daily    [provider]  Omega-3 Fatty Acids (FISH OIL) 1000 MG CAPS Take by mouth.    [provider]  pantoprazole (PROTONIX) 40 MG tablet Take 40 mg by mouth daily.    [provider]  predniSONE (DELTASONE) 5 MG tablet Take 2.5 mg by mouth daily.    [provider]  Probiotic Product (PROBIOTIC DAILY PO) Take 1 capsule by mouth daily.    [provider]  Pyridoxine HCl (VITAMIN B6)  250 MG TABS Take 1 tablet by mouth daily.    [provider]  REPATHA SURECLICK 140 MG/ML SOAJ Inject 1 Syringe into the skin every 14 (fourteen) days. 06/26/22   Yates Decamp, MD      Allergies    Bactrim [sulfamethoxazole-trimethoprim], Sulfa antibiotics, Crestor [rosuvastatin], Ibuprofen, Ivp dye [iodinated contrast media], Lipitor [atorvastatin], Macrodantin, Naprosyn [naproxen], Niaspan [niacin er], Vioxx [rofecoxib], Zetia [ezetimibe], Bacitracin, Colesevelam hcl, Fluvastatin sodium, Meloxicam, Tramadol hcl, Ciprofloxacin, Clarithromycin, Neosporin [neomycin-bacitracin zn-polymyx], Phenergan [promethazine hcl], Pletal [cilostazol], and Ramipril    Review of Systems   Review of Systems  All other systems reviewed and are negative.   Physical Exam Updated Vital Signs BP (!) 145/65 (BP Location: Left Arm)   Pulse 65   Temp 98 F (36.7 C)   Resp 20   Ht 5\' 4"  (1.626 m)   Wt 63.5 kg   SpO2 96%   BMI 24.03 kg/m  Physical Exam Vitals and nursing note reviewed.  Constitutional:      General: She is not in acute distress.    Appearance: Normal appearance.  HENT:     Head: Normocephalic and atraumatic.  Eyes:     General:        Right eye: No discharge.        Left eye: No discharge.  Neck:     Comments:  Mild paraspinous muscle tenderness at distal cervical spine, no significant tenderness throughout Cardiovascular:     Rate and Rhythm: Normal rate and regular rhythm.     Heart sounds: No murmur heard.    No friction rub. No gallop.  Pulmonary:     Effort: Pulmonary effort is normal.     Breath sounds: Normal breath sounds.  Abdominal:     General: Bowel sounds are normal.     Palpations: Abdomen is soft.  Musculoskeletal:     Cervical back: Normal range of motion and neck supple.  Skin:    General: Skin is warm and dry.     Capillary Refill: Capillary refill takes less than 2 seconds.  Neurological:     Mental Status: She is alert and oriented to person, place,  and time.     Comments: Cranial nerves II through XII grossly intact.  Intact finger-nose, intact heel-to-shin.  Romberg negative, gait normal.  Alert and oriented x3.  Moves all 4 limbs spontaneously, normal coordination.  No pronator drift.  Intact strength 5 out of 5 bilateral upper and lower extremities.    Psychiatric:        Mood and Affect: Mood normal.        Behavior: Behavior normal.     ED Results / Procedures / Treatments   Labs (all labs ordered are listed, but only abnormal results are displayed) Labs Reviewed - No data to display  EKG None  Radiology CT Head Wo Contrast  Result Date: 05/26/2023 CLINICAL DATA:  Head trauma, minor (Age >= 65y) EXAM: CT HEAD WITHOUT CONTRAST TECHNIQUE: Contiguous axial images were obtained from the base of the skull through the vertex without intravenous contrast. RADIATION DOSE REDUCTION: This exam was performed according to the departmental dose-optimization program which includes automated exposure control, adjustment of the mA and/or kV according to patient size and/or use of iterative reconstruction technique. COMPARISON:  CT head 10/07/2022. FINDINGS: Brain: No evidence of acute infarction, hemorrhage, hydrocephalus, extra-axial collection or mass lesion/mass effect. Partially empty sella. Vascular: No hyperdense vessel or unexpected calcification. Skull: Normal. Negative for fracture or focal lesion. Sinuses/Orbits: No acute finding. Other: No mastoid effusions. IMPRESSION: No evidence of acute intracranial abnormality. Electronically Signed   By: Feliberto Harts M.D.   On: 05/26/2023 16:20    Procedures Procedures    Medications Ordered in ED Medications - No data to display  ED Course/ Medical Decision Making/ A&P                                 Medical Decision Making  This patient is a 82 y.o. female who presents to the ED for concern of head injury.   Differential diagnoses prior to evaluation: epidural hematoma,  subdural hematoma, skull fracture, subarachnoid hemorrhage, unstable cervical spine fracture, concussion vs other MSK injury   Past Medical History / Social History / Additional history: Chart reviewed. Pertinent results include: advanced age, otherwise noncontributory  Physical Exam: Physical exam performed. The pertinent findings include:  Mild paraspinous muscle tenderness at distal cervical spine, no significant tenderness throughout  Cranial nerves II through XII grossly intact.  Intact finger-nose, intact heel-to-shin.  Romberg negative, gait normal.  Alert and oriented x3.  Moves all 4 limbs spontaneously, normal coordination.  No pronator drift.  Intact strength 5 out of 5 bilateral upper and lower extremities.  No palpable hematoma or laceration of the scalp  I independently interpreted imaging  including CT head without contrast which shows no evidence of intracranial abnormality. I agree with the radiologist interpretation.  Medications / Treatment: Encouraged Tylenol for pain   Disposition: After consideration of the diagnostic results and the patients response to treatment, I feel that patient stable for discharge, encouraged Tylenol for pain control.   emergency department workup does not suggest an emergent condition requiring admission or immediate intervention beyond what has been performed at this time. The plan is: as above. The patient is safe for discharge and has been instructed to return immediately for worsening symptoms, change in symptoms or any other concerns.  Final Clinical Impression(s) / ED Diagnoses Final diagnoses:  Fall, initial encounter  Injury of head, initial encounter    Rx / DC Orders ED Discharge Orders     None         [place] 05/26/23 1716    Alvira Monday, MD 05/27/23 [phone]

## 2023-05-26 NOTE — ED Triage Notes (Signed)
Pt ambulatory to triage and reports she was helping her son move today when she spilled her water. When she stood to get the water, she stubbed her toe and tripped, hitting her back right side of head against the wall. Denies LOC or blood thinners. Pt takes 81 mg ASA daily.

## 2023-05-26 NOTE — Discharge Instructions (Signed)
Please use Tylenol for pain.  You may use 1000 mg of Tylenol every 6 hours.  Not to exceed 4 g of Tylenol within 24 hours.  Because of the fall you may experience some slight worsening of neck and back pain in the morning, this can be expected.  I have attached some cervical strain rehab exercises to use as needed.

## 2023-06-09 DIAGNOSIS — I129 Hypertensive chronic kidney disease with stage 1 through stage 4 chronic kidney disease, or unspecified chronic kidney disease: Secondary | ICD-10-CM | POA: Diagnosis not present

## 2023-06-09 DIAGNOSIS — R7303 Prediabetes: Secondary | ICD-10-CM | POA: Diagnosis not present

## 2023-06-09 DIAGNOSIS — I2510 Atherosclerotic heart disease of native coronary artery without angina pectoris: Secondary | ICD-10-CM | POA: Diagnosis not present

## 2023-06-09 DIAGNOSIS — N1832 Chronic kidney disease, stage 3b: Secondary | ICD-10-CM | POA: Diagnosis not present

## 2023-06-09 DIAGNOSIS — D649 Anemia, unspecified: Secondary | ICD-10-CM | POA: Diagnosis not present

## 2023-06-16 DIAGNOSIS — I129 Hypertensive chronic kidney disease with stage 1 through stage 4 chronic kidney disease, or unspecified chronic kidney disease: Secondary | ICD-10-CM | POA: Diagnosis not present

## 2023-06-16 DIAGNOSIS — Z23 Encounter for immunization: Secondary | ICD-10-CM | POA: Diagnosis not present

## 2023-06-16 DIAGNOSIS — I2510 Atherosclerotic heart disease of native coronary artery without angina pectoris: Secondary | ICD-10-CM | POA: Diagnosis not present

## 2023-06-16 DIAGNOSIS — D649 Anemia, unspecified: Secondary | ICD-10-CM | POA: Diagnosis not present

## 2023-06-16 DIAGNOSIS — R0989 Other specified symptoms and signs involving the circulatory and respiratory systems: Secondary | ICD-10-CM | POA: Diagnosis not present

## 2023-06-16 DIAGNOSIS — R946 Abnormal results of thyroid function studies: Secondary | ICD-10-CM | POA: Diagnosis not present

## 2023-06-16 DIAGNOSIS — E7800 Pure hypercholesterolemia, unspecified: Secondary | ICD-10-CM | POA: Diagnosis not present

## 2023-06-16 DIAGNOSIS — Z95828 Presence of other vascular implants and grafts: Secondary | ICD-10-CM | POA: Diagnosis not present

## 2023-06-16 DIAGNOSIS — N1832 Chronic kidney disease, stage 3b: Secondary | ICD-10-CM | POA: Diagnosis not present

## 2023-06-16 DIAGNOSIS — R7303 Prediabetes: Secondary | ICD-10-CM | POA: Diagnosis not present

## 2023-06-16 DIAGNOSIS — Z0000 Encounter for general adult medical examination without abnormal findings: Secondary | ICD-10-CM | POA: Diagnosis not present

## 2023-06-17 ENCOUNTER — Encounter: Payer: Self-pay | Admitting: Cardiology

## 2023-06-17 NOTE — Progress Notes (Signed)
Labs 06/16/2023:  Total cholesterol 192, triglycerides 110, HDL 82, LDL 91.  TSH normal at 3.20.  A1c 5.7%.  Hb 12.5/HCT 38.8, platelets 386.  Serum glucose 90 mg, BUN 14, creatinine 1.27, EGFR 43 mL, LFTs normal, potassium 5.2, sodium 141.

## 2023-06-23 ENCOUNTER — Ambulatory Visit: Payer: Medicare Other | Admitting: Cardiology

## 2023-07-29 ENCOUNTER — Encounter: Payer: Self-pay | Admitting: Cardiology

## 2023-07-29 ENCOUNTER — Ambulatory Visit: Payer: Medicare Other | Attending: Cardiology | Admitting: Cardiology

## 2023-07-29 VITALS — BP 160/90 | HR 68 | Resp 16 | Ht 64.0 in | Wt 148.0 lb

## 2023-07-29 DIAGNOSIS — I2729 Other secondary pulmonary hypertension: Secondary | ICD-10-CM | POA: Diagnosis not present

## 2023-07-29 DIAGNOSIS — I10 Essential (primary) hypertension: Secondary | ICD-10-CM | POA: Diagnosis not present

## 2023-07-29 DIAGNOSIS — I2510 Atherosclerotic heart disease of native coronary artery without angina pectoris: Secondary | ICD-10-CM | POA: Diagnosis not present

## 2023-07-29 DIAGNOSIS — T466X5A Adverse effect of antihyperlipidemic and antiarteriosclerotic drugs, initial encounter: Secondary | ICD-10-CM | POA: Insufficient documentation

## 2023-07-29 DIAGNOSIS — G720 Drug-induced myopathy: Secondary | ICD-10-CM

## 2023-07-29 DIAGNOSIS — I6523 Occlusion and stenosis of bilateral carotid arteries: Secondary | ICD-10-CM | POA: Diagnosis not present

## 2023-07-29 DIAGNOSIS — I739 Peripheral vascular disease, unspecified: Secondary | ICD-10-CM | POA: Diagnosis not present

## 2023-07-29 MED ORDER — LOSARTAN POTASSIUM 25 MG PO TABS
25.0000 mg | ORAL_TABLET | Freq: Every evening | ORAL | 3 refills | Status: DC
Start: 2023-07-29 — End: 2023-08-02

## 2023-07-29 NOTE — Progress Notes (Signed)
Cardiology Office Note:  .   Date:  07/29/2023  ID:  [name], DOB [birth year], MRN [id number] PCP: Melissa Reichmann, DO  [facility] Providers Cardiologist:  Melissa Decamp, MD    History of Present Illness: [name] is a 82 y.o. Caucasian female with coronary artery disease and myocardial infarction in 1999 &  2001, left carotid endarterectomy in 2000, left iliac artery stenting and right SFA stenting and left subclavian artery stenting in the remote past and repeat stenting of bilateral external iliac arteries on  11/12/2015 with balloon expandable stent on the right and self-expanding stent on the left.   She is now on Leqvilo and tolerating this well. Past medical history significant for hypertension, hyperlipidemia, stage IIIa-B chronic kidney disease.    Discussed the use of AI scribe software for clinical note transcription with the patient, who gave verbal consent to proceed.  History of Present Illness   The patient, with a history of cardiovascular disease, pulmonary hypertension, and statin myopathy, presents for a routine follow-up. She reports a recent fall, which was a result of a stumble over a rug. The fall did not result in any significant injuries or complications. The patient's blood pressure has been fluctuating, with episodes of both high and low readings. She have been taking the prescribed medications consistently, including Losartan and bisoprolol for blood pressure management, and Leqvio for cholesterol management. Presently denies worsening dyspnea, cough, chest pain or palpitations.   Review of Systems  Cardiovascular:  Positive for dyspnea on exertion (stable). Negative for chest pain and leg swelling.    Risk Assessment/Calculations:    HYPERTENSION CONTROL Vitals:   07/29/23 1006 07/29/23 1010  BP: (!) 170/110 (!) 160/90    The patient's blood pressure is elevated above target today.  In order to address the patient's elevated BP: A  current anti-hypertensive medication was adjusted today.    Lab Results  Component Value Date   CHOL 172 06/29/2022   HDL 72 06/29/2022   LDLCALC 76 06/29/2022   TRIG 144 06/29/2022   Lab Results  Component Value Date   NA 131 (L) 10/07/2022   K 3.9 10/07/2022   CO2 23 10/07/2022   GLUCOSE 86 10/07/2022   BUN 19 10/07/2022   CREATININE 1.30 (H) 10/07/2022   CALCIUM 8.7 (L) 10/07/2022   EGFR 41 (L) 06/29/2022   GFRNONAA 42 (L) 10/07/2022   Lab Results  Component Value Date   WBC 5.2 10/07/2022   HGB 10.4 (L) 10/07/2022   HCT 34.0 (L) 10/07/2022   MCV 80.6 10/07/2022   PLT 326 10/07/2022    External Labs:  Labs 06/09/2023:  A1c 5.7%.  TSH normal at 3.320.  Labs 06/16/2023:  Total cholesterol 192, triglycerides 110, HDL 82, LDL 91.  Non-HDL cholesterol 110.  BUN 14, creatinine 1.270, EGFR 43 mL.  Potassium 3.9.  Physical Exam:   VS:  BP (!) 160/90 (BP Location: Right Arm, Patient Position: Sitting, Cuff Size: Normal) Comment: KH  Pulse 68   Resp 16   Ht 5\' 4"  (1.626 m)   Wt 148 lb (67.1 kg)   BMI 25.40 kg/m    Wt Readings from Last 3 Encounters:  07/29/23 148 lb (67.1 kg)  05/26/23 140 lb (63.5 kg)  04/30/23 142 lb (64.4 kg)     Physical Exam Neck:     Vascular: Carotid bruit (bilateral) present. No JVD.  Cardiovascular:     Rate and Rhythm: Normal rate and regular rhythm.  Pulses: Intact distal pulses.          Popliteal pulses are 1+ on the right side and 1+ on the left side.       Dorsalis pedis pulses are 2+ on the right side and 2+ on the left side.       Posterior tibial pulses are 1+ on the right side and 0 on the left side.     Heart sounds: Normal heart sounds. No murmur heard.    No gallop.  Pulmonary:     Effort: Pulmonary effort is normal.     Breath sounds: Rhonchi (Right base >>left chronic, half way up) present.  Abdominal:     General: Bowel sounds are normal.     Palpations: Abdomen is soft.  Musculoskeletal:     Right lower  leg: No edema.     Left lower leg: No edema.     Studies Reviewed: Marland Kitchen    EKG:    EKG Interpretation Date/Time:  Thursday July 29 2023 10:05:15 EDT Ventricular Rate:  77 PR Interval:  126 QRS Duration:  70 QT Interval:  382 QTC Calculation: 432 R Axis:   46  Text Interpretation: EKG 07/29/2023: Normal sinus rhythm at rate of 77 bpm, normal axis, no evidence of ischemia, no normal EKG.  No change from prior EKG 10/07/2022. Confirmed by [name] [phone]) on 07/29/2023 10:24:51 AM    Coronary Angiography MI in 1999; Stent in right coronary artery in 1999; MI in 2001; Patent stent at that time   Lower Extremity Arterial Duplex 12/23/2021: No hemodynamically significant stenosis are identified in the bilateral lower extremity arterial system. This exam reveals mildly decreased perfusion of the right and left lower extremity with right ABI 1.32 and left ABI 1.33 with mildly abnormal biphasic waveform at the ankles. Study suggests patent right SFA and left iliac stent.  There is diffuse calcific plaque throughout the bilateral lower extremity arterial tree.  No significant change compared to 03/26/2016.   Echocardiogram 08/10/2022: Left ventricle cavity is normal in size. Mild concentric hypertrophy of the left ventricle. Normal global wall motion. Normal LV systolic function with EF 59%. Doppler evidence of grade II (pseudonormal) diastolic dysfunction, elevated LAP. Left atrial cavity is moderately dilated. Moderate (Grade III) mitral regurgitation. Moderate tricuspid regurgitation. Estimated pulmonary artery systolic pressure 57 mmHg.  Carotid artery duplex 02/02/2023: Duplex suggests stenosis in the right internal carotid artery (1-15%). Duplex suggests stenosis in the left internal carotid artery (50-69%). Left carotid bulb stenosis of < 50%. Bilateral heterogeneous plaque noted. Antegrade right vertebral artery flow. Antegrade left vertebral artery flow. Overall compared  to serial studies dating back to 2021, stenosis severity appears to be stable in the bilateral carotid arteries.  Follow-up studies in 1 year in view of fairly stable carotid duplex over the past years.  ASSESSMENT AND PLAN: .      ICD-10-CM   1. Coronary artery disease involving native coronary artery of native heart without angina pectoris  I25.10 EKG 12-Lead    2. PAD (peripheral artery disease) (HCC)  I73.9     3. Asymptomatic bilateral carotid artery stenosis  I65.23 VAS US CAROTID    4. Primary hypertension  I10 losartan (COZAAR) 25 MG tablet    5. Statin myopathy  G72.0    T46.6X5A     6. Other secondary pulmonary hypertension (HCC)  I27.29 Ambulatory referral to Pulmonology      Assessment and Plan  1. Coronary artery disease involving native coronary artery of native  heart without angina pectoris Patient remains angina free, she has not had any stress testing in quite a while however no change in symptoms, EKG is completely normal, no change in physical examination as well.  Hence we will continue present management.  She is already on lipid-lowering therapy and a beta-blocker along with an ARB. - EKG 12-Lead  2. PAD (peripheral artery disease) (HCC) No further symptoms of claudication, no change in physical exam.  3. Asymptomatic bilateral carotid artery stenosis I reviewed the results of the carotid artery duplex that was performed in April of this year, she needs repeat carotid duplex next April L I will see her back at that time.  4. Primary hypertension Blood pressure has been very well-controlled over many years, today the blood pressure was markedly elevated.  I have increased the dose of losartan from 12.5 mg to 25 mg daily, patient is extremely sensitive to medications with marked decrease and drop in blood pressure even with minimal changes in the medication.  If blood pressure still remains high, we could consider addition of hydralazine or increasing the dose of  bisoprolol to maximum dose of 20 mg daily. - losartan (COZAAR) 25 MG tablet; Take 1 tablet (25 mg total) by mouth every evening. 1/2 tablet daily  Dispense: 90 tablet; Refill: 3  5. Statin myopathy Patient has statin myopathy, she could not tolerate any of the statins and also Zetia.  Presently non-HDL cholesterol is slightly elevated however not much options for now, continue present management with Leqvio.  6. Other secondary pulmonary hypertension (HCC) She does have moderate pulm hypertension by echocardiogram that was performed last year, repeat echocardiogram is pending.  She has bibasilar crackles on her lung examination, that has been chronic for the last 2 to 3 years and I am concerned about worsening crackles that I hear and concerned about ILD.  I will make a referral for pulmonary medicine to evaluate her.  Other orders - inclisiran (LEQVIO) 284 MG/1.5ML SOSY injection; Inject 284 mg into the skin once.   Signed,  Melissa Decamp, MD, [facility] 07/29/2023, 9:48 PM [facility] [address] [place], [place] [address] Phone: [phone]. Fax:  [phone]

## 2023-07-29 NOTE — Patient Instructions (Addendum)
Medication Instructions:  Your physician recommends that you continue on your current medications as directed. Please refer to the Current Medication list given to you today.  *If you need a refill on your cardiac medications before your next appointment, please call your pharmacy*  Lab Work: None ordered today.  Testing/Procedures: Your physician has requested that you have a carotid duplex in 6 months (1-2 weeks before follow-up appointment with Dr. Jacinto Halim). This test is an ultrasound of the carotid arteries in your neck. It looks at blood flow through these arteries that supply the brain with blood. Allow one hour for this exam. There are no restrictions or special instructions.   Follow-Up: At [facility], you and your health needs are our priority.  As part of our continuing mission to provide you with exceptional heart care, we have created designated Provider Care Teams.  These Care Teams include your primary Cardiologist (physician) and Advanced Practice Providers (APPs -  Physician Assistants and Nurse Practitioners) who all work together to provide you with the care you need, when you need it.  Your next appointment:   6 month(s)  The format for your next appointment:   In Person  Provider:   Yates Decamp, MD {  Other Instructions You have been referred to [facility]--a scheduler will call you to make an appointment to see one of their providers.

## 2023-08-02 ENCOUNTER — Other Ambulatory Visit: Payer: Self-pay

## 2023-08-02 DIAGNOSIS — I10 Essential (primary) hypertension: Secondary | ICD-10-CM

## 2023-08-02 MED ORDER — LOSARTAN POTASSIUM 25 MG PO TABS: 25.0000 mg | ORAL_TABLET | Freq: Every evening | ORAL | 3 refills | Status: DC

## 2023-08-05 ENCOUNTER — Other Ambulatory Visit: Payer: Medicare Other

## 2023-08-05 ENCOUNTER — Ambulatory Visit (HOSPITAL_COMMUNITY): Payer: Medicare Other | Attending: Cardiology

## 2023-08-05 DIAGNOSIS — I2510 Atherosclerotic heart disease of native coronary artery without angina pectoris: Secondary | ICD-10-CM | POA: Diagnosis not present

## 2023-08-05 DIAGNOSIS — I739 Peripheral vascular disease, unspecified: Secondary | ICD-10-CM | POA: Insufficient documentation

## 2023-08-05 DIAGNOSIS — Z006 Encounter for examination for normal comparison and control in clinical research program: Secondary | ICD-10-CM | POA: Diagnosis not present

## 2023-08-05 LAB — ECHOCARDIOGRAM COMPLETE
Area-P 1/2: 2.89 cm2
S' Lateral: 2.2 cm

## 2023-08-06 ENCOUNTER — Other Ambulatory Visit: Payer: Self-pay | Admitting: Family Medicine

## 2023-08-06 DIAGNOSIS — Z1231 Encounter for screening mammogram for malignant neoplasm of breast: Secondary | ICD-10-CM

## 2023-08-06 NOTE — Progress Notes (Signed)
Shortness of breath partly contributed by diastolic heart failure.   Diastolic function means the heart has to relax to receive the blood so it can pump the blood out. If relaxation is impaired, then the blood coming to the heart is restricted and can cause dyspnea and reduced functional capacity and fluid build up. Exercise, weight loss, control of BP, salt restriction will improve this.    Echocardiogram 08/05/2023:   1. Left ventricular ejection fraction, by estimation, is 60 to 65%. The left ventricle has normal function. The left ventricle has no regional wall motion abnormalities. Left ventricular diastolic parameters are consistent with Grade II diastolic dysfunction (pseudonormalization). The average left ventricular global longitudinal strain is -24.2 %. The global longitudinal strain is normal.  2. Right ventricular systolic function is normal. The right ventricular size is normal. There is mildly elevated pulmonary artery systolic pressure. The estimated right ventricular systolic pressure is 37.1 mmHg.  3. Left atrial size was moderately dilated.  4. The mitral valve is normal in structure. Trivial mitral valve regurgitation. No evidence of mitral stenosis.  5. The aortic valve is tricuspid. Aortic valve regurgitation is not visualized. Aortic valve sclerosis/calcification is present, without any evidence of aortic stenosis.  6. The inferior vena cava is normal in size with greater than 50% respiratory variability, suggesting right atrial pressure of 3 mmHg.

## 2023-08-08 ENCOUNTER — Other Ambulatory Visit: Payer: Self-pay | Admitting: Cardiology

## 2023-08-08 DIAGNOSIS — I10 Essential (primary) hypertension: Secondary | ICD-10-CM

## 2023-08-08 DIAGNOSIS — I2510 Atherosclerotic heart disease of native coronary artery without angina pectoris: Secondary | ICD-10-CM

## 2023-08-19 DIAGNOSIS — R5383 Other fatigue: Secondary | ICD-10-CM | POA: Diagnosis not present

## 2023-08-19 DIAGNOSIS — M5416 Radiculopathy, lumbar region: Secondary | ICD-10-CM | POA: Diagnosis not present

## 2023-08-19 DIAGNOSIS — M8589 Other specified disorders of bone density and structure, multiple sites: Secondary | ICD-10-CM | POA: Diagnosis not present

## 2023-08-19 DIAGNOSIS — Z8739 Personal history of other diseases of the musculoskeletal system and connective tissue: Secondary | ICD-10-CM | POA: Diagnosis not present

## 2023-08-19 DIAGNOSIS — N289 Disorder of kidney and ureter, unspecified: Secondary | ICD-10-CM | POA: Diagnosis not present

## 2023-08-19 DIAGNOSIS — M316 Other giant cell arteritis: Secondary | ICD-10-CM | POA: Diagnosis not present

## 2023-08-19 DIAGNOSIS — M810 Age-related osteoporosis without current pathological fracture: Secondary | ICD-10-CM | POA: Diagnosis not present

## 2023-08-19 DIAGNOSIS — M25559 Pain in unspecified hip: Secondary | ICD-10-CM | POA: Diagnosis not present

## 2023-08-19 DIAGNOSIS — M1990 Unspecified osteoarthritis, unspecified site: Secondary | ICD-10-CM | POA: Diagnosis not present

## 2023-08-19 DIAGNOSIS — I10 Essential (primary) hypertension: Secondary | ICD-10-CM | POA: Diagnosis not present

## 2023-08-19 DIAGNOSIS — M51369 Other intervertebral disc degeneration, lumbar region without mention of lumbar back pain or lower extremity pain: Secondary | ICD-10-CM | POA: Diagnosis not present

## 2023-09-16 ENCOUNTER — Ambulatory Visit
Admission: RE | Admit: 2023-09-16 | Discharge: 2023-09-16 | Disposition: A | Discharge status: Home / Self Care | Payer: Medicare Other | Source: Ambulatory Visit | Attending: Family Medicine | Admitting: Family Medicine

## 2023-09-16 DIAGNOSIS — Z1231 Encounter for screening mammogram for malignant neoplasm of breast: Secondary | ICD-10-CM | POA: Diagnosis not present

## 2023-09-23 DIAGNOSIS — M810 Age-related osteoporosis without current pathological fracture: Secondary | ICD-10-CM | POA: Diagnosis not present

## 2023-10-21 ENCOUNTER — Telehealth: Payer: Self-pay

## 2023-10-21 NOTE — Telephone Encounter (Signed)
  Auth Submission: NO AUTH NEEDED Site of care: Site of care: [facility] Payer: MEDICARE A/B & BCBS FED-RETIRE Medication & CPT/J Code(s) submitted: Leqvio  (Inclisiran) J1306 Route of submission (phone, fax, portal):  Phone # Fax # Auth type: Buy/Bill Units/visits requested: 284mg  x 2 doses Reference number:  Approval from: 01/20/23 to 11/11/24    Medicare = no auth needed [employer] retirement = no auth needed   Medicare will cover 80% and BCBS will pick-up remaining 20%

## 2023-11-01 ENCOUNTER — Ambulatory Visit: Payer: Medicare Other

## 2023-11-01 VITALS — BP 166/75 | HR 67 | Temp 97.5°F | Resp 18 | Ht 63.0 in | Wt 146.0 lb

## 2023-11-01 DIAGNOSIS — E7800 Pure hypercholesterolemia, unspecified: Secondary | ICD-10-CM | POA: Diagnosis not present

## 2023-11-01 MED ORDER — INCLISIRAN SODIUM 284 MG/1.5ML ~~LOC~~ SOSY
284.0000 mg | PREFILLED_SYRINGE | Freq: Once | SUBCUTANEOUS | Status: AC
Start: 2023-11-01 — End: 2023-11-01
  Administered 2023-11-01: 284 mg via SUBCUTANEOUS
  Filled 2023-11-01: qty 1.5

## 2023-11-01 NOTE — Progress Notes (Signed)
Diagnosis: Hypercholesterolemia   Provider:  Chilton Greathouse MD  Procedure: Injection  Leqvio (inclisiran), Dose: 284 mg, Site: subcutaneous, Number of injections: 1  Injection Site(s): Left lower quad. abdomen  Post Care: Patient declined observation  Discharge: Condition: Good, Destination: Home . AVS Provided  Performed by:  Wyvonne Lenz, RN

## 2023-11-16 ENCOUNTER — Ambulatory Visit: Payer: Medicare Other | Admitting: Pulmonary Disease

## 2023-11-16 ENCOUNTER — Encounter: Payer: Self-pay | Admitting: Pulmonary Disease

## 2023-11-16 VITALS — BP 103/66 | HR 81 | Ht 63.0 in | Wt 147.2 lb

## 2023-11-16 DIAGNOSIS — R0609 Other forms of dyspnea: Secondary | ICD-10-CM

## 2023-11-16 DIAGNOSIS — R0989 Other specified symptoms and signs involving the circulatory and respiratory systems: Secondary | ICD-10-CM | POA: Diagnosis not present

## 2023-11-16 NOTE — Patient Instructions (Signed)
 Nice to meet you  For further evaluation of your symptoms I recommend pulmonary function test  I recommend a high-resolution CT scan of the chest as well given the crackles we hear in your lung  Return to clinic in 2 months for follow-up with Dr. Annella after CT scan and PFTs

## 2023-11-16 NOTE — Progress Notes (Signed)
 @Patient  ID: [name], female    DOB: 18-Jul-1941, 83 y.o.   MRN: [id number]  Chief Complaint  Patient presents with  . Consult    Pt states she gets SOB easily,cough , weezing    Referring provider: Ladona Heinz, MD  83 y.o. woman with significant coronary disease and cardiac disease whom we are seeing for evaluation of dyspnea on exertion, crackles on lung exam.  Patient notes longstanding dyspnea on exertion.  Okay at rest but when moving around short of breath.  Cannot ride bike anymore.  Up and down stairs carrying things etc.  She notes that she has significant coronary disease has had stents in the past.  Stated that her cardiologist has noticed that she has abnormal lung sounds and this prompted referral.  In terms of dyspnea, note, day when things are better or worse.  No position to make things better or worse.  No seasonal or environmental factors she can identify to make things better or worse.  No alleviating or exacerbating factors.  She never diagnosed with asthma.  Has been told she has COPD has never had pulmonary function test.  Has not used inhalers at [facility].  Reviewed most recent chest imaging chest x-ray 09/2022 on my review interpretation reveals clear lungs bilaterally.  Her most recent echocardiogram in 2024 reveals diastolic function, left atrial dilation, trivial MVR, concern for elevated RVSP or pulmonary hypertension, mildly elevated 37.1 mmHg, normal right atrial size, normal right ventricular size and function.  Questionaires / Pulmonary Flowsheets:   ACT:      No data to display          MMRC:     No data to display          Epworth:      No data to display          Tests:   FENO:  No results found for: NITRICOXIDE  PFT:     No data to display          WALK:      No data to display          Imaging: Personally reviewed and as per EMR and discussion in this note No results found.  Lab Results: Personally  reviewed CBC    Component Value Date/Time   WBC 5.2 10/07/2022 1621   RBC 4.22 10/07/2022 1621   HGB 10.4 (L) 10/07/2022 1621   HCT 34.0 (L) 10/07/2022 1621   PLT 326 10/07/2022 1621   MCV 80.6 10/07/2022 1621   MCH 24.6 (L) 10/07/2022 1621   MCHC 30.6 10/07/2022 1621   RDW 15.2 10/07/2022 1621   LYMPHSABS 0.5 (L) 10/07/2022 1621   MONOABS 0.6 10/07/2022 1621   EOSABS 0.0 10/07/2022 1621   BASOSABS 0.0 10/07/2022 1621    BMET    Component Value Date/Time   NA 131 (L) 10/07/2022 1621   NA 137 06/29/2022 0941   K 3.9 10/07/2022 1621   CL 98 10/07/2022 1621   CO2 23 10/07/2022 1621   GLUCOSE 86 10/07/2022 1621   BUN 19 10/07/2022 1621   BUN 16 06/29/2022 0941   CREATININE 1.30 (H) 10/07/2022 1621   CALCIUM 8.7 (L) 10/07/2022 1621   GFRNONAA 42 (L) 10/07/2022 1621   GFRAA  05/16/2008 0600    >60        The eGFR has been calculated using the MDRD equation. This calculation has not been validated in all clinical    BNP No results found  for: BNP  ProBNP No results found for: PROBNP  Specialty Problems   None   Allergies  Allergen Reactions  . Bactrim [Sulfamethoxazole-Trimethoprim] Anaphylaxis  . Sulfa Antibiotics Anaphylaxis  . Crestor [Rosuvastatin] Cough    deathly ill  . Ibuprofen Nausea And Vomiting  . Ivp Dye [Iodinated Contrast Media] Nausea And Vomiting and Other (See Comments)    I may have had a rash too They normally give me benadryl  and steroids before contrast dye  . Lipitor [Atorvastatin] Other (See Comments)    Legs hurt so bad she couldn't walk  . Macrodantin Nausea And Vomiting    faint  . Naprosyn [Naproxen] Nausea Only and Other (See Comments)    faint  . Niaspan [Niacin Er (Antihyperlipidemic)] Other (See Comments)    Flushing, headache  . Vioxx [Rofecoxib] Other (See Comments)    deathly ill  . Zetia  [Ezetimibe ] Other (See Comments)    Deathly ill   . Bacitracin     Other reaction(s): Unknown  . Colesevelam Hcl      Other reaction(s): Unknown  . Fluvastatin Sodium     Other reaction(s): Unknown  . Meloxicam     Can't remember  . Tramadol  Hcl Nausea Only  . Ciprofloxacin Rash  . Clarithromycin Rash  . Neosporin [Neomycin-Bacitracin Zn-Polymyx] Rash  . Phenergan [Promethazine Hcl] Rash  . Pletal [Cilostazol] Other (See Comments)    headache  . Ramipril Cough    Immunization History  Administered Date(s) Administered  . Influenza-Unspecified 07/13/2015, 10/12/2018    Past Medical History:  Diagnosis Date  . Anemia   . Chronic bronchitis (HCC)   . Coronary artery disease   . Difficulty swallowing pills   . Difficulty swallowing solids   . GERD (gastroesophageal reflux disease)   . H/O temporal arteritis 08/2014-01/2015  . HA (headache)    related to temporal arteritis 08/2014-01/2015  . Hypercholesteremia   . Hypertension   . Hypoxia   . Kidney stones   . Left carotid artery stenosis   . Myocardial infarct (HCC) 1999; 2001  . On home oxygen  therapy    2L at night; don't always use it (11/12/2015)  . Pneumonia 1970s X 1; ~ 2006; ~ 2014  . PVD (peripheral vascular disease) (HCC)   . Rheumatoid arthritis (HCC)   . Syncope and collapse 10/05/2015    Tobacco History: Social History   Tobacco Use  Smoking Status Former  . Current packs/day: 0.00  . Average packs/day: 2.0 packs/day for 42.0 years (84.0 ttl pk-yrs)  . Types: Cigarettes  . Start date: 65  . Quit date: 2001  . Years since quitting: 24.1  Smokeless Tobacco Never  Tobacco Comments   Quit in 2000   Counseling given: Not Answered Tobacco comments: Quit in 2000   Continue to not smoke  Outpatient Encounter Medications as of 11/16/2023  Medication Sig  . aspirin  81 MG tablet Take 81 mg by mouth daily.  . bisoprolol  (ZEBETA ) 10 MG tablet Take 1 tablet by mouth once daily  . cetirizine (ZYRTEC) 10 MG tablet Take 10 mg by mouth daily as needed for allergies.  . Cholecalciferol  (VITAMIN D ) 2000 UNITS CAPS Take  2,000 Units by mouth daily.  . Coenzyme Q10 (COQ10) 200 MG CAPS Take 1 capsule by mouth daily.  . Cyanocobalamin (VITAMIN B12) 1000 MCG TBCR Take 1 tablet by mouth daily.  . inclisiran (LEQVIO ) 284 MG/1.5ML SOSY injection Inject 284 mg into the skin once.  . losartan  (COZAAR ) 25 MG tablet Take 1 tablet (  25 mg total) by mouth every evening.  . Omega-3 Fatty Acids (FISH OIL) 1000 MG CAPS Take 1 capsule by mouth once a week.  . pantoprazole (PROTONIX) 40 MG tablet Take 40 mg by mouth daily.  . predniSONE (DELTASONE) 5 MG tablet Take 2.5 mg by mouth daily.  . Probiotic Product (PROBIOTIC DAILY PO) Take 1 capsule by mouth daily.  . Pyridoxine HCl (VITAMIN B6) 250 MG TABS Take 1 tablet by mouth daily.   No facility-administered encounter medications on file as of 11/16/2023.     Review of Systems  Review of Systems  No chest pain with exertion.  No orthopnea or PND.  Comprehensive review of systems otherwise negative. Physical Exam  BP 103/66 (BP Location: Left Arm, Patient Position: Sitting, Cuff Size: Normal)   Pulse 81   Ht 5' 3 (1.6 m)   Wt 147 lb 3.2 oz (66.8 kg)   SpO2 94%   BMI 26.08 kg/m   Wt Readings from Last 5 Encounters:  11/16/23 147 lb 3.2 oz (66.8 kg)  11/01/23 146 lb (66.2 kg)  07/29/23 148 lb (67.1 kg)  05/26/23 140 lb (63.5 kg)  04/30/23 142 lb (64.4 kg)    BMI Readings from Last 5 Encounters:  11/16/23 26.08 kg/m  11/01/23 25.86 kg/m  07/29/23 25.40 kg/m  05/26/23 24.03 kg/m  04/30/23 24.37 kg/m     Physical Exam General: Sitting in chair, no acute distress Eyes: EOMI, no icterus Neck: Supple, no JVP Pulmonary: Mild bibasilar crackles with inspiration, otherwise clear, normal work of breathing Cardiovascular: Warm, no edema Abdomen: Nondistended, bowel sounds present MSK: No synovitis, no joint effusion Neuro: Normal gait, no weakness Psych: Normal mood, full affect   Assessment & Plan:   Dyspnea on exertion: Suspect multifactorial.   Certainly there cardiac etiologies given her CAD, diastolic dysfunction, dilated left atrium, likely pulmonary hypertension large contributor WHO group 2.  She is has a 80-pack-year smoking history.  Development of emphysema or COPD also quite possible.  Bibasilar crackles mild.  Possible ILD.  PFTs for further evaluation.  Bibasilar crackles: Mild.  Associated dyspnea on exertion.  CT high-resolution of the chest to evaluate for possible ILD ordered today.   Return in about 2 months (around 01/14/2024) for f/u Dr. Annella, after PFT, after CT scan.   Donnice JONELLE Annella, MD 11/16/2023   This appointment required 60 minutes of patient care (this includes precharting, chart review, review of results, face-to-face care, etc.).

## 2023-12-09 ENCOUNTER — Ambulatory Visit (HOSPITAL_COMMUNITY)
Admission: RE | Admit: 2023-12-09 | Discharge: 2023-12-09 | Disposition: A | Discharge status: Home / Self Care | Payer: Medicare Other | Source: Ambulatory Visit | Attending: Family Medicine | Admitting: Family Medicine

## 2023-12-09 DIAGNOSIS — R0989 Other specified symptoms and signs involving the circulatory and respiratory systems: Secondary | ICD-10-CM | POA: Insufficient documentation

## 2023-12-09 DIAGNOSIS — R0602 Shortness of breath: Secondary | ICD-10-CM | POA: Diagnosis not present

## 2023-12-09 DIAGNOSIS — R0609 Other forms of dyspnea: Secondary | ICD-10-CM | POA: Insufficient documentation

## 2023-12-09 DIAGNOSIS — J432 Centrilobular emphysema: Secondary | ICD-10-CM | POA: Diagnosis not present

## 2023-12-14 DIAGNOSIS — I129 Hypertensive chronic kidney disease with stage 1 through stage 4 chronic kidney disease, or unspecified chronic kidney disease: Secondary | ICD-10-CM | POA: Diagnosis not present

## 2023-12-14 DIAGNOSIS — N1832 Chronic kidney disease, stage 3b: Secondary | ICD-10-CM | POA: Diagnosis not present

## 2023-12-14 DIAGNOSIS — D649 Anemia, unspecified: Secondary | ICD-10-CM | POA: Diagnosis not present

## 2023-12-14 DIAGNOSIS — R946 Abnormal results of thyroid function studies: Secondary | ICD-10-CM | POA: Diagnosis not present

## 2023-12-14 DIAGNOSIS — E7800 Pure hypercholesterolemia, unspecified: Secondary | ICD-10-CM | POA: Diagnosis not present

## 2023-12-14 DIAGNOSIS — R7303 Prediabetes: Secondary | ICD-10-CM | POA: Diagnosis not present

## 2023-12-14 LAB — LAB REPORT - SCANNED
A1c: 5.9
EGFR (Non-African Amer.): 38

## 2023-12-21 DIAGNOSIS — I129 Hypertensive chronic kidney disease with stage 1 through stage 4 chronic kidney disease, or unspecified chronic kidney disease: Secondary | ICD-10-CM | POA: Diagnosis not present

## 2023-12-21 DIAGNOSIS — I6529 Occlusion and stenosis of unspecified carotid artery: Secondary | ICD-10-CM | POA: Diagnosis not present

## 2023-12-21 DIAGNOSIS — R7989 Other specified abnormal findings of blood chemistry: Secondary | ICD-10-CM | POA: Diagnosis not present

## 2023-12-21 DIAGNOSIS — Z87891 Personal history of nicotine dependence: Secondary | ICD-10-CM | POA: Diagnosis not present

## 2023-12-21 DIAGNOSIS — Z79899 Other long term (current) drug therapy: Secondary | ICD-10-CM | POA: Diagnosis not present

## 2023-12-21 DIAGNOSIS — Z95828 Presence of other vascular implants and grafts: Secondary | ICD-10-CM | POA: Diagnosis not present

## 2023-12-21 DIAGNOSIS — I739 Peripheral vascular disease, unspecified: Secondary | ICD-10-CM | POA: Diagnosis not present

## 2023-12-21 DIAGNOSIS — I2510 Atherosclerotic heart disease of native coronary artery without angina pectoris: Secondary | ICD-10-CM | POA: Diagnosis not present

## 2023-12-21 DIAGNOSIS — N1832 Chronic kidney disease, stage 3b: Secondary | ICD-10-CM | POA: Diagnosis not present

## 2023-12-21 DIAGNOSIS — R7303 Prediabetes: Secondary | ICD-10-CM | POA: Diagnosis not present

## 2024-01-12 ENCOUNTER — Ambulatory Visit (HOSPITAL_COMMUNITY)
Admission: RE | Admit: 2024-01-12 | Discharge: 2024-01-12 | Disposition: A | Discharge status: Home / Self Care | Payer: Medicare Other | Source: Ambulatory Visit | Attending: Cardiology | Admitting: Cardiology

## 2024-01-12 ENCOUNTER — Encounter: Payer: Self-pay | Admitting: Cardiology

## 2024-01-12 DIAGNOSIS — I6523 Occlusion and stenosis of bilateral carotid arteries: Secondary | ICD-10-CM | POA: Insufficient documentation

## 2024-01-12 NOTE — Progress Notes (Signed)
 Mild carotid disease with bilateral 1-39% stenosis. No further evaluation needed

## 2024-01-20 ENCOUNTER — Ambulatory Visit: Payer: Medicare Other | Admitting: Pulmonary Disease

## 2024-01-20 ENCOUNTER — Encounter: Payer: Self-pay | Admitting: Pulmonary Disease

## 2024-01-20 VITALS — BP 138/82 | HR 69 | Ht 63.0 in | Wt 149.0 lb

## 2024-01-20 DIAGNOSIS — J432 Centrilobular emphysema: Secondary | ICD-10-CM

## 2024-01-20 DIAGNOSIS — R0609 Other forms of dyspnea: Secondary | ICD-10-CM

## 2024-01-20 NOTE — Patient Instructions (Addendum)
 We will work to get the pulmonary function tests rescheduled at [facility] sooner than the June date you are provided  After that we will get the results to you either myself or nurse will call with recommendations on inhaler to use.  Will follow-up afterwards to see if the inhaler is helping or not and discuss next steps  Return to clinic in 3 months with Dr. Judeth Horn  Please schedule pulmonary function test next available at [facility] assuming this will be done sooner than the June date she was provided when pulmonary function test today were scheduled but then canceled

## 2024-01-20 NOTE — Progress Notes (Signed)
 @Patient  ID: [name], female    DOB: 11-09-1940, 83 y.o.   MRN: [id number]  Chief Complaint  Patient presents with   Follow-up    Referring provider: Irena Reichmann, DO  83 y.o. woman with significant coronary disease and cardiac disease whom we are seeing for evaluation of dyspnea on exertion, crackles on lung exam.  There is a cardiology note reviewed.  Returns for routine follow-up.  CT scan high-resolution in interim shows no ILD.  On my read interpretation lungs are clear but does demonstrate mild bibasilar bronchiectasis.  We discussed this could be reason for shortness of breath and the mild obstruction.  Inhalers sometimes help with this.  Pulmonary function test rescheduled today but they were canceled and rescheduled for 2 months from now.  I think this is due to lack of the technician to perform the test.  Unfortunately it was canceled last minute as this information is needed and can be quite helpful.  Her lung exam is totally clear today no wheezes or crackles.  HPI initial visit: Patient notes longstanding dyspnea on exertion.  Okay at rest but when moving around short of breath.  Cannot ride bike anymore.  Up and down stairs carrying things etc.  She notes that she has significant coronary disease has had stents in the past.  Stated that her cardiologist has noticed that she has abnormal lung sounds and this prompted referral.  In terms of dyspnea, note, day when things are better or worse.  No position to make things better or worse.  No seasonal or environmental factors she can identify to make things better or worse.  No alleviating or exacerbating factors.  She never diagnosed with asthma.  Has been told she has "COPD" has never had pulmonary function test.  Has not used inhalers at [facility].  Reviewed most recent chest imaging chest x-ray 09/2022 on my review interpretation reveals clear lungs bilaterally.  Her most recent echocardiogram in 2024 reveals diastolic  function, left atrial dilation, trivial MVR, concern for elevated RVSP or pulmonary hypertension, mildly elevated 37.1 mmHg, normal right atrial size, normal right ventricular size and function.  Questionaires / Pulmonary Flowsheets:   ACT:      No data to display          MMRC:     No data to display          Epworth:      No data to display          Tests:   FENO:  No results found for: "NITRICOXIDE"  PFT:     No data to display          WALK:      No data to display          Imaging: Personally reviewed and as per EMR and discussion in this note VAS US CAROTID Result Date: 01/12/2024 Carotid Arterial Duplex Study Patient Name:  [name]  Date of Exam:   01/12/2024 Medical Rec #: [id number]       Accession #:    [id number] Date of Birth: [birth year]      Patient Gender: F Patient Age:   62 years Exam Location:  [facility] Procedure:      VAS US CAROTID Referring Phys: Yates Decamp --------------------------------------------------------------------------------  Indications:       Bilateral bruits, Carotid artery disease and History of left  CEA and left subclavian arterial stent. She denies any                    cerebrovascular symptoms and right arm pain with exertion. Risk Factors:      Hyperlipidemia, past history of smoking, prior MI, coronary                    artery disease, PAD. Other Factors:     Left carotid endarterectomy 2000                     Left subclavian arterial stent placed , unsure of date                     History of CKD stage 3a. Comparison Study:  Prior carotid duplex exam on 02/02/2023 (outside) showed right                    ICA 1-15%, left ICA 50-69% Performing Technologist: Carlos American RVT, RDCS (AE), RDMS  Examination Guidelines: A complete evaluation includes B-mode imaging, spectral Doppler, color Doppler, and power Doppler as needed of all accessible portions of each vessel. Bilateral testing is considered an  integral part of a complete examination. Limited examinations for reoccurring indications may be performed as noted.  Right Carotid Findings: +----------+-------+--------+--------+-----------------------+-----------------+           PSV    EDV cm/sStenosisPlaque Description     Comments                    cm/s                                                            +----------+-------+--------+--------+-----------------------+-----------------+ CCA Prox  56     9                                                        +----------+-------+--------+--------+-----------------------+-----------------+ CCA Mid                                                 intimal                                                                   thickening        +----------+-------+--------+--------+-----------------------+-----------------+ CCA Distal62     11              heterogenous and       intimal                                            calcific  thickening        +----------+-------+--------+--------+-----------------------+-----------------+ ICA Prox  50     12              calcific and           Shadowing                                          heterogenous                             +----------+-------+--------+--------+-----------------------+-----------------+ ICA Mid   67     18      1-39%                                            +----------+-------+--------+--------+-----------------------+-----------------+ ICA Distal65     16                                     tortuous          +----------+-------+--------+--------+-----------------------+-----------------+ ECA       54     0                                      intimal                                                                   thickening        +----------+-------+--------+--------+-----------------------+-----------------+  +----------+--------+-------+----------------------------+-------------------+           PSV cm/sEDV cmsDescribe                    Arm Pressure (mmHG) +----------+--------+-------+----------------------------+-------------------+ Subclavian240     0      dampened with turbulent flow148                 +----------+--------+-------+----------------------------+-------------------+ +---------+--------+--+--------+-+-----------------------+ VertebralPSV cm/s24EDV cm/s8Dampened antegrade flow +---------+--------+--+--------+-+-----------------------+ Right antecubital fossa, 40 cm/s, biphasic flow. Slight decrease in upstroke. Left Carotid Findings: +----------+--------+--------+--------+---------------------+------------------+           PSV cm/sEDV cm/sStenosisPlaque Description   Comments           +----------+--------+--------+--------+---------------------+------------------+ CCA Prox  43      9               heterogenous and     intimal thickening                                   calcific                                +----------+--------+--------+--------+---------------------+------------------+ CCA Distal94      18      <50%    diffuse,  heterogenous,                                                             irregular and                                                             calcific                                +----------+--------+--------+--------+---------------------+------------------+ ICA Prox  73      22                                   S/P CEA            +----------+--------+--------+--------+---------------------+------------------+ ICA Mid   90      17      1-39%                        S/P CEA            +----------+--------+--------+--------+---------------------+------------------+ ICA Distal44      12                                                       +----------+--------+--------+--------+---------------------+------------------+ ECA       59      6                                    intimal thickening +----------+--------+--------+--------+---------------------+------------------+ +----------+--------+--------+----------------+-------------------+           PSV cm/sEDV cm/sDescribe        Arm Pressure (mmHG) +----------+--------+--------+----------------+-------------------+ Subclavian105     0       Multiphasic, [id number]                 +----------+--------+--------+----------------+-------------------+ +---------+--------+---+--------+--+---------+ VertebralPSV cm/s138EDV cm/s25Antegrade +---------+--------+---+--------+--+---------+ Left antecubital fossa, 40 cm/s, biphasic flow. Brisk upstroke. Subclavian arterial stent patent with no evidence of stenosis seen, proximal 113 cm/s, mid 55 cm/s, distal 57 cm/s. Left Stent(s): +----------+--------+--------+--------+--------+--------+ subclavianPSV cm/sEDV cm/sStenosisWaveformComments +----------+--------+--------+--------+--------+--------+ Stent struts not seen. Refer to native carotid worksheet.   Summary: Right Carotid: Velocities in the right ICA are consistent with a 1-39% stenosis. Left Carotid: Velocities in the left ICA are consistent with a 1-39% stenosis.               Non-hemodynamically significant plaque <50% noted in the CCA. Vertebrals:  Left vertebral artery demonstrates antegrade flow. Subclavians: Right subclavian artery was stenotic with dampened flow. Normal              flow hemodynamics were seen in the left subclavian artery with no              evidence of stent stenosis, stent struts  not seen.               Brachial blood pressure shows a 25 mmHg difference left > right.              Abnormal subclavian and vertebral arterial waveforms seen. Must              consider early subclavian steal syndrome. *See table(s) above for measurements  and observations. Suggest follow up study in 12 months. Electronically signed by Nanetta Batty MD on 01/12/2024 at 1:09:43 PM.    Final     Lab Results: Personally reviewed CBC    Component Value Date/Time   WBC 5.2 10/07/2022 1621   RBC 4.22 10/07/2022 1621   HGB 10.4 (L) 10/07/2022 1621   HCT 34.0 (L) 10/07/2022 1621   PLT 326 10/07/2022 1621   MCV 80.6 10/07/2022 1621   MCH 24.6 (L) 10/07/2022 1621   MCHC 30.6 10/07/2022 1621   RDW 15.2 10/07/2022 1621   LYMPHSABS 0.5 (L) 10/07/2022 1621   MONOABS 0.6 10/07/2022 1621   EOSABS 0.0 10/07/2022 1621   BASOSABS 0.0 10/07/2022 1621    BMET    Component Value Date/Time   NA 131 (L) 10/07/2022 1621   NA 137 06/29/2022 0941   K 3.9 10/07/2022 1621   CL 98 10/07/2022 1621   CO2 23 10/07/2022 1621   GLUCOSE 86 10/07/2022 1621   BUN 19 10/07/2022 1621   BUN 16 06/29/2022 0941   CREATININE 1.30 (H) 10/07/2022 1621   CALCIUM 8.7 (L) 10/07/2022 1621   GFRNONAA 42 (L) 10/07/2022 1621   GFRAA  05/16/2008 0600    >60        The eGFR has been calculated using the MDRD equation. This calculation has not been validated in all clinical    BNP No results found for: "BNP"  ProBNP No results found for: "PROBNP"  Specialty Problems   None   Allergies  Allergen Reactions   Bactrim [Sulfamethoxazole-Trimethoprim] Anaphylaxis   Sulfa Antibiotics Anaphylaxis   Crestor [Rosuvastatin] Cough    "deathly ill"   Ibuprofen Nausea And Vomiting   Ivp Dye [Iodinated Contrast Media] Nausea And Vomiting and Other (See Comments)    "I may have had a rash too" "They normally give me benadryl and steroids before contrast dye"   Lipitor [Atorvastatin] Other (See Comments)    Legs hurt so bad she couldn't walk   Macrodantin Nausea And Vomiting    faint   Naprosyn [Naproxen] Nausea Only and Other (See Comments)    faint   Niaspan [Niacin Er (Antihyperlipidemic)] Other (See Comments)    Flushing, headache   Vioxx [Rofecoxib] Other (See  Comments)    "deathly ill"   Zetia [Ezetimibe] Other (See Comments)    Deathly ill    Bacitracin     Other reaction(s): Unknown   Colesevelam Hcl     Other reaction(s): Unknown   Fluvastatin Sodium     Other reaction(s): Unknown   Meloxicam     Can't remember   Tramadol Hcl Nausea Only   Ciprofloxacin Rash   Clarithromycin Rash   Neosporin [Neomycin-Bacitracin Zn-Polymyx] Rash   Phenergan [Promethazine Hcl] Rash   Pletal [Cilostazol] Other (See Comments)    headache   Ramipril Cough    Immunization History  Administered Date(s) Administered   Influenza-Unspecified 07/13/2015, 10/12/2018    Past Medical History:  Diagnosis Date   Anemia    Chronic bronchitis (HCC)    Coronary artery disease  Difficulty swallowing pills    Difficulty swallowing solids    GERD (gastroesophageal reflux disease)    H/O temporal arteritis 08/2014-01/2015   HA (headache)    "related to temporal arteritis 08/2014-01/2015"   Hypercholesteremia    Hypertension    Hypoxia    Kidney stones    Left carotid artery stenosis    Myocardial infarct (HCC) 1999; 2001   On home oxygen therapy    "2L at night; don't always use it" (11/12/2015)   Pneumonia 1970s X 1; ~ 2006; ~ 2014   PVD (peripheral vascular disease) (HCC)    Rheumatoid arthritis (HCC)    Syncope and collapse 10/05/2015    Tobacco History: Social History   Tobacco Use  Smoking Status Former   Current packs/day: 0.00   Average packs/day: 2.0 packs/day for 42.0 years (84.0 ttl pk-yrs)   Types: Cigarettes   Start date: 49   Quit date: 2001   Years since quitting: 24.2  Smokeless Tobacco Never  Tobacco Comments   Quit in 2000   Counseling given: Not Answered Tobacco comments: Quit in 2000   Continue to not smoke  Outpatient Encounter Medications as of 01/20/2024  Medication Sig   aspirin 81 MG tablet Take 81 mg by mouth daily.   bisoprolol (ZEBETA) 10 MG tablet Take 1 tablet by mouth once daily   cetirizine (ZYRTEC)  10 MG tablet Take 10 mg by mouth daily as needed for allergies.   Cholecalciferol (VITAMIN D) 2000 UNITS CAPS Take 2,000 Units by mouth daily.   Coenzyme Q10 (COQ10) 200 MG CAPS Take 1 capsule by mouth daily.   Cyanocobalamin (VITAMIN B12) 1000 MCG TBCR Take 1 tablet by mouth daily.   inclisiran (LEQVIO) 284 MG/1.5ML SOSY injection Inject 284 mg into the skin once.   losartan (COZAAR) 25 MG tablet Take 1 tablet (25 mg total) by mouth every evening.   Omega-3 Fatty Acids (FISH OIL) 1000 MG CAPS Take 1 capsule by mouth once a week.   pantoprazole (PROTONIX) 40 MG tablet Take 40 mg by mouth daily.   predniSONE (DELTASONE) 5 MG tablet Take 2.5 mg by mouth daily.   Probiotic Product (PROBIOTIC DAILY PO) Take 1 capsule by mouth daily.   Pyridoxine HCl (VITAMIN B6) 250 MG TABS Take 1 tablet by mouth daily.   No facility-administered encounter medications on file as of 01/20/2024.     Review of Systems  Review of Systems  N/a Physical Exam  BP 138/82 (BP Location: Left Arm, Patient Position: Sitting, Cuff Size: Normal)   Pulse 69   Ht 5\' 3"  (1.6 m)   Wt 149 lb (67.6 kg)   SpO2 95%   BMI 26.39 kg/m   Wt Readings from Last 5 Encounters:  01/20/24 149 lb (67.6 kg)  11/16/23 147 lb 3.2 oz (66.8 kg)  11/01/23 146 lb (66.2 kg)  07/29/23 148 lb (67.1 kg)  05/26/23 140 lb (63.5 kg)    BMI Readings from Last 5 Encounters:  01/20/24 26.39 kg/m  11/16/23 26.08 kg/m  11/01/23 25.86 kg/m  07/29/23 25.40 kg/m  05/26/23 24.03 kg/m     Physical Exam General: Sitting in chair, no acute distress Eyes: EOMI, no icterus Neck: Supple, no JVP Pulmonary:  clear, normal work of breathing Cardiovascular: Warm, no edema Abdomen: Nondistended, bowel sounds present MSK: No synovitis, no joint effusion Neuro: Normal gait, no weakness Psych: Normal mood, full affect   Assessment & Plan:   Dyspnea on exertion: Suspect multifactorial.  Certainly there cardiac etiologies given  her CAD,  diastolic dysfunction, dilated left atrium, likely pulmonary hypertension large contributor WHO group 2.  She is has a 80-pack-year smoking history.  Emphysema seen on CT scan.  No ILD.  PFTs were canceled last minute so we do not have this information which is unfortunate.  Think she would benefit from dual bronchodilators in the setting emphysema and mild bronchiectasis seen on CT scan.  She would prefer to have PFTs before starting new inhalers.  These need to be rescheduled  Bibasilar crackles: Mild on initial exam.  Not heard today.  Small area of atelectasis likely culprit on CT scan.  No further follow-up or recommendations.  Return in about 3 months (around 04/20/2024) for f/u Dr. Judeth Horn, after PFT.   Karren Burly, MD 01/20/2024

## 2024-01-31 ENCOUNTER — Encounter: Payer: Self-pay | Admitting: Cardiology

## 2024-01-31 ENCOUNTER — Ambulatory Visit: Payer: Medicare Other | Attending: Cardiology | Admitting: Cardiology

## 2024-01-31 VITALS — BP 170/98 | HR 68 | Ht 64.0 in | Wt 147.2 lb

## 2024-01-31 DIAGNOSIS — I6523 Occlusion and stenosis of bilateral carotid arteries: Secondary | ICD-10-CM | POA: Diagnosis not present

## 2024-01-31 DIAGNOSIS — T466X5D Adverse effect of antihyperlipidemic and antiarteriosclerotic drugs, subsequent encounter: Secondary | ICD-10-CM

## 2024-01-31 DIAGNOSIS — I2510 Atherosclerotic heart disease of native coronary artery without angina pectoris: Secondary | ICD-10-CM | POA: Diagnosis not present

## 2024-01-31 DIAGNOSIS — I10 Essential (primary) hypertension: Secondary | ICD-10-CM | POA: Insufficient documentation

## 2024-01-31 DIAGNOSIS — G720 Drug-induced myopathy: Secondary | ICD-10-CM | POA: Diagnosis not present

## 2024-01-31 DIAGNOSIS — T466X5A Adverse effect of antihyperlipidemic and antiarteriosclerotic drugs, initial encounter: Secondary | ICD-10-CM | POA: Diagnosis not present

## 2024-01-31 MED ORDER — AMLODIPINE BESYLATE 5 MG PO TABS: 5.0000 mg | ORAL_TABLET | Freq: Every day | ORAL | 2 refills | Status: DC

## 2024-01-31 NOTE — Progress Notes (Signed)
 Cardiology Office Note:  .   Date:  01/31/2024  ID:  [name], DOB [birth year], MRN [id number] PCP: Pete Brand, DO   HeartCare Providers Cardiologist:  Knox Perl, MD   History of Present Illness: [name] is a 83 y.o. Caucasian female with coronary artery disease and myocardial infarction in 1999 &  2001, left carotid endarterectomy in 2000, left iliac artery stenting and right SFA stenting and left subclavian artery stenting in the remote past and repeat stenting of bilateral external iliac arteries on  11/12/2015 with balloon expandable stent on the right and self-expanding stent on the left.    She is now on Leqvilo and tolerating this well. Past medical history significant for hypertension, hyperlipidemia, stage IIIa-B chronic kidney disease.  On her last office visit 6 months ago I referred her to pulmonary evaluation in view of marked bibasilar crackles heard on physical examination.  She was found to have bronchiectasis but no pulmonary fibrosis.  She now presents for follow-up.  Remains asymptomatic.   Labs   External Labs:  Care Everywhere labs 12/14/2023:  Hb 14.1/HCT 44.7, platelets 424.  Serum glucose 89 mg, BUN 16, creatinine 1.39, EGFR 38 mL, LFTs normal, potassium 5.0.  Total cholesterol 181, triglycerides 157, HDL 74, LDL 81.  A1c 5.9%.  Review of Systems  Cardiovascular:  Positive for dyspnea on exertion. Negative for chest pain and leg swelling.   Physical Exam:   VS:  BP (!) 170/98   Pulse 68   Ht 5\' 4"  (1.626 m)   Wt 147 lb 3.2 oz (66.8 kg)   SpO2 97%   BMI 25.27 kg/m    Wt Readings from Last 3 Encounters:  01/31/24 147 lb 3.2 oz (66.8 kg)  01/20/24 149 lb (67.6 kg)  11/16/23 147 lb 3.2 oz (66.8 kg)    Physical Exam Neck:     Vascular: Carotid bruit (bilateral) present. No JVD.  Cardiovascular:     Rate and Rhythm: Normal rate and regular rhythm.     Pulses: Intact distal pulses.          Popliteal pulses are 1+ on the  right side and 1+ on the left side.       Dorsalis pedis pulses are 2+ on the right side and 2+ on the left side.       Posterior tibial pulses are 0 on the right side and 0 on the left side.     Heart sounds: Normal heart sounds. No murmur heard.    No gallop.  Pulmonary:     Effort: Pulmonary effort is normal.     Breath sounds: Rhonchi (Right base >>left chronic, half way up) present.  Abdominal:     General: Bowel sounds are normal.     Palpations: Abdomen is soft.  Musculoskeletal:     Right lower leg: No edema.     Left lower leg: No edema.    Studies Reviewed: Aaron Aas    Coronary Angiography MI in 1999; Stent in right coronary artery in 1999; MI in 2001; Patent stent at that time   ECHOCARDIOGRAM COMPLETE 08/05/2023  1. Left ventricular ejection fraction, by estimation, is 60 to 65%. The left ventricle has normal function. The left ventricle has no regional wall motion abnormalities. Left ventricular diastolic parameters are consistent with Grade II diastolic dysfunction (pseudonormalization). The average left ventricular global longitudinal strain is -24.2 %. The global longitudinal strain is normal. 2. Right ventricular systolic function is normal.  The right ventricular size is normal. There is mildly elevated pulmonary artery systolic pressure. The estimated right ventricular systolic pressure is 37.1 mmHg. 3. Left atrial size was moderately dilated. 4. The mitral valve is normal in structure. Trivial mitral valve regurgitation. No evidence of mitral stenosis. 5. The aortic valve is tricuspid. Aortic valve regurgitation is not visualized. Aortic valve sclerosis/calcification is present, without any evidence of aortic stenosis. 6. The inferior vena cava is normal in size with greater than 50% respiratory variability, suggesting right atrial pressure of 3 mmHg.  Carotid artery duplex 01/12/2024: Bilateral ICA 1 to 39% stenosis. Bilateral antegrade vertebral artery flow. Patent left  subclavian stent. EKG:         Medications and allergies    Allergies  Allergen Reactions   Bactrim [Sulfamethoxazole-Trimethoprim] Anaphylaxis   Sulfa Antibiotics Anaphylaxis   Crestor [Rosuvastatin] Cough    "deathly ill"   Ibuprofen Nausea And Vomiting   Ivp Dye [Iodinated Contrast Media] Nausea And Vomiting and Other (See Comments)    "I may have had a rash too" "They normally give me benadryl  and steroids before contrast dye"   Lipitor [Atorvastatin] Other (See Comments)    Legs hurt so bad she couldn't walk   Macrodantin Nausea And Vomiting    faint   Naprosyn [Naproxen] Nausea Only and Other (See Comments)    faint   Niaspan [Niacin Er (Antihyperlipidemic)] Other (See Comments)    Flushing, headache   Vioxx [Rofecoxib] Other (See Comments)    "deathly ill"   Zetia  [Ezetimibe ] Other (See Comments)    Deathly ill    Bacitracin     Other reaction(s): Unknown   Colesevelam Hcl     Other reaction(s): Unknown   Fluvastatin Sodium     Other reaction(s): Unknown   Meloxicam     Can't remember   Tramadol  Hcl Nausea Only   Ciprofloxacin Rash   Clarithromycin Rash   Neosporin [Neomycin-Bacitracin Zn-Polymyx] Rash   Phenergan [Promethazine Hcl] Rash   Pletal [Cilostazol] Other (See Comments)    headache   Ramipril Cough     Current Outpatient Medications:    amLODipine  (NORVASC ) 5 MG tablet, Take 1 tablet (5 mg total) by mouth daily., Disp: 30 tablet, Rfl: 2   aspirin  81 MG tablet, Take 81 mg by mouth daily., Disp: , Rfl:    bisoprolol  (ZEBETA ) 10 MG tablet, Take 1 tablet by mouth once daily, Disp: 90 tablet, Rfl: 3   cetirizine (ZYRTEC) 10 MG tablet, Take 10 mg by mouth daily as needed for allergies., Disp: , Rfl:    Cholecalciferol  (VITAMIN D ) 2000 UNITS CAPS, Take 2,000 Units by mouth daily., Disp: , Rfl:    Coenzyme Q10 (COQ10) 200 MG CAPS, Take 1 capsule by mouth daily., Disp: , Rfl:    Cyanocobalamin (VITAMIN B12) 1000 MCG TBCR, Take 1 tablet by mouth daily.,  Disp: , Rfl:    inclisiran (LEQVIO ) 284 MG/1.5ML SOSY injection, Inject 284 mg into the skin once., Disp: , Rfl:    ipratropium (ATROVENT) 0.03 % nasal spray, SMARTSIG:2 Spray(s) Both Nares Twice Daily PRN, Disp: , Rfl:    losartan  (COZAAR ) 25 MG tablet, Take 1 tablet (25 mg total) by mouth every evening., Disp: 90 tablet, Rfl: 3   Omega-3 Fatty Acids (FISH OIL) 1000 MG CAPS, Take 1 capsule by mouth once a week., Disp: , Rfl:    pantoprazole (PROTONIX) 40 MG tablet, Take 40 mg by mouth daily., Disp: , Rfl:    predniSONE (DELTASONE) 5 MG tablet,  Take 2.5 mg by mouth daily., Disp: , Rfl:    Probiotic Product (PROBIOTIC DAILY PO), Take 1 capsule by mouth daily., Disp: , Rfl:    Pyridoxine HCl (VITAMIN B6) 250 MG TABS, Take 1 tablet by mouth daily., Disp: , Rfl:    Meds ordered this encounter  Medications   amLODipine  (NORVASC ) 5 MG tablet    Sig: Take 1 tablet (5 mg total) by mouth daily.    Dispense:  30 tablet    Refill:  2     There are no discontinued medications.   ASSESSMENT AND PLAN: .      ICD-10-CM   1. Coronary artery disease involving native coronary artery of native heart without angina pectoris  I25.10     2. Asymptomatic bilateral carotid artery stenosis  I65.23     3. Primary hypertension  I10 amLODipine  (NORVASC ) 5 MG tablet    4. Statin myopathy  G72.0    T46.6X5A       Assessment and Plan  1. Coronary artery disease involving native coronary artery of native heart without angina pectoris Patient remains asymptomatic and is on appropriate medical therapy including the beta-blocker therapy, ARB and also Leqvio  for lipids.  2. Asymptomatic bilateral carotid artery stenosis I reviewed the results of the recently performed carotid artery duplex, she has very mild carotid stenosis, no further evaluation is indicated.  She is on appropriate medical therapy as well.  3. PAD (peripheral artery disease) (HCC) Pedal pulses are preserved, she denies any symptoms of  claudication.  Continue aspirin .  4. Primary hypertension Blood pressure is markedly elevated.  However patient states that her blood pressure is usually normal at work and brings home recording which was very well-controlled.  I have added 5 mg amlodipine , she has an appointment to see Dr. Pete Brand, dose can be adjusted or discontinued depending upon her response.  5. Statin myopathy Presently on Leqvio , continue the same.  In spite of being on Leqvio  her LDL is still not at target, unfortunately Nexletol additional be expensive.  Fortunately she has remained stable over the years, continue present medical management.  I will see her back in a year or sooner if problems.   Signed,  Knox Perl, MD, [facility] 01/31/2024, 1:45 PM [facility] [address] [place] [address] [place], [place] [address] Phone: [phone]. Fax:  [phone]

## 2024-01-31 NOTE — Patient Instructions (Signed)
 Medication Instructions:  Your physician has recommended you make the following change in your medication:  Start amlodipine  5 mg by mouth daily   *If you need a refill on your cardiac medications before your next appointment, please call your pharmacy*  Lab Work: none If you have labs (blood work) drawn today and your tests are completely normal, you will receive your results only by: MyChart Message (if you have MyChart) OR A paper copy in the mail If you have any lab test that is abnormal or we need to change your treatment, we will call you to review the results.  Testing/Procedures: none  Follow-Up: At [facility], you and your health needs are our priority.  As part of our continuing mission to provide you with exceptional heart care, our providers are all part of one team.  This team includes your primary Cardiologist (physician) and Advanced Practice Providers or APPs (Physician Assistants and Nurse Practitioners) who all work together to provide you with the care you need, when you need it.  Your next appointment:   12 month(s)  Provider:   Knox Perl, MD     We recommend signing up for the patient portal called "MyChart".  Sign up information is provided on this After Visit Summary.  MyChart is used to connect with patients for Virtual Visits (Telemedicine).  Patients are able to view lab/test results, encounter notes, upcoming appointments, etc.  Non-urgent messages can be sent to your provider as well.   To learn more about what you can do with MyChart, go to [email or web address].   Other Instructions       1st Floor: - Lobby - Registration  - Pharmacy  - Lab - Cafe  2nd Floor: - PV Lab - Diagnostic Testing (echo, CT, nuclear med)  3rd Floor: - Vacant  4th Floor: - TCTS (cardiothoracic surgery) - AFib Clinic - Structural Heart Clinic - Vascular Surgery  - Vascular Ultrasound  5th Floor: - HeartCare Cardiology (general and EP) - Clinical  Pharmacy for coumadin, hypertension, lipid, weight-loss medications, and med management appointments    Valet parking services will be available as well.

## 2024-02-01 ENCOUNTER — Encounter: Payer: Self-pay | Admitting: Family Medicine

## 2024-02-10 ENCOUNTER — Ambulatory Visit (HOSPITAL_BASED_OUTPATIENT_CLINIC_OR_DEPARTMENT_OTHER): Admitting: Pulmonary Disease

## 2024-02-10 DIAGNOSIS — R0609 Other forms of dyspnea: Secondary | ICD-10-CM | POA: Diagnosis not present

## 2024-02-10 LAB — PULMONARY FUNCTION TEST
DL/VA % pred: 88 %
DL/VA: 3.64 ml/min/mmHg/L
DLCO cor % pred: 62 %
DLCO cor: 11.4 ml/min/mmHg
DLCO unc % pred: 62 %
DLCO unc: 11.4 ml/min/mmHg
FEF 25-75 Post: 0.96 L/s
FEF 25-75 Pre: 0.75 L/s
FEF2575-%Change-Post: 27 %
FEF2575-%Pred-Post: 70 %
FEF2575-%Pred-Pre: 55 %
FEV1-%Change-Post: 9 %
FEV1-%Pred-Post: 69 %
FEV1-%Pred-Pre: 63 %
FEV1-Post: 1.27 L
FEV1-Pre: 1.17 L
FEV1FVC-%Change-Post: 1 %
FEV1FVC-%Pred-Pre: 93 %
FEV6-%Change-Post: 7 %
FEV6-%Pred-Post: 77 %
FEV6-%Pred-Pre: 72 %
FEV6-Post: 1.83 L
FEV6-Pre: 1.7 L
FEV6FVC-%Pred-Post: 105 %
FEV6FVC-%Pred-Pre: 105 %
FVC-%Change-Post: 7 %
FVC-%Pred-Post: 73 %
FVC-%Pred-Pre: 68 %
FVC-Post: 1.83 L
FVC-Pre: 1.7 L
Post FEV1/FVC ratio: 70 %
Post FEV6/FVC ratio: 100 %
Pre FEV1/FVC ratio: 69 %
Pre FEV6/FVC Ratio: 100 %
RV % pred: 197 %
RV: 4.62 L
TLC % pred: 133 %
TLC: 6.54 L

## 2024-02-10 NOTE — Progress Notes (Signed)
 Full PFT Performed Today

## 2024-02-10 NOTE — Patient Instructions (Signed)
 Full PFT Performed Today

## 2024-02-16 ENCOUNTER — Ambulatory Visit: Payer: Medicare Other | Admitting: Pulmonary Disease

## 2024-02-21 DIAGNOSIS — I10 Essential (primary) hypertension: Secondary | ICD-10-CM | POA: Diagnosis not present

## 2024-02-21 DIAGNOSIS — M8589 Other specified disorders of bone density and structure, multiple sites: Secondary | ICD-10-CM | POA: Diagnosis not present

## 2024-02-21 DIAGNOSIS — R5383 Other fatigue: Secondary | ICD-10-CM | POA: Diagnosis not present

## 2024-02-21 DIAGNOSIS — M5416 Radiculopathy, lumbar region: Secondary | ICD-10-CM | POA: Diagnosis not present

## 2024-02-21 DIAGNOSIS — Z8739 Personal history of other diseases of the musculoskeletal system and connective tissue: Secondary | ICD-10-CM | POA: Diagnosis not present

## 2024-02-21 DIAGNOSIS — M316 Other giant cell arteritis: Secondary | ICD-10-CM | POA: Diagnosis not present

## 2024-02-21 DIAGNOSIS — M51369 Other intervertebral disc degeneration, lumbar region without mention of lumbar back pain or lower extremity pain: Secondary | ICD-10-CM | POA: Diagnosis not present

## 2024-02-21 DIAGNOSIS — N289 Disorder of kidney and ureter, unspecified: Secondary | ICD-10-CM | POA: Diagnosis not present

## 2024-02-21 DIAGNOSIS — M79645 Pain in left finger(s): Secondary | ICD-10-CM | POA: Diagnosis not present

## 2024-02-21 DIAGNOSIS — I2510 Atherosclerotic heart disease of native coronary artery without angina pectoris: Secondary | ICD-10-CM | POA: Diagnosis not present

## 2024-02-21 DIAGNOSIS — M1990 Unspecified osteoarthritis, unspecified site: Secondary | ICD-10-CM | POA: Diagnosis not present

## 2024-03-24 DIAGNOSIS — M810 Age-related osteoporosis without current pathological fracture: Secondary | ICD-10-CM | POA: Diagnosis not present

## 2024-03-28 ENCOUNTER — Encounter

## 2024-04-06 DIAGNOSIS — H524 Presbyopia: Secondary | ICD-10-CM | POA: Diagnosis not present

## 2024-04-06 DIAGNOSIS — H18593 Other hereditary corneal dystrophies, bilateral: Secondary | ICD-10-CM | POA: Diagnosis not present

## 2024-04-06 DIAGNOSIS — H52203 Unspecified astigmatism, bilateral: Secondary | ICD-10-CM | POA: Diagnosis not present

## 2024-04-06 DIAGNOSIS — Z961 Presence of intraocular lens: Secondary | ICD-10-CM | POA: Diagnosis not present

## 2024-04-13 ENCOUNTER — Ambulatory Visit: Admitting: Pulmonary Disease

## 2024-04-13 ENCOUNTER — Encounter: Payer: Self-pay | Admitting: Pulmonary Disease

## 2024-04-13 VITALS — BP 116/60 | Ht 64.0 in | Wt 147.0 lb

## 2024-04-13 DIAGNOSIS — R0609 Other forms of dyspnea: Secondary | ICD-10-CM | POA: Diagnosis not present

## 2024-04-13 DIAGNOSIS — J432 Centrilobular emphysema: Secondary | ICD-10-CM

## 2024-04-13 NOTE — Patient Instructions (Signed)
 Pulmonary function test largely reassuring there are some subtle abnormalities that may cause her shortness of breath  After shared decision making we agreed not add any medications at this time  Return to clinic as needed

## 2024-04-13 NOTE — Progress Notes (Signed)
 @Patient  ID: [name], female    DOB: [birth year], 83 y.o.   MRN: [id number]  Chief Complaint  Patient presents with   Follow-up    Referring provider: Gerome Brunet, DO  83 y.o. woman with significant coronary disease and cardiac disease whom we are seeing for evaluation of dyspnea on exertion, crackles on lung exam.  There is a cardiology note reviewed.  Returns for routine follow-up.  PFTs performed in interim, see below for full interpretation.  These show hyperinflation and air trapping.  Likely related to her emphysema.  We discussed this could be contributing to her dyspnea.  We discussed risk and benefits, side effect profile.  She declines trying these given our discussion.  Discussed could always try in the future if things worsen or she reconsiders.  HPI initial visit: Patient notes longstanding dyspnea on exertion.  Okay at rest but when moving around short of breath.  Cannot ride bike anymore.  Up and down stairs carrying things etc.  She notes that she has significant coronary disease has had stents in the past.  Stated that her cardiologist has noticed that she has abnormal lung sounds and this prompted referral.  In terms of dyspnea, note, day when things are better or worse.  No position to make things better or worse.  No seasonal or environmental factors she can identify to make things better or worse.  No alleviating or exacerbating factors.  She never diagnosed with asthma.  Has been told she has COPD has never had pulmonary function test.  Has not used inhalers at [facility].  Reviewed most recent chest imaging chest x-ray 09/2022 on my review interpretation reveals clear lungs bilaterally.  Her most recent echocardiogram in 2024 reveals diastolic function, left atrial dilation, trivial MVR, concern for elevated RVSP or pulmonary hypertension, mildly elevated 37.1 mmHg, normal right atrial size, normal right ventricular size and function.  Questionaires / Pulmonary  Flowsheets:   ACT:      No data to display          MMRC:     No data to display          Epworth:      No data to display          Tests:   FENO:  No results found for: NITRICOXIDE  PFT:    Latest Ref Rng & Units 02/10/2024   12:50 PM  PFT Results  FVC-Pre L 1.70   FVC-Predicted Pre % 68   FVC-Post L 1.83   FVC-Predicted Post % 73   Pre FEV1/FVC % % 69   Post FEV1/FCV % % 70   FEV1-Pre L 1.17   FEV1-Predicted Pre % 63   FEV1-Post L 1.27   DLCO uncorrected ml/min/mmHg 11.40   DLCO UNC% % 62   DLCO corrected ml/min/mmHg 11.40   DLCO COR %Predicted % 62   DLVA Predicted % 88   TLC L 6.54   TLC % Predicted % 133   RV % Predicted % 197   Personally reviewed interpreted as normal spirometry, no bronchodilator response, lung represented with hyperinflation and air trapping, DLCO was moderately reduced.  WALK:      No data to display          Imaging: Personally reviewed and as per EMR and discussion in this note No results found.   Lab Results: Personally reviewed CBC    Component Value Date/Time   WBC 5.2 10/07/2022 1621   RBC 4.22 10/07/2022 1621  HGB 10.4 (L) 10/07/2022 1621   HCT 34.0 (L) 10/07/2022 1621   PLT 326 10/07/2022 1621   MCV 80.6 10/07/2022 1621   MCH 24.6 (L) 10/07/2022 1621   MCHC 30.6 10/07/2022 1621   RDW 15.2 10/07/2022 1621   LYMPHSABS 0.5 (L) 10/07/2022 1621   MONOABS 0.6 10/07/2022 1621   EOSABS 0.0 10/07/2022 1621   BASOSABS 0.0 10/07/2022 1621    BMET    Component Value Date/Time   NA 131 (L) 10/07/2022 1621   NA 137 06/29/2022 0941   K 3.9 10/07/2022 1621   CL 98 10/07/2022 1621   CO2 23 10/07/2022 1621   GLUCOSE 86 10/07/2022 1621   BUN 19 10/07/2022 1621   BUN 16 06/29/2022 0941   CREATININE 1.30 (H) 10/07/2022 1621   CALCIUM 8.7 (L) 10/07/2022 1621   GFRNONAA 38 12/14/2023 0904   GFRAA  05/16/2008 0600    >60        The eGFR has been calculated using the MDRD equation. This calculation  has not been validated in all clinical    BNP No results found for: BNP  ProBNP No results found for: PROBNP  Specialty Problems       Pulmonary Problems   Centrilobular emphysema (HCC)    Allergies  Allergen Reactions   Bactrim [Sulfamethoxazole-Trimethoprim] Anaphylaxis   Sulfa Antibiotics Anaphylaxis   Crestor [Rosuvastatin] Cough    deathly ill   Ibuprofen Nausea And Vomiting   Ivp Dye [Iodinated Contrast Media] Nausea And Vomiting and Other (See Comments)    I may have had a rash too They normally give me benadryl  and steroids before contrast dye   Lipitor [Atorvastatin] Other (See Comments)    Legs hurt so bad she couldn't walk   Macrodantin Nausea And Vomiting    faint   Naprosyn [Naproxen] Nausea Only and Other (See Comments)    faint   Niaspan [Niacin Er (Antihyperlipidemic)] Other (See Comments)    Flushing, headache   Vioxx [Rofecoxib] Other (See Comments)    deathly ill   Zetia  [Ezetimibe ] Other (See Comments)    Deathly ill    Bacitracin     Other reaction(s): Unknown   Colesevelam Hcl     Other reaction(s): Unknown   Fluvastatin Sodium     Other reaction(s): Unknown   Meloxicam     Can't remember   Tramadol  Hcl Nausea Only   Ciprofloxacin Rash   Clarithromycin Rash   Neosporin [Neomycin-Bacitracin Zn-Polymyx] Rash   Phenergan [Promethazine Hcl] Rash   Pletal [Cilostazol] Other (See Comments)    headache   Ramipril Cough    Immunization History  Administered Date(s) Administered   Influenza-Unspecified 07/13/2015, 10/12/2018    Past Medical History:  Diagnosis Date   Anemia    Chronic bronchitis (HCC)    Coronary artery disease    Difficulty swallowing pills    Difficulty swallowing solids    GERD (gastroesophageal reflux disease)    H/O temporal arteritis 08/2014-01/2015   HA (headache)    related to temporal arteritis 08/2014-01/2015   Hypercholesteremia    Hypertension    Hypoxia    Kidney stones    Left carotid  artery stenosis    Myocardial infarct (HCC) 1999; 2001   On home oxygen  therapy    2L at night; don't always use it (11/12/2015)   Pneumonia 1970s X 1; ~ 2006; ~ 2014   PVD (peripheral vascular disease) (HCC)    Rheumatoid arthritis (HCC)    Syncope and collapse 10/05/2015  Tobacco History: Social History   Tobacco Use  Smoking Status Former   Current packs/day: 0.00   Average packs/day: 2.0 packs/day for 42.0 years (84.0 ttl pk-yrs)   Types: Cigarettes   Start date: 44   Quit date: 2001   Years since quitting: 24.5  Smokeless Tobacco Never  Tobacco Comments   Quit in 2000   Counseling given: Not Answered Tobacco comments: Quit in 2000   Continue to not smoke  Outpatient Encounter Medications as of 04/13/2024  Medication Sig   amLODipine  (NORVASC ) 5 MG tablet Take 1 tablet (5 mg total) by mouth daily.   aspirin  81 MG tablet Take 81 mg by mouth daily.   bisoprolol  (ZEBETA ) 10 MG tablet Take 1 tablet by mouth once daily   cetirizine (ZYRTEC) 10 MG tablet Take 10 mg by mouth daily as needed for allergies.   Cholecalciferol  (VITAMIN D ) 2000 UNITS CAPS Take 2,000 Units by mouth daily.   Coenzyme Q10 (COQ10) 200 MG CAPS Take 1 capsule by mouth daily.   Cyanocobalamin (VITAMIN B12) 1000 MCG TBCR Take 1 tablet by mouth daily.   inclisiran (LEQVIO ) 284 MG/1.5ML SOSY injection Inject 284 mg into the skin once.   ipratropium (ATROVENT) 0.03 % nasal spray SMARTSIG:2 Spray(s) Both Nares Twice Daily PRN   losartan  (COZAAR ) 25 MG tablet Take 1 tablet (25 mg total) by mouth every evening.   Omega-3 Fatty Acids (FISH OIL) 1000 MG CAPS Take 1 capsule by mouth once a week.   pantoprazole (PROTONIX) 40 MG tablet Take 40 mg by mouth daily.   predniSONE (DELTASONE) 5 MG tablet Take 2.5 mg by mouth daily.   Probiotic Product (PROBIOTIC DAILY PO) Take 1 capsule by mouth daily.   Pyridoxine HCl (VITAMIN B6) 250 MG TABS Take 1 tablet by mouth daily.   No facility-administered encounter  medications on file as of 04/13/2024.     Review of Systems  Review of Systems  N/a Physical Exam  BP 116/60 (BP Location: Left Arm, Patient Position: Sitting, Cuff Size: Normal)   Ht 5' 4 (1.626 m)   Wt 147 lb (66.7 kg)   SpO2 98%   BMI 25.23 kg/m   Wt Readings from Last 5 Encounters:  04/13/24 147 lb (66.7 kg)  02/10/24 143 lb 6.4 oz (65 kg)  01/31/24 147 lb 3.2 oz (66.8 kg)  01/20/24 149 lb (67.6 kg)  11/16/23 147 lb 3.2 oz (66.8 kg)    BMI Readings from Last 5 Encounters:  04/13/24 25.23 kg/m  02/10/24 25.40 kg/m  01/31/24 25.27 kg/m  01/20/24 26.39 kg/m  11/16/23 26.08 kg/m     Physical Exam General: Sitting in chair, no acute distress Eyes: EOMI, no icterus Neck: Supple, no JVP Pulmonary:  clear, normal work of breathing Cardiovascular: Warm, no edema Abdomen: Nondistended, bowel sounds present MSK: No synovitis, no joint effusion Neuro: Normal gait, no weakness Psych: Normal mood, full affect   Assessment & Plan:   Dyspnea on exertion: Suspect multifactorial.  Certainly there cardiac etiologies given her CAD, diastolic dysfunction, dilated left atrium, likely pulmonary hypertension large contributor WHO group 2.  She is has a 80-pack-year smoking history.  Emphysema seen on CT scan.  No ILD. Think she would benefit from dual bronchodilators in the setting emphysema and mild bronchiectasis seen on CT scan.  PFTs performed 02/2024 reveals air trapping and hyperinflation, consistent with emphysema.  Likely causing some dyspnea.  Given PFTs findings, discussed further rationale for consideration of trial of bronchodilators.  After shared decision making  she declines trial of inhalers after discussion of risks and benefits and side effect profile.  Bibasilar crackles: Mild on initial exam.  Not heard today.  Small area of atelectasis likely culprit on CT scan high-resolution without evidence of ILD.  No further follow-up or recommendations.  Return if symptoms  worsen or fail to improve, for f/u Dr. Annella.   Donnice JONELLE Annella, MD 04/13/2024

## 2024-05-01 ENCOUNTER — Ambulatory Visit: Payer: Medicare Other

## 2024-05-03 ENCOUNTER — Other Ambulatory Visit: Payer: Self-pay | Admitting: Cardiology

## 2024-05-03 DIAGNOSIS — I10 Essential (primary) hypertension: Secondary | ICD-10-CM

## 2024-05-08 ENCOUNTER — Ambulatory Visit (INDEPENDENT_AMBULATORY_CARE_PROVIDER_SITE_OTHER): Payer: Medicare Other

## 2024-05-08 VITALS — BP 103/57 | HR 60 | Temp 97.9°F | Resp 20 | Ht 64.0 in | Wt 146.4 lb

## 2024-05-08 DIAGNOSIS — E7800 Pure hypercholesterolemia, unspecified: Secondary | ICD-10-CM

## 2024-05-08 MED ORDER — INCLISIRAN SODIUM 284 MG/1.5ML ~~LOC~~ SOSY
284.0000 mg | PREFILLED_SYRINGE | Freq: Once | SUBCUTANEOUS | Status: AC
Start: 2024-05-08 — End: 2024-05-08
  Administered 2024-05-08: 284 mg via SUBCUTANEOUS
  Filled 2024-05-08: qty 1.5

## 2024-05-08 NOTE — Progress Notes (Signed)
 Diagnosis: Hypercholesterolemia   Provider:  Chilton Greathouse MD  Procedure: Injection  Leqvio (inclisiran), Dose: 284 mg, Site: subcutaneous, Number of injections: 1  Injection Site(s): Left lower quad. abdomen  Post Care: Patient declined observation  Discharge: Condition: Good, Destination: Home . AVS Provided  Performed by:  Wyvonne Lenz, RN

## 2024-06-13 DIAGNOSIS — N1832 Chronic kidney disease, stage 3b: Secondary | ICD-10-CM | POA: Diagnosis not present

## 2024-06-13 DIAGNOSIS — I129 Hypertensive chronic kidney disease with stage 1 through stage 4 chronic kidney disease, or unspecified chronic kidney disease: Secondary | ICD-10-CM | POA: Diagnosis not present

## 2024-06-13 DIAGNOSIS — I2510 Atherosclerotic heart disease of native coronary artery without angina pectoris: Secondary | ICD-10-CM | POA: Diagnosis not present

## 2024-06-13 DIAGNOSIS — I739 Peripheral vascular disease, unspecified: Secondary | ICD-10-CM | POA: Diagnosis not present

## 2024-06-13 DIAGNOSIS — R7303 Prediabetes: Secondary | ICD-10-CM | POA: Diagnosis not present

## 2024-06-13 DIAGNOSIS — Z79899 Other long term (current) drug therapy: Secondary | ICD-10-CM | POA: Diagnosis not present

## 2024-06-13 DIAGNOSIS — R7989 Other specified abnormal findings of blood chemistry: Secondary | ICD-10-CM | POA: Diagnosis not present

## 2024-06-15 DIAGNOSIS — H903 Sensorineural hearing loss, bilateral: Secondary | ICD-10-CM | POA: Diagnosis not present

## 2024-06-20 DIAGNOSIS — D649 Anemia, unspecified: Secondary | ICD-10-CM | POA: Diagnosis not present

## 2024-06-20 DIAGNOSIS — Z0000 Encounter for general adult medical examination without abnormal findings: Secondary | ICD-10-CM | POA: Diagnosis not present

## 2024-06-20 DIAGNOSIS — Z23 Encounter for immunization: Secondary | ICD-10-CM | POA: Diagnosis not present

## 2024-06-20 DIAGNOSIS — E559 Vitamin D deficiency, unspecified: Secondary | ICD-10-CM | POA: Diagnosis not present

## 2024-06-20 DIAGNOSIS — I2510 Atherosclerotic heart disease of native coronary artery without angina pectoris: Secondary | ICD-10-CM | POA: Diagnosis not present

## 2024-06-20 DIAGNOSIS — E7800 Pure hypercholesterolemia, unspecified: Secondary | ICD-10-CM | POA: Diagnosis not present

## 2024-06-20 DIAGNOSIS — N1832 Chronic kidney disease, stage 3b: Secondary | ICD-10-CM | POA: Diagnosis not present

## 2024-06-20 DIAGNOSIS — Z95828 Presence of other vascular implants and grafts: Secondary | ICD-10-CM | POA: Diagnosis not present

## 2024-06-20 DIAGNOSIS — I129 Hypertensive chronic kidney disease with stage 1 through stage 4 chronic kidney disease, or unspecified chronic kidney disease: Secondary | ICD-10-CM | POA: Diagnosis not present

## 2024-06-20 DIAGNOSIS — R7303 Prediabetes: Secondary | ICD-10-CM | POA: Diagnosis not present

## 2024-06-20 DIAGNOSIS — N390 Urinary tract infection, site not specified: Secondary | ICD-10-CM | POA: Diagnosis not present

## 2024-06-20 DIAGNOSIS — I2729 Other secondary pulmonary hypertension: Secondary | ICD-10-CM | POA: Diagnosis not present

## 2024-08-01 DIAGNOSIS — H903 Sensorineural hearing loss, bilateral: Secondary | ICD-10-CM | POA: Diagnosis not present

## 2024-08-04 DIAGNOSIS — Z1211 Encounter for screening for malignant neoplasm of colon: Secondary | ICD-10-CM | POA: Diagnosis not present

## 2024-08-04 DIAGNOSIS — Z1212 Encounter for screening for malignant neoplasm of rectum: Secondary | ICD-10-CM | POA: Diagnosis not present

## 2024-08-12 LAB — COLOGUARD: COLOGUARD: NEGATIVE

## 2024-08-15 ENCOUNTER — Other Ambulatory Visit: Payer: Self-pay | Admitting: Family Medicine

## 2024-08-15 ENCOUNTER — Other Ambulatory Visit: Payer: Self-pay | Admitting: Cardiology

## 2024-08-15 DIAGNOSIS — I10 Essential (primary) hypertension: Secondary | ICD-10-CM

## 2024-08-15 DIAGNOSIS — Z1231 Encounter for screening mammogram for malignant neoplasm of breast: Secondary | ICD-10-CM

## 2024-08-15 DIAGNOSIS — I2510 Atherosclerotic heart disease of native coronary artery without angina pectoris: Secondary | ICD-10-CM

## 2024-08-23 DIAGNOSIS — M316 Other giant cell arteritis: Secondary | ICD-10-CM | POA: Diagnosis not present

## 2024-08-23 DIAGNOSIS — M1990 Unspecified osteoarthritis, unspecified site: Secondary | ICD-10-CM | POA: Diagnosis not present

## 2024-08-23 DIAGNOSIS — Z7952 Long term (current) use of systemic steroids: Secondary | ICD-10-CM | POA: Diagnosis not present

## 2024-08-23 DIAGNOSIS — E559 Vitamin D deficiency, unspecified: Secondary | ICD-10-CM | POA: Diagnosis not present

## 2024-08-23 DIAGNOSIS — M810 Age-related osteoporosis without current pathological fracture: Secondary | ICD-10-CM | POA: Diagnosis not present

## 2024-09-01 ENCOUNTER — Other Ambulatory Visit: Payer: Self-pay | Admitting: Cardiology

## 2024-09-01 DIAGNOSIS — I10 Essential (primary) hypertension: Secondary | ICD-10-CM

## 2024-09-19 ENCOUNTER — Ambulatory Visit
Admission: RE | Admit: 2024-09-19 | Discharge: 2024-09-19 | Disposition: A | Source: Ambulatory Visit | Attending: Family Medicine | Admitting: Family Medicine

## 2024-09-19 DIAGNOSIS — Z1231 Encounter for screening mammogram for malignant neoplasm of breast: Secondary | ICD-10-CM | POA: Diagnosis not present

## 2024-10-19 ENCOUNTER — Telehealth: Payer: Self-pay

## 2024-10-19 NOTE — Telephone Encounter (Signed)
 Auth Submission: NO AUTH NEEDED Site of care: Site of care: [facility] Payer: Medicare A/B with BCBS FEP Medication & CPT/J Code(s) submitted: Leqvio  (Inclisiran) J1306 Diagnosis Code:  Route of submission (phone, fax, portal):  Phone # Fax # Auth type: Buy/Bill PB Units/visits requested: 284mg  x 2 doses Reference number:  Approval from: 10/19/24 to 11/11/25

## 2024-11-09 ENCOUNTER — Ambulatory Visit (INDEPENDENT_AMBULATORY_CARE_PROVIDER_SITE_OTHER)

## 2024-11-09 VITALS — BP 103/54 | HR 59 | Temp 97.6°F | Resp 18 | Ht 64.0 in | Wt 145.4 lb

## 2024-11-09 DIAGNOSIS — E7800 Pure hypercholesterolemia, unspecified: Secondary | ICD-10-CM

## 2024-11-09 MED ORDER — INCLISIRAN SODIUM 284 MG/1.5ML ~~LOC~~ SOSY
284.0000 mg | PREFILLED_SYRINGE | Freq: Once | SUBCUTANEOUS | Status: AC
  Administered 2024-11-09: 284 mg via SUBCUTANEOUS
  Filled 2024-11-09: qty 1.5

## 2024-11-09 NOTE — Progress Notes (Signed)
 Diagnosis: , Hyperlipidemia  Provider:  Mannam, Praveen MD  Procedure: Injection  Leqvio  (inclisiran), Dose: 284 mg, Site: subcutaneous, Number of injections: 1  Injection Site(s): Left lower quad. abdomen  Post Care:    Discharge: Condition: Good, Destination: Home . AVS Provided  Performed by:  Braidan Ricciardi, RN

## 2025-05-08 ENCOUNTER — Ambulatory Visit
